# Patient Record
Sex: Female | Born: 1935 | Race: Black or African American | Hispanic: No | Smoking: Never smoker
Health system: Southern US, Community
[De-identification: ages and names within clinical notes are randomized; demographics above are authoritative.]

## PROBLEM LIST (undated history)

## (undated) DIAGNOSIS — D649 Anemia, unspecified: Secondary | ICD-10-CM

## (undated) DIAGNOSIS — J449 Chronic obstructive pulmonary disease, unspecified: Secondary | ICD-10-CM

## (undated) DIAGNOSIS — N200 Calculus of kidney: Secondary | ICD-10-CM

## (undated) DIAGNOSIS — I10 Essential (primary) hypertension: Secondary | ICD-10-CM

## (undated) DIAGNOSIS — R7303 Prediabetes: Secondary | ICD-10-CM

## (undated) DIAGNOSIS — E785 Hyperlipidemia, unspecified: Secondary | ICD-10-CM

## (undated) DIAGNOSIS — R011 Cardiac murmur, unspecified: Secondary | ICD-10-CM

## (undated) DIAGNOSIS — I509 Heart failure, unspecified: Secondary | ICD-10-CM

## (undated) DIAGNOSIS — K219 Gastro-esophageal reflux disease without esophagitis: Secondary | ICD-10-CM

## (undated) DIAGNOSIS — G4730 Sleep apnea, unspecified: Secondary | ICD-10-CM

## (undated) DIAGNOSIS — I499 Cardiac arrhythmia, unspecified: Secondary | ICD-10-CM

## (undated) DIAGNOSIS — M545 Low back pain, unspecified: Secondary | ICD-10-CM

## (undated) DIAGNOSIS — M1990 Unspecified osteoarthritis, unspecified site: Secondary | ICD-10-CM

## (undated) DIAGNOSIS — G459 Transient cerebral ischemic attack, unspecified: Secondary | ICD-10-CM

## (undated) DIAGNOSIS — G5600 Carpal tunnel syndrome, unspecified upper limb: Secondary | ICD-10-CM

## (undated) HISTORY — DX: Hyperlipidemia, unspecified: E78.5

## (undated) HISTORY — DX: Anemia, unspecified: D64.9

## (undated) HISTORY — DX: Unspecified osteoarthritis, unspecified site: M19.90

## (undated) HISTORY — DX: Cardiac murmur, unspecified: R01.1

## (undated) HISTORY — DX: Low back pain, unspecified: M54.50

## (undated) HISTORY — DX: Heart failure, unspecified: I50.9

## (undated) HISTORY — PX: RENAL ARTERY STENT: SHX2321

## (undated) HISTORY — PX: CARDIAC CATHETERIZATION: SHX172

## (undated) HISTORY — DX: Calculus of kidney: N20.0

## (undated) HISTORY — DX: Sleep apnea, unspecified: G47.30

## (undated) HISTORY — PX: REPLACEMENT TOTAL KNEE BILATERAL: SUR1225

## (undated) HISTORY — DX: Gastro-esophageal reflux disease without esophagitis: K21.9

## (undated) HISTORY — DX: Transient cerebral ischemic attack, unspecified: G45.9

## (undated) HISTORY — PX: TONSILLECTOMY: SUR1361

## (undated) HISTORY — PX: BACK SURGERY: SHX140

## (undated) HISTORY — DX: Essential (primary) hypertension: I10

## (undated) HISTORY — DX: Cardiac arrhythmia, unspecified: I49.9

## (undated) HISTORY — DX: Low back pain: M54.5

## (undated) HISTORY — DX: Carpal tunnel syndrome, unspecified upper limb: G56.00

## (undated) HISTORY — PX: ABDOMINAL HYSTERECTOMY: SHX81

---

## 2004-04-08 ENCOUNTER — Other Ambulatory Visit: Payer: Self-pay

## 2005-01-21 ENCOUNTER — Ambulatory Visit: Payer: Self-pay

## 2005-03-23 ENCOUNTER — Other Ambulatory Visit: Payer: Self-pay

## 2005-03-23 ENCOUNTER — Emergency Department: Payer: Self-pay | Admitting: Emergency Medicine

## 2006-01-17 ENCOUNTER — Other Ambulatory Visit: Payer: Self-pay

## 2006-01-17 ENCOUNTER — Emergency Department: Payer: Self-pay | Admitting: Unknown Physician Specialty

## 2006-05-18 ENCOUNTER — Ambulatory Visit: Payer: Self-pay | Admitting: Otolaryngology

## 2006-06-02 ENCOUNTER — Emergency Department: Payer: Self-pay | Admitting: Emergency Medicine

## 2006-06-16 ENCOUNTER — Ambulatory Visit: Payer: Self-pay | Admitting: Internal Medicine

## 2006-06-19 ENCOUNTER — Ambulatory Visit: Payer: Self-pay | Admitting: Internal Medicine

## 2006-07-10 ENCOUNTER — Ambulatory Visit: Payer: Self-pay | Admitting: Vascular Surgery

## 2006-08-07 ENCOUNTER — Ambulatory Visit: Payer: Self-pay | Admitting: Gastroenterology

## 2006-09-25 ENCOUNTER — Ambulatory Visit: Payer: Self-pay | Admitting: Gastroenterology

## 2007-02-05 ENCOUNTER — Ambulatory Visit: Payer: Self-pay | Admitting: Vascular Surgery

## 2007-03-17 ENCOUNTER — Emergency Department: Payer: Self-pay | Admitting: General Practice

## 2007-08-21 ENCOUNTER — Ambulatory Visit: Payer: Self-pay | Admitting: General Practice

## 2007-10-15 ENCOUNTER — Ambulatory Visit: Payer: Self-pay | Admitting: Pain Medicine

## 2007-10-30 ENCOUNTER — Ambulatory Visit: Payer: Self-pay | Admitting: Pain Medicine

## 2007-11-13 ENCOUNTER — Ambulatory Visit: Payer: Self-pay | Admitting: Physician Assistant

## 2007-12-03 ENCOUNTER — Ambulatory Visit: Payer: Self-pay | Admitting: Pain Medicine

## 2007-12-11 ENCOUNTER — Ambulatory Visit: Payer: Self-pay | Admitting: Pain Medicine

## 2008-01-10 ENCOUNTER — Ambulatory Visit: Payer: Self-pay | Admitting: Pain Medicine

## 2008-01-29 ENCOUNTER — Ambulatory Visit: Payer: Self-pay | Admitting: Physician Assistant

## 2008-02-05 ENCOUNTER — Ambulatory Visit: Payer: Self-pay | Admitting: Family Medicine

## 2008-05-05 ENCOUNTER — Other Ambulatory Visit: Payer: Self-pay

## 2008-05-05 ENCOUNTER — Emergency Department: Payer: Self-pay | Admitting: Emergency Medicine

## 2009-04-15 ENCOUNTER — Inpatient Hospital Stay: Payer: Self-pay | Admitting: *Deleted

## 2009-08-27 ENCOUNTER — Inpatient Hospital Stay: Payer: Self-pay | Admitting: Specialist

## 2009-09-23 ENCOUNTER — Inpatient Hospital Stay: Payer: Self-pay | Admitting: Internal Medicine

## 2009-09-27 ENCOUNTER — Inpatient Hospital Stay: Payer: Self-pay | Admitting: Internal Medicine

## 2009-09-27 ENCOUNTER — Ambulatory Visit: Payer: Self-pay | Admitting: Internal Medicine

## 2009-10-30 ENCOUNTER — Other Ambulatory Visit: Payer: Self-pay | Admitting: Internal Medicine

## 2009-11-14 HISTORY — PX: OTHER SURGICAL HISTORY: SHX169

## 2009-12-15 ENCOUNTER — Ambulatory Visit: Payer: Self-pay | Admitting: Family Medicine

## 2010-01-05 ENCOUNTER — Ambulatory Visit (HOSPITAL_COMMUNITY): Admission: RE | Admit: 2010-01-05 | Discharge: 2010-01-05 | Payer: Self-pay | Admitting: Neurosurgery

## 2010-01-05 ENCOUNTER — Encounter (INDEPENDENT_AMBULATORY_CARE_PROVIDER_SITE_OTHER): Payer: Self-pay | Admitting: Neurosurgery

## 2010-01-08 ENCOUNTER — Encounter: Admission: RE | Admit: 2010-01-08 | Discharge: 2010-01-08 | Payer: Self-pay | Admitting: Neurology

## 2010-06-26 ENCOUNTER — Emergency Department: Payer: Self-pay | Admitting: Emergency Medicine

## 2010-06-29 ENCOUNTER — Emergency Department: Payer: Self-pay | Admitting: Emergency Medicine

## 2010-09-29 ENCOUNTER — Ambulatory Visit: Payer: Self-pay | Admitting: Family Medicine

## 2010-10-06 ENCOUNTER — Ambulatory Visit: Payer: Self-pay | Admitting: Family Medicine

## 2010-11-09 ENCOUNTER — Ambulatory Visit: Payer: Self-pay | Admitting: Surgery

## 2010-11-11 LAB — PATHOLOGY REPORT

## 2010-12-05 ENCOUNTER — Encounter: Payer: Self-pay | Admitting: Neurology

## 2011-02-03 LAB — BASIC METABOLIC PANEL
BUN: 17 mg/dL (ref 6–23)
CO2: 27 mEq/L (ref 19–32)
Calcium: 5.9 mg/dL — CL (ref 8.4–10.5)
Chloride: 106 mEq/L (ref 96–112)
GFR calc Af Amer: 50 mL/min — ABNORMAL LOW (ref >60)
GFR calc non Af Amer: 41 mL/min — ABNORMAL LOW (ref >60)
Glucose, Bld: 96 mg/dL (ref 70–99)
Potassium: 3.7 mEq/L (ref 3.5–5.1)
Potassium: 4.4 mEq/L (ref 3.5–5.1)
Sodium: 156 mEq/L — ABNORMAL HIGH (ref 135–145)

## 2011-02-03 LAB — CBC
MCHC: 33.4 g/dL (ref 30.0–36.0)
RBC: 3.67 MIL/uL — ABNORMAL LOW (ref 3.87–5.11)
WBC: 6.5 10*3/uL (ref 4.0–10.5)

## 2011-02-03 LAB — SURGICAL PCR SCREEN: Staphylococcus aureus: NEGATIVE

## 2011-07-01 ENCOUNTER — Other Ambulatory Visit: Payer: Self-pay | Admitting: Neurology

## 2011-07-01 DIAGNOSIS — R0989 Other specified symptoms and signs involving the circulatory and respiratory systems: Secondary | ICD-10-CM

## 2011-07-01 DIAGNOSIS — R51 Headache: Secondary | ICD-10-CM

## 2011-07-09 ENCOUNTER — Ambulatory Visit
Admission: RE | Admit: 2011-07-09 | Discharge: 2011-07-09 | Disposition: A | Discharge status: Home / Self Care | Payer: Medicare Other | Source: Ambulatory Visit | Attending: Neurology | Admitting: Neurology

## 2011-07-09 DIAGNOSIS — R51 Headache: Secondary | ICD-10-CM

## 2011-07-09 DIAGNOSIS — R0989 Other specified symptoms and signs involving the circulatory and respiratory systems: Secondary | ICD-10-CM

## 2011-08-05 ENCOUNTER — Encounter: Payer: Self-pay | Admitting: Cardiovascular Disease

## 2011-08-05 ENCOUNTER — Ambulatory Visit (INDEPENDENT_AMBULATORY_CARE_PROVIDER_SITE_OTHER): Payer: Medicare Other | Admitting: Cardiovascular Disease

## 2011-08-05 VITALS — BP 142/70 | HR 60 | Ht 64.0 in | Wt 213.0 lb

## 2011-08-05 DIAGNOSIS — E785 Hyperlipidemia, unspecified: Secondary | ICD-10-CM

## 2011-08-05 DIAGNOSIS — R0602 Shortness of breath: Secondary | ICD-10-CM

## 2011-08-05 DIAGNOSIS — I739 Peripheral vascular disease, unspecified: Secondary | ICD-10-CM

## 2011-08-05 DIAGNOSIS — I10 Essential (primary) hypertension: Secondary | ICD-10-CM

## 2011-08-05 DIAGNOSIS — I2510 Atherosclerotic heart disease of native coronary artery without angina pectoris: Secondary | ICD-10-CM

## 2011-08-05 MED ORDER — HYDRALAZINE HCL 50 MG PO TABS: 50.0000 mg | ORAL_TABLET | Freq: Three times a day (TID) | ORAL | Status: DC

## 2011-08-05 MED ORDER — LOSARTAN POTASSIUM 100 MG PO TABS: 100.0000 mg | ORAL_TABLET | Freq: Every day | ORAL | Status: DC

## 2011-08-05 MED ORDER — HYDROCHLOROTHIAZIDE 25 MG PO TABS: 25.0000 mg | ORAL_TABLET | Freq: Every day | ORAL | Status: DC

## 2011-08-05 NOTE — Assessment & Plan Note (Signed)
She does have some shortness of breath. This could be secondary to underlying deconditioning. Pulmonary pressures are not very elevated. Normal LV function. She likely has underlying diastolic dysfunction from her history of hypertension and underlying valve disease. No systolic dysfunction. She does report having CHF. Uncertain where this came from her she potentially does have mild diastolic CHF, improved with diuretic.

## 2011-08-05 NOTE — Patient Instructions (Signed)
You are doing well. Please take HCTZ every day Take losartan 1/2 every day. If blood pressure runs high, take a full losartan every day. For emergency, blood pressure greater than 160, take a 1/2 or full hydralazine pill Please call us if you have new issues that need to be addressed before your next appt.  We will call you for a follow up Appt. In 2 months

## 2011-08-05 NOTE — Assessment & Plan Note (Signed)
She tends to adjust her medications as she sees appropriate. She is not on amlodipine for 2 months. She takes losartan HCT sometimes every other day because her diastolic pressure is low. She feels she needs a diuretic daily but she does have polyuria. We have suggested we change the losartan HCT combination pill to a separate pill for each that she can adjust. We have suggested she stay on HCTZ daily. She will likely have mild fluid retention given her underlying mild valve heart disease. We have suggested she try losartan daily one half pill and adjust upwards to a full pill as needed for hypertension .  We have also given her an emergency pill for systolic pressure greater than 160. She can take hydralazine 50 mg either half pill or full pill depending on her pressure.

## 2011-08-05 NOTE — Assessment & Plan Note (Signed)
Notes indicate coronary artery disease. We'll try to obtain the records for our system.

## 2011-08-05 NOTE — Assessment & Plan Note (Signed)
We will try to obtain records of her renal artery stent. If in fact she does have peripheral vascular disease, we would encourage her to start a cholesterol medication.

## 2011-08-05 NOTE — Progress Notes (Signed)
Patient ID: Rose [name], female    DOB: [birth year], 77 y.o.   MRN: [id number]  HPI Comments: Rose [name] is a very pleasant 77 year old woman, patient of Dr. Burnett Sheng, with a history of hypertension, migraines, hyperlipidemia, sleep apnea, mild aortic valve stenosis with murmur, atrial fibrillation with ablation by Dr. Maisie Fus at [facility] in January 2011, renal artery stent who presents by referral for evaluation of her headaches and blood pressure.  Notes indicate she was evaluated by Dr. Lady Gary and had a stress test that showed no ischemia with normal perfusion, echocardiogram that showed normal LV systolic function with mild pulmonary hypertension, mild mitral valve stenosis, mild aortic valve insufficiency  ( gradient across the mitral valve was not recorded. There was minimal gradient across the aortic valve with mean pressure of 6 mmHg, aortic valve area estimated at 1.2 cm consistent with at least mild, possibly moderate aortic valve stenosis)  She reports that her blood pressure has been labile. She takes her losartan HCT sometimes every other day as she is worried about her low diastolic pressure. She likes to take the HCTZ every day as she feels she has congestive heart failure. Over the past week, her blood pressure has been well controlled with systolic pressures in the 130 range. She stopped her Norvasc 2 months ago and is uncertain if she told Dr. Burnett Sheng but this was discontinued. She has not been taking Topamax for migraines and this did not make her feel well. She is concerned when her blood pressure climbs. Sometimes blood pressure was elevated when she has a headache. She is not very active at baseline.  EKG shows normal sinus rhythm with rate 60 beats per minute with no significant ST or T wave changes   Outpatient Encounter Prescriptions as of 08/05/2011  Medication Sig Dispense Refill  . atenolol (TENORMIN) 50 MG tablet Take 50 mg by mouth 2 (two) times daily.        Marland Kitchen omeprazole  (PRILOSEC) 40 MG capsule Take 40 mg by mouth 2 (two) times daily.        Marland Kitchen  losartan-hydrochlorothiazide (HYZAAR) 100-25 MG per tablet Take 1 tablet by mouth daily.        . hydrALAZINE (APRESOLINE) 50 MG tablet Take 1 tablet (50 mg total) by mouth 3 (three) times daily.  30 tablet  6  . DISCONTD: amLODipine (NORVASC) 5 MG tablet Take 5 mg by mouth daily.           Review of Systems  Constitutional: Positive for fatigue.  Eyes: Negative.   Respiratory: Negative.   Cardiovascular: Negative.   Gastrointestinal: Negative.   Musculoskeletal: Negative.   Skin: Negative.   Neurological: Positive for dizziness and headaches.  Hematological: Negative.   Psychiatric/Behavioral: Negative.   All other systems reviewed and are negative.    BP 142/70  Pulse 60  Ht 5\' 4"  (1.626 m)  Wt 213 lb (96.616 kg)  BMI 36.56 kg/m2  Physical Exam  Nursing note and vitals reviewed. Constitutional: She is oriented to person, place, and time. She appears well-developed and well-nourished.  HENT:  Head: Normocephalic.  Nose: Nose normal.  Mouth/Throat: Oropharynx is clear and moist.  Eyes: Conjunctivae are normal. Pupils are equal, round, and reactive to light.  Neck: Normal range of motion. Neck supple. No JVD present.  Cardiovascular: Normal rate, regular rhythm, S1 normal, S2 normal and intact distal pulses.  Exam reveals no gallop and no friction rub.   Murmur heard.  Crescendo systolic murmur is present  with a grade of 2/6  Pulmonary/Chest: Effort normal and breath sounds normal. No respiratory distress. She has no wheezes. She has no rales. She exhibits no tenderness.  Abdominal: Soft. Bowel sounds are normal. She exhibits no distension. There is no tenderness.  Musculoskeletal: Normal range of motion. She exhibits no edema and no tenderness.  Lymphadenopathy:    She has no cervical adenopathy.  Neurological: She is alert and oriented to person, place, and time. Coordination normal.  Skin: Skin  is warm and dry. No rash noted. No erythema.  Psychiatric: She has a normal mood and affect. Her behavior is normal. Judgment and thought content normal.         Assessment and Plan

## 2011-08-05 NOTE — Assessment & Plan Note (Signed)
Per the notes, cholesterol is elevated. We'll discuss this with her next time whether she will benefit from a low-dose cholesterol medication.

## 2011-08-18 ENCOUNTER — Encounter: Payer: Self-pay | Admitting: Cardiovascular Disease

## 2011-10-10 ENCOUNTER — Ambulatory Visit: Payer: Medicare Other | Admitting: Cardiovascular Disease

## 2011-10-24 ENCOUNTER — Ambulatory Visit: Payer: Medicare Other | Admitting: Cardiovascular Disease

## 2011-12-12 ENCOUNTER — Ambulatory Visit: Payer: Self-pay | Admitting: Internal Medicine

## 2013-05-18 ENCOUNTER — Observation Stay: Payer: Self-pay | Admitting: Family Medicine

## 2013-05-18 LAB — URINALYSIS, COMPLETE
Blood: NEGATIVE
Glucose,UR: NEGATIVE mg/dL (ref 0–75)
Ketone: NEGATIVE
Nitrite: NEGATIVE
Ph: 5 (ref 4.5–8.0)
Protein: NEGATIVE
RBC,UR: <1 /HPF (ref 0–5)

## 2013-05-18 LAB — COMPREHENSIVE METABOLIC PANEL
Albumin: 3.4 g/dL (ref 3.4–5.0)
BUN: 15 mg/dL (ref 7–18)
Bilirubin,Total: 0.4 mg/dL (ref 0.2–1.0)
Calcium, Total: 9.1 mg/dL (ref 8.5–10.1)
Creatinine: 0.91 mg/dL (ref 0.60–1.30)
EGFR (Non-African Amer.): 59 — ABNORMAL LOW
Glucose: 110 mg/dL — ABNORMAL HIGH (ref 65–99)
Osmolality: 272 (ref 275–301)
SGOT(AST): 57 U/L — ABNORMAL HIGH (ref 15–37)
SGPT (ALT): 31 U/L (ref 12–78)

## 2013-05-18 LAB — CBC
MCH: 28.1 pg (ref 26.0–34.0)
MCHC: 33.6 g/dL (ref 32.0–36.0)
MCV: 84 fL (ref 80–100)
RBC: 4.23 10*6/uL (ref 3.80–5.20)
RDW: 15.1 % — ABNORMAL HIGH (ref 11.5–14.5)

## 2013-05-18 LAB — TROPONIN I: Troponin-I: <0.02 ng/mL

## 2013-05-18 LAB — PRO B NATRIURETIC PEPTIDE: B-Type Natriuretic Peptide: 2014 pg/mL — ABNORMAL HIGH (ref 0–450)

## 2013-05-19 LAB — CK TOTAL AND CKMB (NOT AT ARMC): CK, Total: 115 U/L (ref 21–215)

## 2013-05-19 LAB — BASIC METABOLIC PANEL
Calcium, Total: 8.6 mg/dL (ref 8.5–10.1)
EGFR (African American): 49 — ABNORMAL LOW
Potassium: 3.8 mmol/L (ref 3.5–5.1)
Sodium: 140 mmol/L (ref 136–145)

## 2013-05-19 LAB — TROPONIN I: Troponin-I: <0.02 ng/mL

## 2013-10-07 ENCOUNTER — Emergency Department: Payer: Self-pay | Admitting: Emergency Medicine

## 2013-10-07 LAB — URINALYSIS, COMPLETE
Bilirubin,UR: NEGATIVE
Blood: NEGATIVE
Glucose,UR: NEGATIVE mg/dL (ref 0–75)
Leukocyte Esterase: NEGATIVE
Ph: 5 (ref 4.5–8.0)
RBC,UR: 1 /HPF (ref 0–5)
Squamous Epithelial: 4

## 2013-10-07 LAB — BASIC METABOLIC PANEL
Creatinine: 0.91 mg/dL (ref 0.60–1.30)
EGFR (African American): >60
EGFR (Non-African Amer.): 59 — ABNORMAL LOW
Osmolality: 279 (ref 275–301)
Potassium: 3.6 mmol/L (ref 3.5–5.1)
Sodium: 138 mmol/L (ref 136–145)

## 2013-10-07 LAB — CBC
HCT: 35.5 % (ref 35.0–47.0)
HGB: 11.8 g/dL — ABNORMAL LOW (ref 12.0–16.0)
MCH: 27.4 pg (ref 26.0–34.0)
MCHC: 33.1 g/dL (ref 32.0–36.0)
Platelet: 198 10*3/uL (ref 150–440)
RBC: 4.28 10*6/uL (ref 3.80–5.20)
RDW: 15.3 % — ABNORMAL HIGH (ref 11.5–14.5)

## 2013-10-07 LAB — PRO B NATRIURETIC PEPTIDE: B-Type Natriuretic Peptide: 1798 pg/mL — ABNORMAL HIGH (ref 0–450)

## 2013-10-09 LAB — URINE CULTURE

## 2014-04-16 ENCOUNTER — Ambulatory Visit: Payer: Self-pay | Admitting: Family Medicine

## 2014-04-18 ENCOUNTER — Ambulatory Visit: Payer: Self-pay | Admitting: Family Medicine

## 2014-06-12 ENCOUNTER — Emergency Department: Payer: Self-pay | Admitting: Emergency Medicine

## 2014-06-12 LAB — CBC
HCT: 36.4 % (ref 35.0–47.0)
HGB: 11.6 g/dL — ABNORMAL LOW (ref 12.0–16.0)
MCH: 27.3 pg (ref 26.0–34.0)
MCHC: 31.8 g/dL — ABNORMAL LOW (ref 32.0–36.0)
MCV: 86 fL (ref 80–100)
Platelet: 212 10*3/uL (ref 150–440)
RBC: 4.24 10*6/uL (ref 3.80–5.20)
RDW: 14.7 % — ABNORMAL HIGH (ref 11.5–14.5)
WBC: 8.4 10*3/uL (ref 3.6–11.0)

## 2014-06-12 LAB — COMPREHENSIVE METABOLIC PANEL
ALK PHOS: 63 U/L
ALT: 21 U/L
Albumin: 3.3 g/dL — ABNORMAL LOW (ref 3.4–5.0)
Anion Gap: 7 (ref 7–16)
BUN: 20 mg/dL — AB (ref 7–18)
Bilirubin,Total: 0.2 mg/dL (ref 0.2–1.0)
CHLORIDE: 105 mmol/L (ref 98–107)
CO2: 26 mmol/L (ref 21–32)
CREATININE: 1.21 mg/dL (ref 0.60–1.30)
Calcium, Total: 8.4 mg/dL — ABNORMAL LOW (ref 8.5–10.1)
EGFR (African American): 48 — ABNORMAL LOW
EGFR (Non-African Amer.): 42 — ABNORMAL LOW
Glucose: 139 mg/dL — ABNORMAL HIGH (ref 65–99)
Osmolality: 281 (ref 275–301)
POTASSIUM: 3.7 mmol/L (ref 3.5–5.1)
SGOT(AST): 25 U/L (ref 15–37)
Sodium: 138 mmol/L (ref 136–145)
Total Protein: 7.5 g/dL (ref 6.4–8.2)

## 2014-06-12 LAB — PROTIME-INR
INR: 1
Prothrombin Time: 12.8 secs (ref 11.5–14.7)

## 2014-06-12 LAB — LIPASE, BLOOD: Lipase: 118 U/L (ref 73–393)

## 2014-06-12 LAB — TROPONIN I

## 2014-06-24 ENCOUNTER — Ambulatory Visit: Payer: Self-pay | Admitting: Gastroenterology

## 2014-12-13 ENCOUNTER — Emergency Department: Payer: Self-pay | Admitting: Internal Medicine

## 2014-12-13 LAB — CBC WITH DIFFERENTIAL/PLATELET
Basophil #: 0 10*3/uL (ref 0.0–0.1)
Basophil %: 0.3 %
EOS PCT: 1.1 %
Eosinophil #: 0.1 10*3/uL (ref 0.0–0.7)
HCT: 33.8 % — ABNORMAL LOW (ref 35.0–47.0)
HGB: 10.8 g/dL — ABNORMAL LOW (ref 12.0–16.0)
Lymphocyte #: 1.2 10*3/uL (ref 1.0–3.6)
Lymphocyte %: 17.7 %
MCH: 27 pg (ref 26.0–34.0)
MCHC: 32 g/dL (ref 32.0–36.0)
MCV: 85 fL (ref 80–100)
MONO ABS: 0.4 x10 3/mm (ref 0.2–0.9)
Monocyte %: 5.7 %
Neutrophil #: 5.3 10*3/uL (ref 1.4–6.5)
Neutrophil %: 75.2 %
PLATELETS: 199 10*3/uL (ref 150–440)
RBC: 4 10*6/uL (ref 3.80–5.20)
RDW: 15.4 % — AB (ref 11.5–14.5)
WBC: 7 10*3/uL (ref 3.6–11.0)

## 2014-12-13 LAB — COMPREHENSIVE METABOLIC PANEL
ANION GAP: 4 — AB (ref 7–16)
AST: 22 U/L (ref 15–37)
Albumin: 3.4 g/dL (ref 3.4–5.0)
Alkaline Phosphatase: 54 U/L (ref 46–116)
BUN: 19 mg/dL — ABNORMAL HIGH (ref 7–18)
Bilirubin,Total: 0.3 mg/dL (ref 0.2–1.0)
CALCIUM: 9 mg/dL (ref 8.5–10.1)
CHLORIDE: 103 mmol/L (ref 98–107)
Co2: 31 mmol/L (ref 21–32)
Creatinine: 1.21 mg/dL (ref 0.60–1.30)
EGFR (Non-African Amer.): 45 — ABNORMAL LOW
GFR CALC AF AMER: 55 — AB
GLUCOSE: 135 mg/dL — AB (ref 65–99)
Osmolality: 280 (ref 275–301)
Potassium: 4.3 mmol/L (ref 3.5–5.1)
SGPT (ALT): 21 U/L (ref 14–63)
Sodium: 138 mmol/L (ref 136–145)
Total Protein: 7.1 g/dL (ref 6.4–8.2)

## 2014-12-13 LAB — URINALYSIS, COMPLETE
BLOOD: NEGATIVE
Bilirubin,UR: NEGATIVE
Glucose,UR: NEGATIVE mg/dL (ref 0–75)
KETONE: NEGATIVE
Leukocyte Esterase: NEGATIVE
Nitrite: NEGATIVE
PH: 6 (ref 4.5–8.0)
Protein: NEGATIVE
RBC,UR: 1 /HPF (ref 0–5)
Specific Gravity: 1.012 (ref 1.003–1.030)
Squamous Epithelial: 2
WBC UR: 1 /HPF (ref 0–5)

## 2014-12-13 LAB — TROPONIN I: Troponin-I: <0.02 ng/mL

## 2014-12-13 LAB — LIPASE, BLOOD: Lipase: 78 U/L (ref 73–393)

## 2015-02-04 ENCOUNTER — Observation Stay: Payer: Self-pay | Admitting: Internal Medicine

## 2015-02-04 LAB — TROPONIN I
Troponin-I: <0.03 ng/mL
Troponin-I: <0.03 ng/mL

## 2015-02-05 LAB — CBC WITH DIFFERENTIAL/PLATELET
BASOS PCT: 0.3 %
Basophil #: 0 10*3/uL (ref 0.0–0.1)
EOS ABS: 0.1 10*3/uL (ref 0.0–0.7)
EOS PCT: 1.8 %
HCT: 34 % — AB (ref 35.0–47.0)
HGB: 10.9 g/dL — AB (ref 12.0–16.0)
Lymphocyte #: 1.6 10*3/uL (ref 1.0–3.6)
Lymphocyte %: 22.4 %
MCH: 27.1 pg (ref 26.0–34.0)
MCHC: 32.1 g/dL (ref 32.0–36.0)
MCV: 84 fL (ref 80–100)
MONO ABS: 0.5 x10 3/mm (ref 0.2–0.9)
Monocyte %: 7.2 %
NEUTROS ABS: 4.9 10*3/uL (ref 1.4–6.5)
Neutrophil %: 68.3 %
Platelet: 189 10*3/uL (ref 150–440)
RBC: 4.03 10*6/uL (ref 3.80–5.20)
RDW: 15.3 % — ABNORMAL HIGH (ref 11.5–14.5)
WBC: 7.2 10*3/uL (ref 3.6–11.0)

## 2015-02-05 LAB — BASIC METABOLIC PANEL
ANION GAP: 7 (ref 7–16)
BUN: 23 mg/dL — AB
CALCIUM: 9.1 mg/dL
Chloride: 101 mmol/L
Co2: 31 mmol/L
Creatinine: 1.05 mg/dL — ABNORMAL HIGH
EGFR (Non-African Amer.): 49 — ABNORMAL LOW
GFR CALC AF AMER: 57 — AB
GLUCOSE: 92 mg/dL
Potassium: 4.2 mmol/L
SODIUM: 139 mmol/L

## 2015-02-05 LAB — PROTIME-INR
INR: 1
PROTHROMBIN TIME: 13.8 s

## 2015-02-11 ENCOUNTER — Ambulatory Visit
Admit: 2015-02-11 | Disposition: A | Discharge status: Home / Self Care | Payer: Self-pay | Attending: Nurse Practitioner | Admitting: Nurse Practitioner

## 2015-03-06 NOTE — H&P (Signed)
PATIENT NAME:  Rose [name], Rose [name] MR#:  [id number] DATE OF BIRTH:  16-Jan-1930  DATE OF ADMISSION:  05/18/2013  PRIMARY CARE PHYSICIAN: Rhona Leavens. Burnett Sheng, MD  CHIEF COMPLAINT: Shortness of breath and chest pain.   HISTORY OF PRESENT ILLNESS: This is an 81 year old female who presents to the hospital with some progressive shortness of breath, getting worse over the past 3 days, along with some intermittent chest pain. The patient says that she has been short of breath where she has not been able to lie down flat to sleep over the past couple days. She does not complain of any lower extremity swelling or edema and cannot tell me if she truly has had any weight gain. She also complains of some intermittent chest pain with her shortness of breath. The chest pain is substernal in nature, nonradiating. Presently, she is chest pain-free. She denies any nausea or vomiting, any dizziness, any syncope, any palpitations or any other associated symptoms presently. The patient, in the ER, had a chest x-ray, and the findings are consistent with some pulmonary edema and some mild CHF. Hospitalist services were contacted for further treatment and evaluation.   REVIEW OF SYSTEMS:  CONSTITUTIONAL: No documented fever. No weight gain, no weight loss.  EYES: No blurry or double vision.  ENT: No tinnitus. No postnasal drip. No redness of the oropharynx.  RESPIRATORY: No cough, no wheeze. No hemoptysis. Positive dyspnea.  CARDIOVASCULAR: Positive chest pain. No orthopnea, no palpitations, no syncope.  GASTROINTESTINAL: No nausea, no vomiting, no diarrhea. No abdominal pain. No melena or hematochezia.  GENITOURINARY: No dysuria, no hematuria.  ENDOCRINE: No polyuria or nocturia. No heat or cold intolerance.  HEMATOLOGIC: No anemia, no bruising, no bleeding.  INTEGUMENTARY: No rashes, no lesions.  MUSCULOSKELETAL: No arthritis, no swelling, no gout.  NEUROLOGIC: No numbness or tingling. No ataxia. No seizure-type activity.   PSYCHIATRIC: No anxiety, no insomnia, no ADD.   PAST MEDICAL HISTORY: Consistent with history of diastolic congestive heart failure, hypertension, GERD.   ALLERGIES: LISINOPRIL, CRESTOR, AVAPRO, PRAVACHOL AND OTHER STATINS AND ULTRAM.   SOCIAL HISTORY: No smoking. No alcohol abuse. No illicit drug abuse. Lives at home with her son.   FAMILY HISTORY: Mother died from natural causes. Father had diabetes.   CURRENT MEDICATIONS:  1. Atenolol 50 mg b.i.d. 2. Aspirin 81 mg daily.  3. Garlic 1000 mg daily.  4. Hydrochlorothiazide 25 mg daily.  5. Omeprazole 40 mg daily.  6. Oxygen at night 7. Vitamin B12 one tab daily.  8. Vitamin C 500 mg daily.  9. Vitamin D2 at 50,000 international units weekly. 10. Zinc 50 mg daily.   PHYSICAL EXAMINATION: Presently, is as follows: VITAL SIGNS: Temperature is 98.4, pulse 64, respirations 18, blood pressure 177/71, saturation is 98% on room air.  GENERAL: The patient is a pleasant-appearing female in no apparent distress.  HEAD, EYES, EARS, NOSE AND THROAT: She is atraumatic, normocephalic. Extraocular muscles are intact. Pupils equal and reactive to light. Sclerae anicteric. No conjunctival injection. No pharyngeal erythema.  NECK: Supple. No jugular venous distention, no bruits, no lymphadenopathy, no thyromegaly.  HEART: Regular rate and rhythm. She does have a 2/6 systolic ejection murmur heard at the right sternal border. No rubs. No clicks.  LUNGS: She has some coarse crackles at the bases bilaterally; otherwise, negative use of accessory muscles. No dullness to percussion. No rhonchi. No wheezing.  ABDOMEN: Soft, flat, nontender, nondistended. Has good bowel sounds. No hepatosplenomegaly appreciated.  EXTREMITIES: No evidence of any cyanosis or  clubbing. Does have trace peripheral edema bilaterally. Has +2 pedal and radial pulses bilaterally.  NEUROLOGICAL: The patient is alert, awake and oriented x3 with no focal motor or sensory deficits  appreciated bilaterally.  SKIN: Moist and warm, with no rash appreciated.  LYMPHATIC: There is no cervical or axillary lymphadenopathy.   LABORATORY DATA: Showed a serum glucose of 110, BUN 15, creatinine 0.9, sodium 135, potassium 5.4, chloride 104, bicarbonate 25. LFTs are within normal limits. Troponin less than 0.02. White cell count 8.3, hemoglobin 11.9, hematocrit 35.4, platelet count 208. Urinalysis within normal limits. EKG shows normal sinus rhythm with normal axis and no evidence of any acute ST or T wave changes. Chest x-ray showed interstitial opacities consistent with interstitial edema.   ASSESSMENT AND PLAN: This is an 81 year old female with history of congestive heart failure, hypertension and gastroesophageal reflux disease, who presents to the hospital with progressive shortness of breath, some orthopnea and chest pain.   1. Congestive heart failure. This is likely the cause of the patient's orthopnea and shortness of breath. Will diurese the patient with IV Lasix. Follow I's and O's and daily weights. Continue her beta blockers. THE PATIENT IS ALLERGIC TO ACE INHIBITORS AS SHE GETS ANAPHYLAXIS. She follows up with Dr. Juliann Paresallwood from cardiology. If she clinically does not feel better, will consider consulting cardiology.  2. Chest pain. She does have significant risk factors for angina, although her chest pain is quite atypical. I will observe her overnight on telemetry. Follow serial cardiac markers. Continue aspirin, continue beta blocker. THE PATIENT IS CURRENTLY NOT ON STATINS AS SHE IS ALLERGIC TO THEM.  3. Hypertension. Continue atenolol. 4. Gastroesophageal reflux disease. Continue omeprazole.   CODE STATUS: The patient is a full code.   TIME SPENT ON ADMISSION: 45 minutes.   ____________________________ Rolly PancakeVivek J. Cherlynn KaiserSainani, MD vjs:OSi D: 05/18/2013 13:39:11 ET T: 05/18/2013 13:55:43 ET JOB#: [id number]  cc: Rolly PancakeVivek J. Cherlynn KaiserSainani, MD, <Dictator> Houston SirenVIVEK J Ashan Cueva  MD ELECTRONICALLY SIGNED 05/19/2013 16:45

## 2015-03-06 NOTE — Discharge Summary (Signed)
PATIENT NAME:  Rose [name], Rose L MR#:  [id number] DATE OF BIRTH:  19-Sep-1930  DATE OF ADMISSION:  05/18/2013 DATE OF DISCHARGE:  05/19/2013  REASON FOR ADMISSION: Shortness of breath and chest pain.   DISCHARGE DIAGNOSES: 1.  Congestive heart failure, acute exacerbation of diastolic dysfunction. Ejection fraction of  58% on previous stress test on 11/2011.  2.  Chest pain, secondary to congestive heart failure.  3.  Hypertension.  4.  Gastroesophageal reflux.  5.  Chronic kidney disease with glomerular filtration rate (GFR) around 40 to 50, stage III.  6.  Diastolic congestive heart failure exacerbation.   Important Results: BNP of 2000, glucose 110, sodium 135, potassium 5.4. At discharge potassium was 3.8.   AST is slightly elevated at 57. Troponins were negative x 3. CK 237 on admission, 150 on  discharge.  Hemoglobin 11.9 at admission.   White count 8.3, platelets 208.   UA negative for signs of urinary tract infection.   EKG: Normal sinus rhythm.   DISPOSITION: Home, with home health.   MEDICATIONS AT HOME: Home oxygen at night, 2 liters nasal cannula, vitamin B12 once a day, omeprazole 40 mg once a day, atenolol 50 mg twice daily, vitamin D 50,000 units once a week, garlic capsule once daily, vitamin C once daily, zinc once daily, aspirin 81 mg once daily, furosemide 10 mg p.o. once a day; take an extra one-half of the pill, another 10 mg if weight gain of more than 2 pounds in 1 day or more than 5 pounds in 1 week.   FOLLOWUP: With primary care physician, Dr. Tera MaterJim Hedrick in 1 to 2 weeks. Follow up with cardiologist, Dr. Juliann Paresallwood, also in 1 to 2 weeks.   Please Dr. Burnett ShengHedrick, check a kidney function on the patient on the followup appointment. The patient discharged with instructions of daily weights. Home health to help her understanding the CHF, preventing readmissions, modification of medications and also to increase ability to ambulate, and strength.   HOSPITAL COURSE: Rose [name]  is a very nice 81 year old female who has a history of congestive heart failure in the past. She was taking Lasix. The patient was taken off of it for awhile. She states that she had some progressive shortness of breath for the past 3 days prior to admission, with intermittent chest pains.   In the past she has had several stress tests done, the last one being on January 2013, which was negative for any reversible or irreversible defects.   The patient has been seen by Dr. Juliann Paresallwood in the past and these chest pains are apparently not necessarily cardiac or related to CHF in that sense.   The patient states her shortness of breath was worse whenever she was laying flat, for which  she had to sleep with several pillows. She noticed that she has increased swelling or edema of the lower extremities which she can tell, and she does not have a working scale and does not know if she has gained any weight. She is having intermittent chest pain with shortness of breath, mostly tightness-related, and they have been described as substernal, nonradiating, and they just come and go occasionally.   The patient was evaluated and admitted on her physical exam. She had a 2/6 systolic ejection murmur that has been there before. She had bilateral normal pedal pulses, with no significant cyanosis or clubbing. The patient had trace peripheral edema.   LABORATORY DATA: Her EKG was overall normal.   Chest x-ray showed interstitial  opacities consistent with interstitial edema. There was no ST elevation or depression on the EKG, and the cardiac enzymes were negative.   The patient was admitted with a plan to treat congestive heart failure exacerbation. The patient was given Lasix IV, and within the first couple of doses she noticed a significant difference.   She is taking a beta blocker that we continued. SHE HAS A HISTORY OF ANAPHYLAXIS WITH ACE INHIBITORS for which we did not prescribing any.   Her chest pain was mostly  related to the CHF and it did not occur again. Her cardiac enzymes were negative.   SHE IS ALLERGIC TO STATINS, which she cannot take.   For her hypertension she takes atenolol as a beta blocker, also congestive heart failure. She has a history of GERD that is well-controlled with omeprazole.   The patient was given medication as far as congestive heart failure. I spoke to the family, and she is being discharged in good condition on a small dose of Lasix.   I spent about 45 minutes with this patient.    ____________________________ Felipa Furnace, MD rsg:dm D: 05/20/2013 07:00:46 ET T: 05/20/2013 07:33:31 ET JOB#: [id number]  cc: Felipa Furnace, MD, <Dictator> Rhona Leavens. Burnett Sheng, MD Dwayne D. Juliann Pares, MD Regan Rakers Juanda Chance MD ELECTRONICALLY SIGNED 05/25/2013 16:38

## 2015-03-15 NOTE — Discharge Summary (Signed)
PATIENT NAME:  Rose [name], Rose [name] MR#:  [id number] DATE OF BIRTH:  09/15/1930  DATE OF ADMISSION:  02/04/2015 DATE OF DISCHARGE:  02/05/2015  REASON FOR ADMISSION:    1. Chest pain with no active cardiac disease on cardiac catheterization.  2. History of hypertension.   PROCEDURES: Cardiac catheterization showed coronary arteries essentially normal with minor irregularities.  No significant aortic or mitral valve stenosis. Catheter was done by Dr. Lady GaryFath. Ejection fraction was 60-65%.   FOLLOWUP:   With Dr. Juliann Paresallwood as outpatient.   MEDICATIONS AT DISCHARGE: 1. Atenolol 50 mg b.i.d.  2. Omeprazole 20 mg daily.  3. Prazosin 1 mg p.o. b.i.d.  4. Hydrochlorothiazide 25 mg p.o. daily.  5. Vitamin D3 at 1000 international units p.o. daily.  6. Cyclobenzaprine 5 mg, 1 tablet once a day at bedtime as needed for muscle spasms.  7. ProAir HFA 90 mcg per inhalation, 2 puffs inhaled every 4 hours as needed.  8. Aspirin 81 mg p.o. 3 times a week.  9. Garlic oral capsule once a day.  10. Multivitamin p.o. daily.   DIET: Low sodium, regular consistency.   PRIMARY CARE PHYSICIAN:  At [facility].   LABORATORY DATA AND IMAGING:   Cardiac enzymes x 3 were negative. Echo Doppler showed EF of 60-65% and mild mitral valve stenosis, mild  mitral valve regurgitation, thickening of anterior and posterior mitral valve leaflets, mild aortic regurgitation. Chest x-ray, stable exam. No evidence of acute cardiopulmonary disease.   ASSESSMENT:   Rose [name] is an 81 year old African American female with past medical history of hypertension, chronic kidney disease stage III, gastroesophageal reflux disease, came in with chest pain. She was admitted with:  1. Chest pain, which appears atypical but the patient does have risk factors of coronary artery disease and stress test had been negative in the past. She is scheduled for cardiac catheter, results as above were noted. The patient will continue her cardiac  medications as before.  Followup with Dr. Juliann Paresallwood as before.  2. Hypertension. Continue atenolol, hydrochlorothiazide.  3. History of congestive heart failure, clinically well compensated.  4. Gastroesophageal reflux disease. Continue Protonix.   Hospital stay otherwise remained stable.   CODE STATUS: The patient remained a full code.   TIME SPENT: Forty minutes.     ____________________________ Wylie HailSona A. Allena KatzPatel, MD sap:tr D: 02/06/2015 07:41:20 ET T: 02/06/2015 13:09:52 ET JOB#: [id number]  cc: Chloris Marcoux A. Allena KatzPatel, MD, <Dictator> Dwayne D. Juliann Paresallwood, MD [facility]  Willow OraSONA A Jesly Hartmann MD ELECTRONICALLY SIGNED 02/16/2015 12:24

## 2015-03-15 NOTE — H&P (Signed)
PATIENT NAME:  Rose [name], Rose [name] MR#:  [id number] DATE OF BIRTH:  1930-09-20  DATE OF ADMISSION:  02/04/2015  PRIMARY CARE PHYSICIAN: At the [facility].   CARDIOLOGIST: Dwayne D. Callwood, MD   CHIEF COMPLAINT: Chest pain.   HISTORY OF PRESENT ILLNESS: This is an 81 year old female who presents to the Emergency Room complaining of chest pain that began this morning. The patient says that she has not been feeling well now for the past few days. She describes just feeling more weak and malaise over the past few days. She went to see her primary care physician and was complaining to them about some atypical chest pain that she has also been having. They sent her to the ER for further evaluation. The patient describes the chest pain as being a dull ache in the center of her chest, nonradiating. Associated with some shortness of breath, but no nausea, no vomiting, no palpitations, no diaphoresis, no syncope. Hospitalist services were contacted for further treatment and evaluation.   REVIEW OF SYSTEMS: CONSTITUTIONAL: No documented fever. Positive fatigue and weakness. No weight gain. No weight loss.  EYES: No blurry or double vision.  ENT: No tinnitus. No postnasal drip. No redness of the oropharynx.  RESPIRATORY: No cough, no wheeze, no hemoptysis, no dyspnea.  CARDIOVASCULAR: Positive chest pain. No orthopnea. No palpitations. No syncope.  GASTROINTESTINAL: No nausea, no vomiting, diarrhea. No abdominal pain. No melena. No hematochezia.  GENITOURINARY: No dysuria or hematuria.  ENDOCRINE: No polyuria, nocturia, heat or cold intolerance.  HEMATOLOGIC: No anemia, no bruising, no bleeding.  INTEGUMENTARY: No rashes. No lesions.  MUSCULOSKELETAL: No arthritis. No swelling. No gout.  NEUROLOGIC: No numbness, tingling. No ataxia. No seizure-type activity.  PSYCHIATRIC: No anxiety. No insomnia. No ADD.   PAST MEDICAL HISTORY: Consistent with hypertension, chronic kidney disease stage III, GERD,  osteoarthritis, chronic back pain.   ALLERGIES: AVAPRO, CRESTOR, LISINOPRIL, PLAVIX, PRAVACHOL, PRILOSEC, ULTRAM, AND STATINS.   SOCIAL HISTORY: No smoking. No alcohol abuse. No illicit drug abuse. Lives at home with her son.   FAMILY HISTORY: Mother and father are both deceased. Mother died from natural causes. Father died from complications of diabetes.   CURRENT MEDICATIONS: As follows: Aspirin 81 mg 3 times a week, atenolol 50 mg b.i.d., Flexeril 5 mg at bedtime as needed, garlic supplement daily, hydrochlorothiazide 25 mg daily, multivitamin daily, omeprazole 20 mg a day, prazosin 1 mg b.i.d., albuterol inhaler 2 puffs every 4 hours as needed, vitamin D3 at 1000 international units daily.   PHYSICAL EXAMINATION: Presently is as follows:  VITAL SIGNS: Temperature is 98.2, pulse 63, respirations 12, blood pressure 136/61. Saturating 95% on room air.  GENERAL: The patient is a pleasant-appearing female but in no apparent distress.  HEAD, EYES, EARS, NOSE, THROAT: Atraumatic, normocephalic. Her extraocular muscles are intact. Her pupils are equal and reactive to light. Sclerae anicteric. No conjunctival injection. No pharyngeal erythema.  NECK: Supple. There is no jugular venous distention. No bruits, no lymphadenopathy, no thyromegaly.  HEART: Regular rate and rhythm. She does have a 2/6 systolic ejection murmur heard at the right sternal border. No rubs, no clicks.  LUNGS: Clear to auscultation bilaterally. No rales or rhonchi. No wheezes.  ABDOMEN: Soft, flat, nontender, nondistended. Has good bowel sounds. No hepatosplenomegaly appreciated.  EXTREMITIES: No evidence of any cyanosis, clubbing, or peripheral edema. Has +2 pedal and radial pulses bilaterally.  NEUROLOGIC: She is alert, awake, oriented x 3 with no focal motor or sensory deficits appreciated bilaterally.  SKIN: Moist  and warm with no rashes appreciated.  LYMPHATIC: There is no cervical or axillary lymphadenopathy.   LABORATORY  DATA: Serum glucose of 104, BUN is 25, creatinine 1.04, sodium 136, potassium 4.1, chloride 99, bicarbonate 31. The patient's LFTs are within normal limits. Troponin 0.01. The patient's white cell count is 8.4, hemoglobin 11.1, hematocrit 33.9, platelet count of 187,000.   ASSESSMENT AND PLAN: This is an 81 year old female with a history of hypertension, chronic kidney disease stage III, gastroesophageal reflux disease, osteoarthritis, chronic back pain presented to the hospital with chest pain.  1.  Chest pain. The patient's chest pain is quite atypical, but she does have some risk factors for coronary artery disease. Her EKG shows no acute ST or T wave changes. Her first set of cardiac markers are negative. For now observe her on telemetry, follow serial enzymes, continue aspirin and beta blockers. She is actually allergic to all statins. We will get a Myoview in the morning. Her last Myoview was done in the office last year and also she had a negative Myoview in 2013. We will also get a cardiology consult. Discuss the case with Dr. Lady Gary, if he believes that the patient would benefit from a cardiac catheterization instead of a Myoview. He will discuss those options with the patient.  2.  Hypertension, presently hemodynamically stable. Continue atenolol and HCTZ.  3.  History of congestive heart failure. Clinically, the patient does not appear in congestive heart failure. Has no shortness of breath. Continue with the beta blocker and HCTZ for now.   4.  Gastroesophageal reflux disease. Continue Protonix.   CODE STATUS: The patient is a full code.   TIME SPENT WITH THE ADMISSION: 50 minutes.    ____________________________ Rolly Pancake. Cherlynn Kaiser, MD vjs:at D: 02/04/2015 16:09:32 ET T: 02/04/2015 17:50:26 ET JOB#: [id number]  cc: Rolly Pancake. Cherlynn Kaiser, MD, <Dictator> Houston Siren MD ELECTRONICALLY SIGNED 02/19/2015 16:16

## 2015-03-15 NOTE — Consult Note (Signed)
   Present Illness Pt is an [age] African American female with history of episodes of chest pain over that past few years . She  has had negative funcitonal studies approximately every year. She had a functional study in 2014 which was negative. She states she currently has midsternal chest pain wich radiation to her arms. This occurs at rest and with exertion. She also has a systollic murmur and a history of aortic valve disease. She has ruled out for an mi thus far and ekg did not reveal significant ischemia. She has history of hypertension and ckd iii.   Physical Exam:  GEN no acute distress, obese   HEENT PERRL   NECK supple   RESP normal resp effort   CARD Regular rate and rhythm  Murmur   Murmur Systolic   Systolic Murmur Out flow   ABD denies tenderness   LYMPH negative neck, negative axillae   EXTR negative cyanosis/clubbing, negative edema   SKIN normal to palpation   NEURO cranial nerves intact, motor/sensory function intact   PSYCH A+O to time, place, person   Review of Systems:  Subjective/Chief Complaint chest pain and sob   General: Fatigue   Skin: No Complaints   ENT: No Complaints   Eyes: No Complaints   Neck: No Complaints   Respiratory: Short of breath   Cardiovascular: Chest pain or discomfort  Tightness   Gastrointestinal: No Complaints   Genitourinary: No Complaints   Vascular: No Complaints   Musculoskeletal: No Complaints   Neurologic: No Complaints   Hematologic: No Complaints   Endocrine: No Complaints   Psychiatric: No Complaints   Review of Systems: All other systems were reviewed and found to be negative   Medications/Allergies Reviewed Medications/Allergies reviewed   EKG:  EKG NSR   Abnormal NSSTTW changes    Lisinopril: Swelling  Statins: Pain  Ultram: GI Distress  Prilosec: Unknown  Crestor: Unknown  Avapro: Unknown  Plavix: Unknown  Pravachol: Unknown   Impression Pt with history of hypertension who was  admitted with midsternal chest pain with radiation to her arm. She had ruled out for an mi thus far. Pain has concerning features for angina. She also has a systollic murmur consistant with aortic valve disease. Had several negative funcitonal studies over the past few years. Pain has worsened and ocurs at rest. Will proceed with an echo to determine if there is any signifcant valvular dissease and then proceed iwht left cardiac cath to evaluate anatomy to guide further rherapy   Plan 1. Continue to rule out for mi on current meds 2. Echo to evaluate lv function and valves 3. Consider left cardiac cath to evaliuate anatomy to guide further therapy in a patient with rest chest pain 4. Futher recs after cath 5. Continue with asa, and nitreats   Electronic Signatures: Dalia HeadingFath, Devaughn Savant A (MD)  (Signed 23-Mar-16 19:28)  Authored: General Aspect/Present Illness, History and Physical Exam, Review of System, EKG , Allergies, Impression/Plan   Last Updated: 23-Mar-16 19:28 by Dalia HeadingFath, Chavie Kolinski A (MD)

## 2015-04-16 DIAGNOSIS — K5732 Diverticulitis of large intestine without perforation or abscess without bleeding: Secondary | ICD-10-CM | POA: Insufficient documentation

## 2015-05-21 ENCOUNTER — Encounter: Payer: Self-pay | Admitting: *Deleted

## 2015-05-22 ENCOUNTER — Encounter
Admission: RE | Disposition: A | Discharge status: Home / Self Care | Payer: Self-pay | Source: Ambulatory Visit | Attending: Unknown Physician Specialty

## 2015-05-22 ENCOUNTER — Ambulatory Visit: Payer: Medicare HMO | Admitting: Anesthesiology

## 2015-05-22 ENCOUNTER — Ambulatory Visit
Admission: RE | Admit: 2015-05-22 | Discharge: 2015-05-22 | Disposition: A | Discharge status: Home / Self Care | Payer: Medicare HMO | Source: Ambulatory Visit | Attending: Unknown Physician Specialty | Admitting: Unknown Physician Specialty

## 2015-05-22 DIAGNOSIS — Z96653 Presence of artificial knee joint, bilateral: Secondary | ICD-10-CM | POA: Insufficient documentation

## 2015-05-22 DIAGNOSIS — K449 Diaphragmatic hernia without obstruction or gangrene: Secondary | ICD-10-CM | POA: Insufficient documentation

## 2015-05-22 DIAGNOSIS — K222 Esophageal obstruction: Secondary | ICD-10-CM | POA: Diagnosis not present

## 2015-05-22 DIAGNOSIS — M1990 Unspecified osteoarthritis, unspecified site: Secondary | ICD-10-CM | POA: Insufficient documentation

## 2015-05-22 DIAGNOSIS — K219 Gastro-esophageal reflux disease without esophagitis: Secondary | ICD-10-CM | POA: Insufficient documentation

## 2015-05-22 DIAGNOSIS — I10 Essential (primary) hypertension: Secondary | ICD-10-CM | POA: Diagnosis not present

## 2015-05-22 DIAGNOSIS — Z7982 Long term (current) use of aspirin: Secondary | ICD-10-CM | POA: Diagnosis not present

## 2015-05-22 DIAGNOSIS — M545 Low back pain: Secondary | ICD-10-CM | POA: Insufficient documentation

## 2015-05-22 DIAGNOSIS — D649 Anemia, unspecified: Secondary | ICD-10-CM | POA: Diagnosis not present

## 2015-05-22 DIAGNOSIS — R1310 Dysphagia, unspecified: Secondary | ICD-10-CM | POA: Diagnosis present

## 2015-05-22 DIAGNOSIS — I4891 Unspecified atrial fibrillation: Secondary | ICD-10-CM | POA: Insufficient documentation

## 2015-05-22 DIAGNOSIS — Z8673 Personal history of transient ischemic attack (TIA), and cerebral infarction without residual deficits: Secondary | ICD-10-CM | POA: Insufficient documentation

## 2015-05-22 DIAGNOSIS — J309 Allergic rhinitis, unspecified: Secondary | ICD-10-CM | POA: Diagnosis not present

## 2015-05-22 DIAGNOSIS — E785 Hyperlipidemia, unspecified: Secondary | ICD-10-CM | POA: Diagnosis not present

## 2015-05-22 DIAGNOSIS — Z888 Allergy status to other drugs, medicaments and biological substances status: Secondary | ICD-10-CM | POA: Insufficient documentation

## 2015-05-22 DIAGNOSIS — Z87442 Personal history of urinary calculi: Secondary | ICD-10-CM | POA: Insufficient documentation

## 2015-05-22 DIAGNOSIS — R011 Cardiac murmur, unspecified: Secondary | ICD-10-CM | POA: Insufficient documentation

## 2015-05-22 DIAGNOSIS — Z90710 Acquired absence of both cervix and uterus: Secondary | ICD-10-CM | POA: Diagnosis not present

## 2015-05-22 DIAGNOSIS — G4730 Sleep apnea, unspecified: Secondary | ICD-10-CM | POA: Insufficient documentation

## 2015-05-22 DIAGNOSIS — Z79899 Other long term (current) drug therapy: Secondary | ICD-10-CM | POA: Diagnosis not present

## 2015-05-22 HISTORY — PX: SAVORY DILATION: SHX5439

## 2015-05-22 HISTORY — PX: ESOPHAGOGASTRODUODENOSCOPY: SHX5428

## 2015-05-22 SURGERY — EGD (ESOPHAGOGASTRODUODENOSCOPY)
Anesthesia: General

## 2015-05-22 MED ORDER — PROPOFOL INFUSION 10 MG/ML OPTIME
INTRAVENOUS | Status: DC | PRN
  Administered 2015-05-22: 140 ug/kg/min via INTRAVENOUS

## 2015-05-22 MED ORDER — SODIUM CHLORIDE 0.9 % IV SOLN
INTRAVENOUS | Status: DC
  Administered 2015-05-22: 1000 mL via INTRAVENOUS

## 2015-05-22 MED ORDER — SODIUM CHLORIDE 0.9 % IV SOLN
INTRAVENOUS | Status: DC
  Administered 2015-05-22: 14:00:00 via INTRAVENOUS

## 2015-05-22 MED ORDER — PROPOFOL 10 MG/ML IV BOLUS
INTRAVENOUS | Status: DC | PRN
  Administered 2015-05-22: 50 mg via INTRAVENOUS

## 2015-05-22 MED ORDER — LIDOCAINE HCL (PF) 2 % IJ SOLN
INTRAMUSCULAR | Status: DC | PRN
  Administered 2015-05-22: 50 mg

## 2015-05-22 MED ORDER — PIPERACILLIN-TAZOBACTAM 3.375 G IVPB 30 MIN
3.3750 g | Freq: Once | INTRAVENOUS | Status: AC
  Administered 2015-05-22: 3.375 g via INTRAVENOUS
  Filled 2015-05-22: qty 50

## 2015-05-22 NOTE — Anesthesia Postprocedure Evaluation (Signed)
  Anesthesia Post-op Note  Patient: Rose [name]  Procedure(s) Performed: Procedure(s): ESOPHAGOGASTRODUODENOSCOPY (EGD) (N/A) SAVORY DILATION (N/A)  Anesthesia type:General  Patient location: PACU  Post pain: Pain level controlled  Post assessment: Post-op Vital signs reviewed, Patient's Cardiovascular Status Stable, Respiratory Function Stable, Patent Airway and No signs of Nausea or vomiting  Post vital signs: Reviewed and stable  Last Vitals:  Filed Vitals:   05/22/15 1429  BP: 149/73  Pulse: 62  Temp:   Resp: 15    Level of consciousness: awake, alert  and patient cooperative  Complications: No apparent anesthesia complications

## 2015-05-22 NOTE — H&P (Signed)
Primary Care Physician:  Rose Sato, MD Primary Gastroenterologist:  Dr. Mechele Collin  Pre-Procedure History & Physical: HPI:  Rose [name] is a 81 y.o. female is here for an endoscopy.   Past Medical History  Diagnosis Date  . Nephrolithiasis   . Hypertension   . GERD (gastroesophageal reflux disease)   . Allergic rhinitis   . Osteoarthritis   . Lumbar spine pain   . Carpal tunnel syndrome   . Sleep apnea   . TIA (transient ischemic attack)   . Heart murmur   . Arrhythmia     s/p ablation for A-fib  . Hyperlipidemia   . Anemia     Past Surgical History  Procedure Laterality Date  . Replacement total knee bilateral    . Abdominal hysterectomy    . Tonsillectomy    . Cardiac catheterization    . Renal artery stent    . Ablation  January 2011    A-fib    Prior to Admission medications   Medication Sig Start Date End Date Taking? Authorizing Provider  aspirin EC 81 MG tablet Take 81 mg by mouth daily.   Yes Historical Provider, MD  atenolol (TENORMIN) 50 MG tablet Take 50 mg by mouth 2 (two) times daily.     Yes Historical Provider, MD  omeprazole (PRILOSEC) 40 MG capsule Take 40 mg by mouth 2 (two) times daily.     Yes Historical Provider, MD  prazosin (MINIPRESS) 1 MG capsule Take 1 mg by mouth at bedtime.   Yes Historical Provider, MD  hydrALAZINE (APRESOLINE) 50 MG tablet Take 1 tablet (50 mg total) by mouth 3 (three) times daily. 08/05/11 08/04/12  Antonieta Iba, MD  hydrochlorothiazide (HYDRODIURIL) 25 MG tablet Take 1 tablet (25 mg total) by mouth daily. 08/05/11 08/04/12  Antonieta Iba, MD  losartan (COZAAR) 100 MG tablet Take 1 tablet (100 mg total) by mouth daily. Patient not taking: Reported on 05/22/2015 08/05/11 08/04/12  Antonieta Iba, MD    Allergies as of 04/29/2015 - Review Complete 08/05/2011  Allergen Reaction Noted  . Avapro [irbesartan]  08/05/2011  . Lisinopril  08/05/2011  . Plavix [clopidogrel bisulfate]  08/05/2011  . Pravachol   08/05/2011  . Ultram [tramadol hcl]  08/05/2011    History reviewed. No pertinent family history.  History   Social History  . Marital Status: Widowed    Spouse Name: N/A  . Number of Children: N/A  . Years of Education: N/A   Occupational History  . Not on file.   Social History Main Topics  . Smoking status: Never Smoker   . Smokeless tobacco: Not on file  . Alcohol Use: Not on file  . Drug Use: Not on file  . Sexual Activity: Not on file   Other Topics Concern  . Not on file   Social History Narrative    Review of Systems: See HPI, otherwise negative ROS  Physical Exam: BP 163/72 mmHg  Pulse 58  Temp(Src) 96.5 F (35.8 C) (Tympanic)  Resp 16  Ht  (1.626 m)  Wt 93.895 kg (207 lb)  BMI 35.51 kg/m2  SpO2 100% General:   Alert,  pleasant and cooperative in NAD Head:  Normocephalic and atraumatic. Neck:  Supple; no masses or thyromegaly. Lungs:  Clear throughout to auscultation.    Heart:  Regular rate and rhythm. Abdomen:  Soft, nontender and nondistended. Normal bowel sounds, without guarding, and without rebound.   Neurologic:  Alert and  oriented x4;  grossly normal neurologically.  Impression/Plan: Rose [name] is here for an endoscopy to be performed for dysphagia  Risks, benefits, limitations, and alternatives regarding  endoscopy have been reviewed with the patient.  Questions have been answered.  All parties agreeable.   Lynnae Prude, MD  05/22/2015, 2:00 PM   Primary Care Physician:  Rose Sato, MD Primary Gastroenterologist:  Dr. Mechele Collin  Pre-Procedure History & Physical: HPI:  Rose [name] is a 81 y.o. female is here for an endoscopy.   Past Medical History  Diagnosis Date  . Nephrolithiasis   . Hypertension   . GERD (gastroesophageal reflux disease)   . Allergic rhinitis   . Osteoarthritis   . Lumbar spine pain   . Carpal tunnel syndrome   . Sleep apnea   . TIA (transient ischemic attack)   . Heart murmur   .  Arrhythmia     s/p ablation for A-fib  . Hyperlipidemia   . Anemia     Past Surgical History  Procedure Laterality Date  . Replacement total knee bilateral    . Abdominal hysterectomy    . Tonsillectomy    . Cardiac catheterization    . Renal artery stent    . Ablation  January 2011    A-fib    Prior to Admission medications   Medication Sig Start Date End Date Taking? Authorizing Provider  aspirin EC 81 MG tablet Take 81 mg by mouth daily.   Yes Historical Provider, MD  atenolol (TENORMIN) 50 MG tablet Take 50 mg by mouth 2 (two) times daily.     Yes Historical Provider, MD  omeprazole (PRILOSEC) 40 MG capsule Take 40 mg by mouth 2 (two) times daily.     Yes Historical Provider, MD  prazosin (MINIPRESS) 1 MG capsule Take 1 mg by mouth at bedtime.   Yes Historical Provider, MD  hydrALAZINE (APRESOLINE) 50 MG tablet Take 1 tablet (50 mg total) by mouth 3 (three) times daily. 08/05/11 08/04/12  Antonieta Iba, MD  hydrochlorothiazide (HYDRODIURIL) 25 MG tablet Take 1 tablet (25 mg total) by mouth daily. 08/05/11 08/04/12  Antonieta Iba, MD  losartan (COZAAR) 100 MG tablet Take 1 tablet (100 mg total) by mouth daily. Patient not taking: Reported on 05/22/2015 08/05/11 08/04/12  Antonieta Iba, MD    Allergies as of 04/29/2015 - Review Complete 08/05/2011  Allergen Reaction Noted  . Avapro [irbesartan]  08/05/2011  . Lisinopril  08/05/2011  . Plavix [clopidogrel bisulfate]  08/05/2011  . Pravachol  08/05/2011  . Ultram [tramadol hcl]  08/05/2011    History reviewed. No pertinent family history.  History   Social History  . Marital Status: Widowed    Spouse Name: N/A  . Number of Children: N/A  . Years of Education: N/A   Occupational History  . Not on file.   Social History Main Topics  . Smoking status: Never Smoker   . Smokeless tobacco: Not on file  . Alcohol Use: Not on file  . Drug Use: Not on file  . Sexual Activity: Not on file   Other Topics Concern  .  Not on file   Social History Narrative    Review of Systems: See HPI, otherwise negative ROS  Physical Exam: BP 163/72 mmHg  Pulse 58  Temp(Src) 96.5 F (35.8 C) (Tympanic)  Resp 16  Ht 5\' 4"  (1.626 m)  Wt 93.895 kg (207 lb)  BMI 35.51 kg/m2  SpO2 100% General:   Alert,  pleasant and cooperative  in NAD Head:  Normocephalic and atraumatic. Neck:  Supple; no masses or thyromegaly. Lungs:  Clear throughout to auscultation.    Heart:  Regular rate and rhythm. Abdomen:  Soft, nontender and nondistended. Normal bowel sounds, without guarding, and without rebound.   Neurologic:  Alert and  oriented x4;  grossly normal neurologically.  Impression/Plan: Rose [name] is here for an endoscopy to be performed for dysphagia.  Risks, benefits, limitations, and alternatives regarding  endoscopy have been reviewed with the patient.  Questions have been answered.  All parties agreeable.   Lynnae PrudeELLIOTT, ROBERT, MD  05/22/2015, 2:00 PM

## 2015-05-22 NOTE — Op Note (Signed)
[facility] Patient Name: Rose [name] Procedure Date: 05/22/2015 12:55 PM MRN: [id number] Account #: [id number] Date of Birth: July 11, 1930 Admit Type: Outpatient Age: 80 Room: [facility] ENDO ROOM 1 Gender: Female Note Status: Finalized Procedure:         Upper GI endoscopy Indications:       Dysphagia Providers:         Scot Jun, MD Referring MD:      Dustin Folks, MD (Referring MD) Medicines:         Propofol per Anesthesia Complications:     No immediate complications. Procedure:         Pre-Anesthesia Assessment:                    - After reviewing the risks and benefits, the patient was                     deemed in satisfactory condition to undergo the procedure.                    After obtaining informed consent, the endoscope was passed                     under direct vision. Throughout the procedure, the                     patient's blood pressure, pulse, and oxygen saturations                     were monitored continuously. The Olympus GIF-160 endoscope                     (S#. [id number]) was introduced through the mouth, and                     advanced to the second part of duodenum. The upper GI                     endoscopy was accomplished without difficulty. The patient                     tolerated the procedure well. Findings:      A moderate Schatzki ring (acquired) was found at the gastroesophageal       junction. A guidewire was placed and the scope was withdrawn. Dilation       was performed with a Savary dilator with mild resistance at 16 mm and 17       mm.      A medium-sized hiatus hernia was present.      The examined duodenum was normal. Impression:        - Moderate Schatzki ring. Dilated.                    - Medium-sized hiatus hernia.                    - Normal examined duodenum.                    - No specimens collected. Recommendation:    - soft food for 3 days, eat slowly, chew well, take small                 bites Scot Jun, MD 05/22/2015 2:17:37 PM This report has been signed electronically. Number of Addenda:  0 Note Initiated On: 05/22/2015 12:55 PM      [facility]

## 2015-05-22 NOTE — Anesthesia Preprocedure Evaluation (Addendum)
Anesthesia Evaluation  Patient identified by MRN, date of birth, ID band Patient awake    Reviewed: Allergy & Precautions, NPO status , Patient's Chart, lab work & pertinent test results  Airway Mallampati: II  TM Distance: >3 FB Neck ROM: Limited    Dental  (+) Upper Dentures, Lower Dentures   Pulmonary shortness of breath and with exertion, sleep apnea ,  breath sounds clear to auscultation        Cardiovascular Exercise Tolerance: Poor hypertension, Pt. on medications and Pt. on home beta blockers + CAD, + Peripheral Vascular Disease and + DOE Rhythm:Regular  Had an ablation earlier.    Neuro/Psych    GI/Hepatic GERD-  Medicated and Controlled,  Endo/Other    Renal/GU Renal diseaseRenal artery stent.     Musculoskeletal   Abdominal (+) + obese,   Peds  Hematology  (+) anemia ,   Anesthesia Other Findings   Reproductive/Obstetrics                            Anesthesia Physical Anesthesia Plan  ASA: III  Anesthesia Plan: General   Post-op Pain Management:    Induction: Intravenous  Airway Management Planned: Nasal Cannula  Additional Equipment:   Intra-op Plan:   Post-operative Plan:   Informed Consent: I have reviewed the patients History and Physical, chart, labs and discussed the procedure including the risks, benefits and alternatives for the proposed anesthesia with the patient or authorized representative who has indicated his/her understanding and acceptance.     Plan Discussed with: CRNA  Anesthesia Plan Comments:         Anesthesia Quick Evaluation

## 2015-05-22 NOTE — Transfer of Care (Signed)
Immediate Anesthesia Transfer of Care Note  Patient: Rose ResMaggie G [name]  Procedure(s) Performed: Procedure(s): ESOPHAGOGASTRODUODENOSCOPY (EGD) (N/A) SAVORY DILATION (N/A)  Patient Location: PACU  Anesthesia Type:General  Level of Consciousness: sedated  Airway & Oxygen Therapy: Patient Spontanous Breathing and Patient connected to nasal cannula oxygen  Post-op Assessment: Report given to RN and Post -op Vital signs reviewed and stable  Post vital signs: Reviewed and stable  Last Vitals:  Filed Vitals:   05/22/15 1257  BP: 163/72  Pulse: 58  Temp: 35.8 C  Resp: 16    Complications: No apparent anesthesia complications

## 2015-05-25 ENCOUNTER — Encounter: Payer: Self-pay | Admitting: Unknown Physician Specialty

## 2015-06-17 LAB — COMPREHENSIVE METABOLIC PANEL
ALK PHOS: 50 U/L
Albumin: 3.7 g/dL
Anion Gap: 6 — ABNORMAL LOW (ref 7–16)
BUN: 25 mg/dL — AB
Bilirubin,Total: 0.3 mg/dL
Calcium, Total: 9.2 mg/dL
Chloride: 99 mmol/L — ABNORMAL LOW
Co2: 31 mmol/L
Creatinine: 1.04 mg/dL — ABNORMAL HIGH
EGFR (Non-African Amer.): 50 — ABNORMAL LOW
GFR CALC AF AMER: 58 — AB
Glucose: 104 mg/dL — ABNORMAL HIGH
POTASSIUM: 4.1 mmol/L
SGOT(AST): 19 U/L
SGPT (ALT): 15 U/L
SODIUM: 136 mmol/L
Total Protein: 6.8 g/dL

## 2015-06-17 LAB — CBC
HCT: 33.9 % — AB (ref 35.0–47.0)
HGB: 11.1 g/dL — AB (ref 12.0–16.0)
MCH: 27.5 pg (ref 26.0–34.0)
MCHC: 32.8 g/dL (ref 32.0–36.0)
MCV: 84 fL (ref 80–100)
Platelet: 187 10*3/uL (ref 150–440)
RBC: 4.04 10*6/uL (ref 3.80–5.20)
RDW: 15.3 % — ABNORMAL HIGH (ref 11.5–14.5)
WBC: 8.4 10*3/uL (ref 3.6–11.0)

## 2015-06-17 LAB — TROPONIN I: Troponin-I: <0.03 ng/mL

## 2015-10-13 ENCOUNTER — Encounter: Payer: Self-pay | Admitting: *Deleted

## 2015-10-13 ENCOUNTER — Emergency Department: Payer: Medicare HMO

## 2015-10-13 ENCOUNTER — Inpatient Hospital Stay
Admission: EM | Admit: 2015-10-13 | Discharge: 2015-10-15 | DRG: 291 | Disposition: A | Discharge status: Home / Self Care | Payer: Medicare HMO | Attending: Internal Medicine | Admitting: Internal Medicine

## 2015-10-13 DIAGNOSIS — I080 Rheumatic disorders of both mitral and aortic valves: Secondary | ICD-10-CM | POA: Diagnosis present

## 2015-10-13 DIAGNOSIS — Z87442 Personal history of urinary calculi: Secondary | ICD-10-CM

## 2015-10-13 DIAGNOSIS — G4730 Sleep apnea, unspecified: Secondary | ICD-10-CM | POA: Diagnosis present

## 2015-10-13 DIAGNOSIS — Z7982 Long term (current) use of aspirin: Secondary | ICD-10-CM

## 2015-10-13 DIAGNOSIS — Z90710 Acquired absence of both cervix and uterus: Secondary | ICD-10-CM

## 2015-10-13 DIAGNOSIS — Z9889 Other specified postprocedural states: Secondary | ICD-10-CM

## 2015-10-13 DIAGNOSIS — I130 Hypertensive heart and chronic kidney disease with heart failure and stage 1 through stage 4 chronic kidney disease, or unspecified chronic kidney disease: Secondary | ICD-10-CM | POA: Diagnosis not present

## 2015-10-13 DIAGNOSIS — Z96653 Presence of artificial knee joint, bilateral: Secondary | ICD-10-CM | POA: Diagnosis present

## 2015-10-13 DIAGNOSIS — I5033 Acute on chronic diastolic (congestive) heart failure: Secondary | ICD-10-CM | POA: Diagnosis present

## 2015-10-13 DIAGNOSIS — I509 Heart failure, unspecified: Secondary | ICD-10-CM

## 2015-10-13 DIAGNOSIS — R0609 Other forms of dyspnea: Secondary | ICD-10-CM

## 2015-10-13 DIAGNOSIS — Z8673 Personal history of transient ischemic attack (TIA), and cerebral infarction without residual deficits: Secondary | ICD-10-CM

## 2015-10-13 DIAGNOSIS — E1122 Type 2 diabetes mellitus with diabetic chronic kidney disease: Secondary | ICD-10-CM | POA: Diagnosis present

## 2015-10-13 DIAGNOSIS — N183 Chronic kidney disease, stage 3 (moderate): Secondary | ICD-10-CM | POA: Diagnosis present

## 2015-10-13 DIAGNOSIS — M1990 Unspecified osteoarthritis, unspecified site: Secondary | ICD-10-CM | POA: Diagnosis present

## 2015-10-13 DIAGNOSIS — K219 Gastro-esophageal reflux disease without esophagitis: Secondary | ICD-10-CM | POA: Diagnosis present

## 2015-10-13 DIAGNOSIS — R079 Chest pain, unspecified: Secondary | ICD-10-CM

## 2015-10-13 DIAGNOSIS — Z79899 Other long term (current) drug therapy: Secondary | ICD-10-CM

## 2015-10-13 DIAGNOSIS — D649 Anemia, unspecified: Secondary | ICD-10-CM | POA: Diagnosis present

## 2015-10-13 DIAGNOSIS — R0601 Orthopnea: Secondary | ICD-10-CM

## 2015-10-13 LAB — BRAIN NATRIURETIC PEPTIDE: B NATRIURETIC PEPTIDE 5: 509 pg/mL — AB (ref 0.0–100.0)

## 2015-10-13 LAB — CBC
HEMATOCRIT: 30.5 % — AB (ref 35.0–47.0)
Hemoglobin: 9.9 g/dL — ABNORMAL LOW (ref 12.0–16.0)
MCH: 27 pg (ref 26.0–34.0)
MCHC: 32.3 g/dL (ref 32.0–36.0)
MCV: 83.7 fL (ref 80.0–100.0)
Platelets: 222 10*3/uL (ref 150–440)
RBC: 3.65 MIL/uL — ABNORMAL LOW (ref 3.80–5.20)
RDW: 15.5 % — ABNORMAL HIGH (ref 11.5–14.5)
WBC: 8.7 10*3/uL (ref 3.6–11.0)

## 2015-10-13 LAB — BASIC METABOLIC PANEL
ANION GAP: 5 (ref 5–15)
BUN: 21 mg/dL — ABNORMAL HIGH (ref 6–20)
CO2: 29 mmol/L (ref 22–32)
Calcium: 8.9 mg/dL (ref 8.9–10.3)
Chloride: 103 mmol/L (ref 101–111)
Creatinine, Ser: 1.12 mg/dL — ABNORMAL HIGH (ref 0.44–1.00)
GFR calc Af Amer: 51 mL/min — ABNORMAL LOW (ref >60)
GFR, EST NON AFRICAN AMERICAN: 44 mL/min — AB (ref >60)
GLUCOSE: 115 mg/dL — AB (ref 65–99)
POTASSIUM: 4.5 mmol/L (ref 3.5–5.1)
Sodium: 137 mmol/L (ref 135–145)

## 2015-10-13 LAB — TROPONIN I: Troponin I: <0.03 ng/mL (ref <0.031)

## 2015-10-13 LAB — GLUCOSE, CAPILLARY: Glucose-Capillary: 111 mg/dL — ABNORMAL HIGH (ref 65–99)

## 2015-10-13 MED ORDER — SODIUM CHLORIDE 0.9 % IJ SOLN
3.0000 mL | INTRAMUSCULAR | Status: DC | PRN
  Administered 2015-10-14: 3 mL via INTRAVENOUS
  Filled 2015-10-13: qty 10

## 2015-10-13 MED ORDER — ACETAMINOPHEN 325 MG PO TABS
650.0000 mg | ORAL_TABLET | Freq: Four times a day (QID) | ORAL | Status: DC | PRN
  Administered 2015-10-14: 650 mg via ORAL
  Filled 2015-10-13: qty 2

## 2015-10-13 MED ORDER — ENOXAPARIN SODIUM 40 MG/0.4ML ~~LOC~~ SOLN
40.0000 mg | SUBCUTANEOUS | Status: DC
  Administered 2015-10-13 – 2015-10-14 (×2): 40 mg via SUBCUTANEOUS
  Filled 2015-10-13 (×2): qty 0.4

## 2015-10-13 MED ORDER — PRAZOSIN HCL 1 MG PO CAPS
1.0000 mg | ORAL_CAPSULE | Freq: Every day | ORAL | Status: DC
  Administered 2015-10-13 – 2015-10-14 (×2): 1 mg via ORAL
  Filled 2015-10-13 (×3): qty 1

## 2015-10-13 MED ORDER — INSULIN ASPART 100 UNIT/ML ~~LOC~~ SOLN: 0.0000 [IU] | Freq: Every day | SUBCUTANEOUS | Status: DC

## 2015-10-13 MED ORDER — ATENOLOL 25 MG PO TABS
50.0000 mg | ORAL_TABLET | Freq: Two times a day (BID) | ORAL | Status: DC
  Administered 2015-10-13 – 2015-10-14 (×2): 50 mg via ORAL
  Filled 2015-10-13 (×2): qty 2

## 2015-10-13 MED ORDER — DIFLUPREDNATE 0.05 % OP EMUL
1.0000 [drp] | Freq: Three times a day (TID) | OPHTHALMIC | Status: DC
  Administered 2015-10-13 – 2015-10-15 (×5): 1 [drp] via OPHTHALMIC

## 2015-10-13 MED ORDER — SODIUM CHLORIDE 0.9 % IJ SOLN
3.0000 mL | Freq: Two times a day (BID) | INTRAMUSCULAR | Status: DC
  Administered 2015-10-14 – 2015-10-15 (×2): 3 mL via INTRAVENOUS

## 2015-10-13 MED ORDER — DOCUSATE SODIUM 100 MG PO CAPS
100.0000 mg | ORAL_CAPSULE | Freq: Two times a day (BID) | ORAL | Status: DC
  Administered 2015-10-13 – 2015-10-15 (×3): 100 mg via ORAL
  Filled 2015-10-13 (×4): qty 1

## 2015-10-13 MED ORDER — ADULT MULTIVITAMIN W/MINERALS CH
1.0000 | ORAL_TABLET | Freq: Every day | ORAL | Status: DC
  Administered 2015-10-14 – 2015-10-15 (×2): 1 via ORAL
  Filled 2015-10-13 (×4): qty 1

## 2015-10-13 MED ORDER — NEPAFENAC 0.3 % OP SUSP: 1.0000 [drp] | Freq: Every day | OPHTHALMIC | Status: DC

## 2015-10-13 MED ORDER — SODIUM CHLORIDE 0.9 % IJ SOLN
3.0000 mL | Freq: Two times a day (BID) | INTRAMUSCULAR | Status: DC
  Administered 2015-10-13 – 2015-10-14 (×3): 3 mL via INTRAVENOUS

## 2015-10-13 MED ORDER — GATIFLOXACIN 0.5 % OP SOLN
1.0000 [drp] | Freq: Four times a day (QID) | OPHTHALMIC | Status: DC
  Filled 2015-10-13: qty 2.5

## 2015-10-13 MED ORDER — BESIFLOXACIN HCL 0.6 % OP SUSP
1.0000 [drp] | Freq: Three times a day (TID) | OPHTHALMIC | Status: DC
  Administered 2015-10-13 – 2015-10-15 (×5): 1 [drp] via OPHTHALMIC

## 2015-10-13 MED ORDER — FUROSEMIDE 10 MG/ML IJ SOLN
20.0000 mg | Freq: Two times a day (BID) | INTRAMUSCULAR | Status: DC
  Administered 2015-10-14: 20 mg via INTRAVENOUS
  Filled 2015-10-13: qty 2

## 2015-10-13 MED ORDER — FUROSEMIDE 10 MG/ML IJ SOLN
40.0000 mg | Freq: Once | INTRAMUSCULAR | Status: AC
  Administered 2015-10-13: 40 mg via INTRAVENOUS
  Filled 2015-10-13: qty 4

## 2015-10-13 MED ORDER — RANOLAZINE ER 500 MG PO TB12
500.0000 mg | ORAL_TABLET | Freq: Two times a day (BID) | ORAL | Status: DC
  Administered 2015-10-13 – 2015-10-15 (×4): 500 mg via ORAL
  Filled 2015-10-13 (×4): qty 1

## 2015-10-13 MED ORDER — NEPAFENAC 0.1 % OP SUSP
1.0000 [drp] | Freq: Three times a day (TID) | OPHTHALMIC | Status: DC
  Filled 2015-10-13: qty 3

## 2015-10-13 MED ORDER — ALBUTEROL SULFATE (2.5 MG/3ML) 0.083% IN NEBU: 2.5000 mg | INHALATION_SOLUTION | Freq: Four times a day (QID) | RESPIRATORY_TRACT | Status: DC | PRN

## 2015-10-13 MED ORDER — SODIUM CHLORIDE 0.9 % IV SOLN: 250.0000 mL | INTRAVENOUS | Status: DC | PRN

## 2015-10-13 MED ORDER — ONDANSETRON HCL 4 MG/2ML IJ SOLN: 4.0000 mg | Freq: Four times a day (QID) | INTRAMUSCULAR | Status: DC | PRN

## 2015-10-13 MED ORDER — ONDANSETRON HCL 4 MG PO TABS: 4.0000 mg | ORAL_TABLET | Freq: Four times a day (QID) | ORAL | Status: DC | PRN

## 2015-10-13 MED ORDER — NEPAFENAC 0.3 % OP SUSP
1.0000 [drp] | Freq: Every day | OPHTHALMIC | Status: DC
  Administered 2015-10-13 – 2015-10-14 (×2): 1 [drp] via OPHTHALMIC

## 2015-10-13 MED ORDER — PREDNISOLONE ACETATE 1 % OP SUSP
1.0000 [drp] | Freq: Three times a day (TID) | OPHTHALMIC | Status: DC
  Filled 2015-10-13: qty 1

## 2015-10-13 MED ORDER — DIFLUPREDNATE 0.05 % OP EMUL: 1.0000 [drp] | Freq: Three times a day (TID) | OPHTHALMIC | Status: DC

## 2015-10-13 MED ORDER — ALBUTEROL SULFATE HFA 108 (90 BASE) MCG/ACT IN AERS: 2.0000 | INHALATION_SPRAY | Freq: Four times a day (QID) | RESPIRATORY_TRACT | Status: DC | PRN

## 2015-10-13 MED ORDER — GARLIC 1000 MG PO CAPS: 1000.0000 mg | ORAL_CAPSULE | Freq: Every day | ORAL | Status: DC

## 2015-10-13 MED ORDER — POTASSIUM 99 MG PO TABS: 99.0000 mg | ORAL_TABLET | Freq: Every day | ORAL | Status: DC

## 2015-10-13 MED ORDER — ASPIRIN EC 81 MG PO TBEC
81.0000 mg | DELAYED_RELEASE_TABLET | Freq: Every day | ORAL | Status: DC
  Administered 2015-10-14 – 2015-10-15 (×2): 81 mg via ORAL
  Filled 2015-10-13 (×2): qty 1

## 2015-10-13 MED ORDER — INSULIN ASPART 100 UNIT/ML ~~LOC~~ SOLN: 0.0000 [IU] | Freq: Three times a day (TID) | SUBCUTANEOUS | Status: DC

## 2015-10-13 MED ORDER — HYDROCHLOROTHIAZIDE 25 MG PO TABS
25.0000 mg | ORAL_TABLET | Freq: Every day | ORAL | Status: DC
  Administered 2015-10-14: 25 mg via ORAL
  Filled 2015-10-13: qty 1

## 2015-10-13 MED ORDER — PANTOPRAZOLE SODIUM 40 MG PO TBEC
40.0000 mg | DELAYED_RELEASE_TABLET | Freq: Every day | ORAL | Status: DC
  Administered 2015-10-14 – 2015-10-15 (×2): 40 mg via ORAL
  Filled 2015-10-13 (×2): qty 1

## 2015-10-13 MED ORDER — METOPROLOL TARTRATE 1 MG/ML IV SOLN
5.0000 mg | INTRAVENOUS | Status: DC | PRN
Start: 2015-10-13 — End: 2015-10-15

## 2015-10-13 MED ORDER — ACETAMINOPHEN 650 MG RE SUPP: 650.0000 mg | Freq: Four times a day (QID) | RECTAL | Status: DC | PRN

## 2015-10-13 NOTE — ED Notes (Signed)
Patient transported to X-ray 

## 2015-10-13 NOTE — Progress Notes (Addendum)
Patient alert and oriented x4. Oriented to room, unit, and call bell. No complaints at this time. Telemetry box verified. BP elevated, Dr. Amado CoeGouru notified, waiting for orders. [facility] patient status. Trudee KusterBrandi R Mansfield

## 2015-10-13 NOTE — H&P (Signed)
[facility] - [place] at [facility]   PATIENT NAME: Rose [name]    MR#:  [id number]  DATE OF BIRTH:  05-15-1930  DATE OF ADMISSION:  10/13/2015  PRIMARY CARE PHYSICIAN: Leanna Sato, MD   REQUESTING/REFERRING PHYSICIAN: Dr. Sharma Covert  CHIEF COMPLAINT:  Shortness of breath  HISTORY OF PRESENT ILLNESS:  Rose [name]  is a 81 y.o. female with a known history of essential hypertension, TIA, congestive heart failure on hydrochlorothiazide but not on Lasix is presenting to the ED with a chief complaint of 4 pounds weight gain in the past 3-4 days and exertional shortness of breath. Patient was unable to lay flat for the past few days. Reporting intermittent episodes of central chest pressure. Initial troponin is negative and 80. Patient was given Lasix 40 mg IV by the ED physician  PAST MEDICAL HISTORY:   Past Medical History  Diagnosis Date  . Nephrolithiasis   . Hypertension   . GERD (gastroesophageal reflux disease)   . Allergic rhinitis   . Osteoarthritis   . Lumbar spine pain   . Carpal tunnel syndrome   . Sleep apnea   . TIA (transient ischemic attack)   . Heart murmur   . Arrhythmia     s/p ablation for A-fib  . Hyperlipidemia   . Anemia     PAST SURGICAL HISTOIRY:   Past Surgical History  Procedure Laterality Date  . Replacement total knee bilateral    . Abdominal hysterectomy    . Tonsillectomy    . Cardiac catheterization    . Renal artery stent    . Ablation  January 2011    A-fib  . Esophagogastroduodenoscopy N/A 05/22/2015    Procedure: ESOPHAGOGASTRODUODENOSCOPY (EGD);  Surgeon: Scot Jun, MD;  Location: [facility] ENDOSCOPY;  Service: Endoscopy;  Laterality: N/A;  . Savory dilation N/A 05/22/2015    Procedure: SAVORY DILATION;  Surgeon: Scot Jun, MD;  Location: [facility] ENDOSCOPY;  Service: Endoscopy;  Laterality: N/A;    SOCIAL HISTORY:   Social History  Substance Use Topics  . Smoking status: Never Smoker   . Smokeless  tobacco: Not on file  . Alcohol Use: Not on file    FAMILY HISTORY:  No family history on file.  DRUG ALLERGIES:   Allergies  Allergen Reactions  . Avapro [Irbesartan]   . Lisinopril   . Plavix [Clopidogrel Bisulfate]   . Pravachol   . Ultram [Tramadol Hcl]     REVIEW OF SYSTEMS:  CONSTITUTIONAL: No fever, fatigue or weakness. Reporting 4 pounds Weight gain in 3 days EYES: No blurred or double vision.  EARS, NOSE, AND THROAT: No tinnitus or ear pain.  RESPIRATORY: Reporting shortness of breath, denies wheezing or hemoptysis.  CARDIOVASCULAR: No chest pain, orthopnea, edema.  GASTROINTESTINAL: No nausea, vomiting, diarrhea or abdominal pain.  GENITOURINARY: No dysuria, hematuria.  ENDOCRINE: No polyuria, nocturia,  HEMATOLOGY: No anemia, easy bruising or bleeding SKIN: No rash or lesion. MUSCULOSKELETAL: No joint pain or arthritis.   NEUROLOGIC: No tingling, numbness, weakness.  PSYCHIATRY: No anxiety or depression.   MEDICATIONS AT HOME:   Prior to Admission medications   Medication Sig Start Date End Date Taking? Authorizing Provider  albuterol (PROVENTIL HFA;VENTOLIN HFA) 108 (90 BASE) MCG/ACT inhaler Inhale 2 puffs into the lungs every 6 (six) hours as needed for wheezing or shortness of breath.   Yes Historical Provider, MD  amoxicillin-clavulanate (AUGMENTIN) 875-125 MG tablet Take 1 tablet by mouth 2 (two) times daily. For 7 days 10/07/15  Yes Historical Provider, MD  aspirin EC 81 MG tablet Take 81 mg by mouth daily.   Yes Historical Provider, MD  atenolol (TENORMIN) 50 MG tablet Take 50 mg by mouth 2 (two) times daily.     Yes Historical Provider, MD  Besifloxacin HCl 0.6 % SUSP Place 1 drop into the left eye 3 (three) times daily. 09/22/15  Yes Historical Provider, MD  Difluprednate 0.05 % EMUL Place 1 drop into the left eye 3 (three) times daily. 09/22/15  Yes Historical Provider, MD  Garlic 1000 MG CAPS Take 1,000 mg by mouth daily.   Yes Historical Provider, MD   hydrochlorothiazide (HYDRODIURIL) 25 MG tablet Take 25 mg by mouth daily.   Yes Historical Provider, MD  Multiple Vitamin (MULTIVITAMIN) tablet Take 1 tablet by mouth daily.   Yes Historical Provider, MD  nepafenac (ILEVRO) 0.3 % ophthalmic suspension Place 1 drop into the left eye at bedtime.   Yes Historical Provider, MD  omeprazole (PRILOSEC) 20 MG capsule Take 20 mg by mouth daily.   Yes Historical Provider, MD  Potassium 99 MG TABS Take 99 mg by mouth daily.   Yes Historical Provider, MD  prazosin (MINIPRESS) 1 MG capsule Take 1 mg by mouth at bedtime.   Yes Historical Provider, MD  ranolazine (RANEXA) 500 MG 12 hr tablet Take 500 mg by mouth 2 (two) times daily.   Yes Historical Provider, MD      VITAL SIGNS:  Blood pressure 185/84, pulse 71, temperature 98.1 F (36.7 C), temperature source Oral, resp. rate 22, height  (1.626 m), weight 96.163 kg (212 lb), SpO2 94 %.  PHYSICAL EXAMINATION:  GENERAL:  81 y.o.-year-old patient lying in the bed with no acute distress.  EYES: Pupils equal, round, reactive to light and accommodation. No scleral icterus. Extraocular muscles intact.  HEENT: Head atraumatic, normocephalic. Oropharynx and nasopharynx clear.  NECK:  Supple, no jugular venous distention. No thyroid enlargement, no tenderness.  LUNGS: Normal breath sounds bilaterally, no wheezing. Positive minimum rales ,rhonchi at lower lung fields  No use of accessory muscles of respiration.  CARDIOVASCULAR: S1, S2 normal. No murmurs, rubs, or gallops.  ABDOMEN: Soft, nontender, nondistended. Bowel sounds present. No organomegaly or mass.  EXTREMITIES: 1+ pitting pedal edema, cyanosis, or clubbing.  NEUROLOGIC: Cranial nerves II through XII are intact. Muscle strength 5/5 in all extremities. Sensation intact. Gait not checked.  PSYCHIATRIC: The patient is alert and oriented x 3.  SKIN: No obvious rash, lesion, or ulcer.   LABORATORY PANEL:   CBC  Recent Labs Lab 10/13/15 1552   WBC 8.7  HGB 9.9*  HCT 30.5*  PLT 222   ------------------------------------------------------------------------------------------------------------------  Chemistries   Recent Labs Lab 10/13/15 1552  NA 137  K 4.5  CL 103  CO2 29  GLUCOSE 115*  BUN 21*  CREATININE 1.12*  CALCIUM 8.9   ------------------------------------------------------------------------------------------------------------------  Cardiac Enzymes  Recent Labs Lab 10/13/15 1552  TROPONINI <0.03   ------------------------------------------------------------------------------------------------------------------  RADIOLOGY:  Dg Chest 2 View  10/13/2015  CLINICAL DATA:  Short of breath EXAM: CHEST  2 VIEW COMPARISON:  02/04/2015 FINDINGS: Cardiac enlargement. Pulmonary vascular congestion with mild interstitial edema and small floor pleural effusions. Mild bibasilar atelectasis. IMPRESSION: Congestive heart failure with interstitial edema and small pleural effusions. Electronically Signed   By: Marlan Palau M.D.   On: 10/13/2015 16:30    EKG:   Orders placed or performed during the hospital encounter of 10/13/15  . ED EKG within 10 minutes  . EKG  12-Lead  . EKG 12-Lead  . ED EKG within 10 minutes    IMPRESSION AND PLAN:   Patient is presenting with a chief complaint of 4 pounds weight gain in the past 3-4 days and exertional shortness of breath. Patient was unable to lay flat for the past few days. Reporting intermittent episodes of central chest pressure. Initial troponin is negative and 80. Patient was given Lasix 40 mg IV by the ED physician   Assessment and plan  1 acute respiratory distress secondary to CHF exacerbation Admit to telemetry We will start her on Lasix IV Monitor intake and output and daily weights Continue her home medication aspirin, beta blocker Check  fasting lipid panel and statin is added to the regimen.  Consult is placed to cardiology, Dr. Juliann Paresallwood We will get an  echocardiogram  2. Elevated blood pressure with a history of essential hypertension On Lasix 20 mg IV every 12 hours Continue her home medication hydrochlorothiazide and atenolol.  3. Diet-controlled diabetes mellitus Will continue diabetic diet and check hemoglobin A1c. Patient will be on sliding scale insulin  4. History of GERD Continue PPI      All the records are reviewed and case discussed with ED provider. Management plans discussed with the patient, family and they are in agreement.  CODE STATUS: FC , daughters  TOTAL TIME TAKING CARE OF THIS PATIENT: 45 minutes.    Ramonita LabGouru, Darya Bigler M.D on 10/13/2015 at 5:51 PM  Between 7am to 6pm - Pager - [phone]  After 6pm go to [email or web address] - password [email or web address] [facility]  [facility]  Office  [phone]  CC: Primary care physician; Leanna SatoMILES,LINDA M, MD

## 2015-10-13 NOTE — Progress Notes (Signed)
PHARMACIST - PHYSICIAN ORDER COMMUNICATION  CONCERNING: P&T Medication Policy on Herbal Medications  DESCRIPTION:  This patient's order for:  garlic  has been noted.  This product(s) is classified as an "herbal" or natural product. Due to a lack of definitive safety studies or FDA approval, nonstandard manufacturing practices, plus the potential risk of unknown drug-drug interactions while on inpatient medications, the Pharmacy and Therapeutics Committee does not permit the use of "herbal" or natural products of this type within [place].   ACTION TAKEN: The pharmacy department is unable to verify this order at this time and your patient has been informed of this safety policy. Please reevaluate patient's clinical condition at discharge and address if the herbal or natural product(s) should be resumed at that time.   

## 2015-10-13 NOTE — ED Notes (Signed)
Few days ago developed sob, has some chest pain, sweats, increased pedal edema

## 2015-10-13 NOTE — ED Provider Notes (Signed)
[facility] Emergency Department Provider Note  ____________________________________________  Time seen: Approximately 5:08 PM  I have reviewed the triage vital signs and the nursing notes.   HISTORY  Chief Complaint Shortness of Breath    HPI Tresa ResMaggie G Lenker is a 81 y.o. female with a history of CHF presenting with lower extremity swelling, weight gain, exertional shortness of breath, dyspnea on exertion, and chest pain. Patient states that over the past week or so she has noticed progressively worsening DOE which is occasionally accompanied by a central pressure in her chest. The chest pain is nonradiating, is not associated with nausea or vomiting, diaphoresis. It resolves with rest. She has noticed a 4 pound weight gain.   Past Medical History  Diagnosis Date  . Nephrolithiasis   . Hypertension   . GERD (gastroesophageal reflux disease)   . Allergic rhinitis   . Osteoarthritis   . Lumbar spine pain   . Carpal tunnel syndrome   . Sleep apnea   . TIA (transient ischemic attack)   . Heart murmur   . Arrhythmia     s/p ablation for A-fib  . Hyperlipidemia   . Anemia     Patient Active Problem List   Diagnosis Date Noted  . SOB (shortness of breath) 08/05/2011  . HTN (hypertension) 08/05/2011  . Hyperlipidemia 08/05/2011  . PVD (peripheral vascular disease) (HCC) 08/05/2011  . CAD (coronary artery disease) 08/05/2011    Past Surgical History  Procedure Laterality Date  . Replacement total knee bilateral    . Abdominal hysterectomy    . Tonsillectomy    . Cardiac catheterization    . Renal artery stent    . Ablation  January 2011    A-fib  . Esophagogastroduodenoscopy N/A 05/22/2015    Procedure: ESOPHAGOGASTRODUODENOSCOPY (EGD);  Surgeon: Scot Junobert T Elliott, MD;  [facility];  Service: Endoscopy;  Laterality: N/A;  . Savory dilation N/A 05/22/2015    Procedure: SAVORY DILATION;  Surgeon: Scot Junobert T Elliott, MD;  [facility]  ENDOS[facility];  Service: Endoscopy;  Laterality: N/A;    Current Outpatient Rx  Name  Route  Sig  Dispense  Refill  . albuterol (PROVENTIL HFA;VENTOLIN HFA) 108 (90 BASE) MCG/ACT inhaler   Inhalation   Inhale 2 puffs into the lungs every 6 (six) hours as needed for wheezing or shortness of breath.         Marland Kitchen. amoxicillin-clavulanate (AUGMENTIN) 875-125 MG tablet   Oral   Take 1 tablet by mouth 2 (two) times daily. For 7 days         . aspirin EC 81 MG tablet   Oral   Take 81 mg by mouth daily.         Marland Kitchen. atenolol (TENORMIN) 50 MG tablet   Oral   Take 50 mg by mouth 2 (two) times daily.           Marland Kitchen. Besifloxacin HCl 0.6 % SUSP   Left Eye   Place 1 drop into the left eye 3 (three) times daily.         . Difluprednate 0.05 % EMUL   Left Eye   Place 1 drop into the left eye 3 (three) times daily.         . Garlic 1000 MG CAPS   Oral   Take 1,000 mg by mouth daily.         . hydrochlorothiazide (HYDRODIURIL) 25 MG tablet   Oral   Take 25 mg by mouth daily.         .Marland Kitchen  Multiple Vitamin (MULTIVITAMIN) tablet   Oral   Take 1 tablet by mouth daily.         . nepafenac (ILEVRO) 0.3 % ophthalmic suspension   Left Eye   Place 1 drop into the left eye at bedtime.         Marland Kitchen omeprazole (PRILOSEC) 20 MG capsule   Oral   Take 20 mg by mouth daily.         . Potassium 99 MG TABS   Oral   Take 99 mg by mouth daily.         . prazosin (MINIPRESS) 1 MG capsule   Oral   Take 1 mg by mouth at bedtime.         . ranolazine (RANEXA) 500 MG 12 hr tablet   Oral   Take 500 mg by mouth 2 (two) times daily.           Allergies Avapro; Lisinopril; Plavix; Pravachol; and Ultram  No family history on file.  Social History Social History  Substance Use Topics  . Smoking status: Never Smoker   . Smokeless tobacco: None  . Alcohol Use: None    Review of Systems Constitutional: No fever/chills. Positive lightheadedness with exertion. No syncope. Eyes: No  visual changes. ENT: No sore throat. Cardiovascular: Positive chest pain, negative palpitations. Respiratory: Positive shortness of breath.  Positive dyspnea on exertion and orthopnea. No cough. Gastrointestinal: No abdominal pain.  No nausea, no vomiting.  No diarrhea.  No constipation. Genitourinary: Negative for dysuria. Musculoskeletal: Negative for back pain. Positive lower extremity swelling. Skin: Negative for rash. Neurological: Negative for headaches, focal weakness or numbness.  10-point ROS otherwise negative.  ____________________________________________   PHYSICAL EXAM:  VITAL SIGNS: ED Triage Vitals  Enc Vitals Group     BP 10/13/15 1549 175/65 mmHg     Pulse Rate 10/13/15 1549 67     Resp 10/13/15 1549 24     Temp 10/13/15 1549 98.1 F (36.7 C)     Temp Source 10/13/15 1549 Oral     SpO2 10/13/15 1549 94 %     Weight 10/13/15 1549 212 lb (96.163 kg)     Height 10/13/15 1549  (1.626 m)     Head Cir --      Peak Flow --      Pain Score 10/13/15 1551 5     Pain Loc --      Pain Edu? --      Excl. in GC? --     Constitutional: Alert and oriented. Well appearing and in no acute distress. Answer question appropriately. Eyes: Conjunctivae are normal.  EOMI. Head: Atraumatic. Nose: No congestion/rhinnorhea. Mouth/Throat: Mucous membranes are moist.  Neck: No stridor.  Supple.  JVD. Cardiovascular: Normal rate, regular rhythm. No murmurs, rubs or gallops.  Respiratory: Patient is mildly tachypnea without accessory use or retractions. She is able to speak in full sentences. She has minimal Rales at the bases bilaterally and some end expiratory wheezing. She has good air exchange and normal oxygen saturation on room air. Gastrointestinal: Soft and nontender. No distention. No peritoneal signs. Musculoskeletal: Bilateral symmetric lower extremity edema without palpable cords, calf tenderness, or Homans sign.  Neurologic:  Normal speech and language. No gross  focal neurologic deficits are appreciated.  Skin:  Skin is warm, dry and intact. No rash noted. Psychiatric: Mood and affect are normal. Speech and behavior are normal.  Normal judgement.  ____________________________________________   LABS (all labs ordered are listed,  but only abnormal results are displayed)  Labs Reviewed  BASIC METABOLIC PANEL - Abnormal; Notable for the following:    Glucose, Bld 115 (*)    BUN 21 (*)    Creatinine, Ser 1.12 (*)    GFR calc non Af Amer 44 (*)    GFR calc Af Amer 51 (*)    All other components within normal limits  CBC - Abnormal; Notable for the following:    RBC 3.65 (*)    Hemoglobin 9.9 (*)    HCT 30.5 (*)    RDW 15.5 (*)    All other components within normal limits  BRAIN NATRIURETIC PEPTIDE - Abnormal; Notable for the following:    B Natriuretic Peptide 509.0 (*)    All other components within normal limits  TROPONIN I   ____________________________________________  EKG  ED ECG REPORT I, Rockne Menghini, the attending physician, personally viewed and interpreted this ECG.   Date: 10/13/2015  EKG Time: 1554  Rate: 66  Rhythm: normal sinus rhythm  Axis: Normal  Intervals:first-degree A-V block   ST&T Change: Nonspecific T-wave inversions in V1 and V2 no ST elevation.  ____________________________________________  RADIOLOGY  Dg Chest 2 View  10/13/2015  CLINICAL DATA:  Short of breath EXAM: CHEST  2 VIEW COMPARISON:  02/04/2015 FINDINGS: Cardiac enlargement. Pulmonary vascular congestion with mild interstitial edema and small floor pleural effusions. Mild bibasilar atelectasis. IMPRESSION: Congestive heart failure with interstitial edema and small pleural effusions. Electronically Signed   By: Marlan Palau M.D.   On: 10/13/2015 16:30    ____________________________________________   PROCEDURES  Procedure(s) performed: None  Critical Care performed: No ____________________________________________   INITIAL  IMPRESSION / ASSESSMENT AND PLAN / ED COURSE  Pertinent labs & imaging results that were available during my care of the patient were reviewed by me and considered in my medical decision making (see chart for details).  81 y.o. female with a history of CHF presenting with signs and symptoms of an acute CHF exacerbation. She is intermittently having chest pain which is likely due to strain, but she will need to be evaluated for ACS or MI. Her troponin here is negative and her EKG does not show ischemic changes which is reassuring. She does have some mild renal insufficiency and may have some intravascular hypovolemia, but I will give her Lasix to treat her fluid overload and attempt to decrease her symptoms and decreased the strain on her heart. She is admitted to the hospitalist in stable condition.  ____________________________________________  FINAL CLINICAL IMPRESSION(S) / ED DIAGNOSES  Final diagnoses:  Acute on chronic congestive heart failure, unspecified congestive heart failure type (HCC)  DOE (dyspnea on exertion)  Orthopnea  Chest pain, unspecified chest pain type      NEW MEDICATIONS STARTED DURING THIS VISIT:  New Prescriptions   No medications on file     Rockne Menghini, MD 10/13/15 1719

## 2015-10-14 ENCOUNTER — Inpatient Hospital Stay
Admit: 2015-10-14 | Discharge: 2015-10-14 | Disposition: A | Discharge status: Home / Self Care | Payer: Medicare HMO | Attending: Internal Medicine | Admitting: Internal Medicine

## 2015-10-14 LAB — CBC
HCT: 28.9 % — ABNORMAL LOW (ref 35.0–47.0)
HEMOGLOBIN: 9.4 g/dL — AB (ref 12.0–16.0)
MCH: 27.1 pg (ref 26.0–34.0)
MCHC: 32.6 g/dL (ref 32.0–36.0)
MCV: 83.2 fL (ref 80.0–100.0)
Platelets: 204 10*3/uL (ref 150–440)
RBC: 3.47 MIL/uL — AB (ref 3.80–5.20)
RDW: 15 % — ABNORMAL HIGH (ref 11.5–14.5)
WBC: 7.7 10*3/uL (ref 3.6–11.0)

## 2015-10-14 LAB — GLUCOSE, CAPILLARY
GLUCOSE-CAPILLARY: 100 mg/dL — AB (ref 65–99)
GLUCOSE-CAPILLARY: 111 mg/dL — AB (ref 65–99)
Glucose-Capillary: 102 mg/dL — ABNORMAL HIGH (ref 65–99)
Glucose-Capillary: 99 mg/dL (ref 65–99)

## 2015-10-14 LAB — BASIC METABOLIC PANEL
ANION GAP: 4 — AB (ref 5–15)
BUN: 22 mg/dL — AB (ref 6–20)
CHLORIDE: 102 mmol/L (ref 101–111)
CO2: 31 mmol/L (ref 22–32)
Calcium: 8.6 mg/dL — ABNORMAL LOW (ref 8.9–10.3)
Creatinine, Ser: 1.18 mg/dL — ABNORMAL HIGH (ref 0.44–1.00)
GFR calc Af Amer: 48 mL/min — ABNORMAL LOW (ref >60)
GFR calc non Af Amer: 41 mL/min — ABNORMAL LOW (ref >60)
GLUCOSE: 103 mg/dL — AB (ref 65–99)
POTASSIUM: 4.1 mmol/L (ref 3.5–5.1)
SODIUM: 137 mmol/L (ref 135–145)

## 2015-10-14 LAB — TSH: TSH: 1.688 u[IU]/mL (ref 0.350–4.500)

## 2015-10-14 LAB — HEMOGLOBIN A1C: Hgb A1c MFr Bld: 6.1 % — ABNORMAL HIGH (ref 4.0–6.0)

## 2015-10-14 LAB — TROPONIN I
TROPONIN I: 0.03 ng/mL (ref <0.031)
Troponin I: <0.03 ng/mL (ref <0.031)

## 2015-10-14 MED ORDER — FUROSEMIDE 40 MG PO TABS
40.0000 mg | ORAL_TABLET | Freq: Every day | ORAL | Status: DC
  Administered 2015-10-15: 40 mg via ORAL
  Filled 2015-10-14: qty 1

## 2015-10-14 MED ORDER — HYDRALAZINE HCL 10 MG PO TABS
10.0000 mg | ORAL_TABLET | Freq: Three times a day (TID) | ORAL | Status: DC
  Administered 2015-10-14 – 2015-10-15 (×3): 10 mg via ORAL
  Filled 2015-10-14 (×3): qty 1

## 2015-10-14 MED ORDER — METOPROLOL TARTRATE 50 MG PO TABS
50.0000 mg | ORAL_TABLET | Freq: Two times a day (BID) | ORAL | Status: DC
  Administered 2015-10-14 – 2015-10-15 (×2): 50 mg via ORAL
  Filled 2015-10-14 (×2): qty 1

## 2015-10-14 NOTE — Care Management (Signed)
Home health agency preference is [facility]. Referral called for SN

## 2015-10-14 NOTE — Consult Note (Signed)
[facility] Card[facility] Consultation [facility]  Patient ID: Rose ResMaggie G Weissmann, MRN: [id number], DOB/AGE: 79/04/1930 79 y.o. Admit date: 10/13/2015   Date of Consult: 10/14/2015 Primary Physician: Leanna SatoMILES,LINDA M, MD Primary Cardiologist: Vennie Homansalderwood  Chief Complaint:  Chief Complaint  Patient presents with  . Shortness of Breath   Reason for Consult: chest pain shortness of breath  HPI: 81 y.o. female with known valvular heart disease with previous diastolic dysfunction congestive heart failure who has had an acute onset of chest pressure shortness of breath and recurrent diastolic dysfunction congestive heart failure with some pulmonary edema. The patient has not been taking Lasix at this time and has had some slight weight gain as well as fluid retention in addition to possibly change in diet. With this the patient had an exacerbation with shortness of breath and chest pressure. EKG has shown normal sinus rhythm and there is been no evidence of elevated troponin or myocardial infarction. 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 The patient has had some intravenous Lasix and feels much better at this time and is having no further significant symptoms  Past Medical History  Diagnosis Date  . Nephrolithiasis   . Hypertension   . GERD (gastroesophageal reflux disease)   . Allergic rhinitis   . Osteoarthritis   . Lumbar spine pain   . Carpal tunnel syndrome   . Sleep apnea   . TIA (transient ischemic attack)   . Heart murmur   . Arrhythmia     s/p ablation for A-fib  . Hyperlipidemia   . Anemia       Surgical History:  Past Surgical History  Procedure Laterality Date  . Replacement total knee bilateral    . Abdominal hysterectomy    . Tonsillectomy    . Cardiac catheterization    . Renal artery stent    . Ablation  January 2011    A-fib  . Esophagogastroduodenoscopy N/A 05/22/2015   Procedure: ESOPHAGOGASTRODUODENOSCOPY (EGD);  Surgeon: Scot Junobert T Elliott, MD;  [facility];  Service: Endoscopy;  Laterality: N/A;  . Savory dilation N/A 05/22/2015    Procedure: SAVORY DILATION;  Surgeon: Scot Junobert T Elliott, MD;  [facility];  Service: Endoscopy;  Laterality: N/A;     Home Meds: Prior to Admission medications   Medication Sig Start Date End Date Taking? Authorizing Provider  albuterol (PROVENTIL HFA;VENTOLIN HFA) 108 (90 BASE) MCG/ACT inhaler Inhale 2 puffs into the lungs every 6 (six) hours as needed for wheezing or shortness of breath.   Yes Historical Provider, MD  amoxicillin-clavulanate (AUGMENTIN) 875-125 MG tablet Take 1 tablet by mouth 2 (two) times daily. For 7 days 10/07/15  Yes Historical Provider, MD  aspirin EC 81 MG tablet Take 81 mg by mouth daily.   Yes Historical Provider, MD  atenolol (TENORMIN) 50 MG tablet Take 50 mg by mouth 2 (two) times daily.     Yes Historical Provider, MD  Besifloxacin HCl 0.6 % SUSP Place 1 drop into the left eye 3 (three) times daily. 09/22/15  Yes Historical Provider, MD  Difluprednate 0.05 % EMUL Place 1 drop into the left eye 3 (three) times daily. 09/22/15  Yes Historical Provider, MD  Garlic 1000 MG CAPS Take 1,000 mg by mouth daily.   Yes Historical Provider, MD  hydrochlorothiazide (HYDRODIURIL) 25 MG tablet Take 25 mg by mouth daily.   Yes Historical Provider, MD  Multiple Vitamin (MULTIVITAMIN) tablet Take 1  tablet by mouth daily.   Yes Historical Provider, MD  nepafenac (ILEVRO) 0.3 % ophthalmic suspension Place 1 drop into the left eye at bedtime.   Yes Historical Provider, MD  omeprazole (PRILOSEC) 20 MG capsule Take 20 mg by mouth daily.   Yes Historical Provider, MD  Potassium 99 MG TABS Take 99 mg by mouth daily.   Yes Historical Provider, MD  prazosin (MINIPRESS) 1 MG capsule Take 1 mg by mouth at bedtime.   Yes Historical Provider, MD  ranolazine (RANEXA) 500 MG 12 hr tablet Take 500 mg by mouth 2  (two) times daily.   Yes Historical Provider, MD    Inpatient Medications:  . aspirin EC  81 mg Oral Daily  . Besifloxacin HCl  1 drop Left Eye TID  . Difluprednate  1 drop Left Eye TID  . docusate sodium  100 mg Oral BID  . enoxaparin (LOVENOX) injection  40 mg Subcutaneous Q24H  . [START ON 10/15/2015] furosemide  40 mg Oral Daily  . hydrALAZINE  10 mg Oral 3 times per day  . insulin aspart  0-5 Units Subcutaneous QHS  . insulin aspart  0-9 Units Subcutaneous TID WC  . metoprolol tartrate  50 mg Oral BID  . multivitamin with minerals  1 tablet Oral Daily  . nepafenac  1 drop Left Eye QHS  . pantoprazole  40 mg Oral Daily  . prazosin  1 mg Oral QHS  . ranolazine  500 mg Oral BID  . sodium chloride  3 mL Intravenous Q12H  . sodium chloride  3 mL Intravenous Q12H      Allergies:  Allergies  Allergen Reactions  . Avapro [Irbesartan]   . Lisinopril   . Plavix [Clopidogrel Bisulfate]   . Pravachol   . Ultram [Tramadol Hcl]     Social History   Social History  . Marital Status: Widowed    Spouse Name: N/A  . Number of Children: N/A  . Years of Education: N/A   Occupational History  . Not on file.   Social History Main Topics  . Smoking status: Never Smoker   . Smokeless tobacco: Not on file  . Alcohol Use: Not on file  . Drug Use: Not on file  . Sexual Activity: Not on file   Other Topics Concern  . Not on file   Social History Narrative     No family history on file.   Review of Systems Positive for shortness of breath chest pain Negative for: General:  chills, fever, night sweats   Cardiovascular: PND orthopnea syncope dizziness  Dermatological skin lesions rashes Respiratory: Cough congestion Urologic: Frequent urination urination at night and hematuria Abdominal: negative for nausea, vomiting, diarrhea, bright red blood per rectum, melena, or hematemesis Neurologic: negative for visual changes, and/or hearing changes  All other systems reviewed and  are otherwise negative except as noted above.  Labs:  Recent Labs  10/13/15 1552 10/14/15 0412 10/14/15 1249  TROPONINI <0.03 0.03 <0.03   Lab Results  Component Value Date   WBC 7.7 10/14/2015   HGB 9.4* 10/14/2015   HCT 28.9* 10/14/2015   MCV 83.2 10/14/2015   PLT 204 10/14/2015    Recent Labs Lab 10/14/15 0412  NA 137  K 4.1  CL 102  CO2 31  BUN 22*  CREATININE 1.18*  CALCIUM 8.6*  GLUCOSE 103*   No results found for: CHOL, HDL, LDLCALC, TRIG No results found for: DDIMER  Radiology/Studies:  Dg Chest 2 View  10/13/2015  CLINICAL DATA:  Short of breath EXAM: CHEST  2 VIEW COMPARISON:  02/04/2015 FINDINGS: Cardiac enlargement. Pulmonary vascular congestion with mild interstitial edema and small floor pleural effusions. Mild bibasilar atelectasis. IMPRESSION: Congestive heart failure with interstitial edema and small pleural effusions. Electronically Signed   By: Marlan Palau M.D.   On: 10/13/2015 16:30    EKG: Normal sinus rhythm  Weights: Filed Weights   10/13/15 1549 10/13/15 1933 10/14/15 0431  Weight: 212 lb (96.163 kg) 211 lb 6.4 oz (95.89 kg) 210 lb 8 oz (95.482 kg)     Physical Exam: Blood pressure 154/62, pulse 63, temperature 99.7 F (37.6 C), temperature source Oral, resp. rate 20, height  (1.626 m), weight 210 lb 8 oz (95.482 kg), SpO2 94 %. Body mass index is 36.11 kg/(m^2). General: Well developed, well nourished, in no acute distress. Head eyes ears nose throat: Normocephalic, atraumatic, sclera non-icteric, no xanthomas, nares are without discharge. No apparent thyromegaly and/or mass  Lungs: Normal respiratory effort.  no wheezes, few rales, no rhonchi.  Heart: RRR with normal soft S1 soft S2. 3+ right upper sternal border murmur gallop, no rub, PMI is normal size and placement, carotid upstroke normal with  bruit, jugular venous pressure is normal Abdomen: Soft, non-tender, non-distended with normoactive bowel sounds. No hepatomegaly.  No rebound/guarding. No obvious abdominal masses. Abdominal aorta is normal size without bruit Extremities: Trace to 1+ edema. no cyanosis, no clubbing, no ulcers  Peripheral : 2+ bilateral upper extremity pulses, 1 + bilateral femoral pulses, 2+ bilateral dorsal pedal pulse Neuro: Alert and oriented. No facial asymmetry. No focal deficit. Moves all extremities spontaneously. Musculoskeletal: Normal muscle tone without kyphosis Psych:  Responds to questions appropriately with a normal affect.    Assessment: 81 year old female with acute on chronic diastolic dysfunction congestive heart failure causing shortness of breath and chest pressure most consistent with causes of some possible chronic kidney disease fluid retention and valvular heart disease without evidence of myocardial infarction  Plan: 1. Intravenous Lasix and changed to oral Lasix for further risk reduction in recurrent diastolic dysfunction heart failure 2. Continue beta blocker for slower heart rate with mitral valve stenosis and diastolic dysfunction and hypertension control 3. No further cardiac diagnostics necessary at this time 4. Continue medication management and ambulate and follow for any further significant symptoms and possible discharge to home if doing well with follow-up next week  Signed, Lamar Blinks M.D. [facility] Cardiology 10/14/2015, 5:08 PM

## 2015-10-14 NOTE — Progress Notes (Signed)
Patient ID: Rose [name], female   DOB: January 05, 1930, 81 y.o.   MRN: [id number] [facility] PROGRESS NOTE  PCP: Leanna Sato, MD  HPI/Subjective: Patient breathing better than when she came in. She had some palpitations couple weeks ago and some chest pressure since then. Still having a little chest pressure now.  Objective: Filed Vitals:   10/14/15 0431 10/14/15 0935  BP: 161/77 148/74  Pulse: 72 60  Temp: 98.2 F (36.8 C) 98.2 F (36.8 C)  Resp: 18     Intake/Output Summary (Last 24 hours) at 10/14/15 1027 Last data filed at 10/14/15 0840  Gross per 24 hour  Intake    240 ml  Output   3770 ml  Net  -3530 ml   Filed Weights   10/13/15 1549 10/13/15 1933 10/14/15 0431  Weight: 96.163 kg (212 lb) 95.89 kg (211 lb 6.4 oz) 95.482 kg (210 lb 8 oz)    ROS: Review of Systems  Constitutional: Negative for fever and chills.  Eyes: Negative for blurred vision.  Respiratory: Positive for cough and shortness of breath.   Cardiovascular: Positive for chest pain and palpitations.  Gastrointestinal: Negative for nausea, vomiting, abdominal pain, diarrhea and constipation.  Genitourinary: Negative for dysuria.  Musculoskeletal: Negative for joint pain.  Neurological: Negative for dizziness and headaches.   Exam: Physical Exam  Constitutional: She is oriented to person, place, and time.  HENT:  Nose: No mucosal edema.  Mouth/Throat: No oropharyngeal exudate or posterior oropharyngeal edema.  Eyes: Conjunctivae, EOM and lids are normal. Pupils are equal, round, and reactive to light.  Neck: No JVD present. Carotid bruit is not present. No edema present. No thyroid mass and no thyromegaly present.  Cardiovascular: S1 normal and S2 normal.  Exam reveals no gallop.   Murmur heard.  Systolic murmur is present with a grade of 3/6  Pulses:      Dorsalis pedis pulses are 2+ on the right side, and 2+ on the left side.  Respiratory: No respiratory distress. She has no  wheezes. She has no rhonchi. She has no rales.  GI: Soft. Bowel sounds are normal. There is no tenderness.  Musculoskeletal:       Right ankle: She exhibits swelling.       Left ankle: She exhibits swelling.  Lymphadenopathy:    She has no cervical adenopathy.  Neurological: She is alert and oriented to person, place, and time. No cranial nerve deficit.  Skin: Skin is warm. No rash noted. Nails show no clubbing.  Psychiatric: She has a normal mood and affect.    Data Reviewed: Basic Metabolic Panel:  Recent Labs Lab 10/13/15 1552 10/14/15 0412  NA 137 137  K 4.5 4.1  CL 103 102  CO2 29 31  GLUCOSE 115* 103*  BUN 21* 22*  CREATININE 1.12* 1.18*  CALCIUM 8.9 8.6*   CBC:  Recent Labs Lab 10/13/15 1552 10/14/15 0412  WBC 8.7 7.7  HGB 9.9* 9.4*  HCT 30.5* 28.9*  MCV 83.7 83.2  PLT 222 204   Cardiac Enzymes:  Recent Labs Lab 10/13/15 1552  TROPONINI <0.03   BNP (last 3 results)  Recent Labs  10/13/15 1552  BNP 509.0*    Studies: Dg Chest 2 View  10/13/2015  CLINICAL DATA:  Short of breath EXAM: CHEST  2 VIEW COMPARISON:  02/04/2015 FINDINGS: Cardiac enlargement. Pulmonary vascular congestion with mild interstitial edema and small floor pleural effusions. Mild bibasilar atelectasis. IMPRESSION: Congestive heart failure with interstitial edema and small pleural  effusions. Electronically Signed   By: Marlan Palauharles  Clark M.D.   On: 10/13/2015 16:30    Scheduled Meds: . aspirin EC  81 mg Oral Daily  . Besifloxacin HCl  1 drop Left Eye TID  . Difluprednate  1 drop Left Eye TID  . docusate sodium  100 mg Oral BID  . enoxaparin (LOVENOX) injection  40 mg Subcutaneous Q24H  . [START ON 10/15/2015] furosemide  40 mg Oral Daily  . hydrALAZINE  10 mg Oral 3 times per day  . insulin aspart  0-5 Units Subcutaneous QHS  . insulin aspart  0-9 Units Subcutaneous TID WC  . metoprolol tartrate  50 mg Oral BID  . multivitamin with minerals  1 tablet Oral Daily  . nepafenac  1  drop Left Eye QHS  . pantoprazole  40 mg Oral Daily  . prazosin  1 mg Oral QHS  . ranolazine  500 mg Oral BID  . sodium chloride  3 mL Intravenous Q12H  . sodium chloride  3 mL Intravenous Q12H    Assessment/Plan:  1. Acute congestive heart failure- likely diastolic in nature with recent stress test with normal EF. Echocardiogram still pending. Switch IV Lasix over to oral for tomorrow. Switch atenolol over to metoprolol. 2. Chest pain- recent stress test with Dr. Juliann Paresallwood negative. Send off a few more cardiac enzymes and get cardiology evaluation 3. Essential hypertension- blood pressure elevated start hydralazine low-dose 4. Gastroesophageal reflux disease without esophagitis on Protonix 5. Sleep apnea listed as a problem but the patient states that they never got back to her with the test results 6. History of TIA in the past on aspirin 7. Ambulate today  Code Status:     Code Status Orders        Start     Ordered   10/13/15 1856  Full code   Continuous     10/13/15 1855     Family Communication: family at bedside  Disposition Plan: potentially home tomorrow  Consultants:  Cardiology  Time spent: 25 minutes  Alford HighlandWIETING, Sharmon Cheramie  [facility]

## 2015-10-14 NOTE — Progress Notes (Signed)
Intermittent leg cramps this shift.  Declines pain meds.  Mustard consumed and warm foot soaks done with moderate relief.

## 2015-10-14 NOTE — Progress Notes (Signed)
Pt walked x1 lap around nurses station.  Pt O2 saturation remained around 95-96% on RA.  Pt did have to stop x3 to rest (no respiratory distress noted) and stated she had some chest pain 7-8/10 during exertion.  MD Dr. Renae GlossWieting notified, stated pt will be observed for one more night and that cardiology is on board.  Will continue to monitor.

## 2015-10-14 NOTE — Progress Notes (Signed)
*  PRELIMINARY RESULTS* Echocardiogram 2D Echocardiogram has been performed.  Rose [name] 10/14/2015, 8:15 AM

## 2015-10-14 NOTE — Progress Notes (Signed)
PT Cancellation Note  Patient Details Name: Tresa ResMaggie G Maricle MRN: [id number] DOB: 07/15/1930   Cancelled Treatment:    Reason Eval/Treat Not Completed: Medical issues which prohibited therapy (Chart reviewed for attempted treatment session.  Noted reports of patient chest pain/pressure with pending troponin draws and cardiology evaluation.  Will hold mobility until completed and patient cleared for activity.)  Arwyn Besaw H. Manson PasseyBrown, PT, DPT, NCS 10/14/2015, 11:27 AM [phone]

## 2015-10-14 NOTE — Care Management (Signed)
Patient presents from home with progressive shortness of breath.  She has chronic home 02 from [employer] which she "uses when she needs it.  Daughter - Dois DavenportSandra says that she has found that "patient is using her oxygen more frequently."  Patient agrees.  Found that current home 02 order is for "10 hours a day."  Patient does not have 02 tanks for travel.  Discussed the possibility of home health nurse and patient/daughter in agreement.   May benefit from tele health nursing.  Sounds like she has had this in the past but then had to mail the scales back.  No agency preference.  Patient's current scales are manual and difficult to read.  Can not afford another set of scales that would be easier to read.  CM will obtain a set for patient.  Confirmed contact information and address.  Patient is current with Dr Darreld McleanLinda Miles and other care providers at [facility].  She uses [employer] for transportation

## 2015-10-14 NOTE — Progress Notes (Signed)
Admission skin assessment with Vilinda BlanksBrandi Mansfield, RN.

## 2015-10-15 LAB — BASIC METABOLIC PANEL
ANION GAP: 5 (ref 5–15)
BUN: 26 mg/dL — ABNORMAL HIGH (ref 6–20)
CALCIUM: 8.9 mg/dL (ref 8.9–10.3)
CO2: 31 mmol/L (ref 22–32)
Chloride: 101 mmol/L (ref 101–111)
Creatinine, Ser: 1.16 mg/dL — ABNORMAL HIGH (ref 0.44–1.00)
GFR, EST AFRICAN AMERICAN: 49 mL/min — AB (ref >60)
GFR, EST NON AFRICAN AMERICAN: 42 mL/min — AB (ref >60)
Glucose, Bld: 101 mg/dL — ABNORMAL HIGH (ref 65–99)
POTASSIUM: 4.2 mmol/L (ref 3.5–5.1)
SODIUM: 137 mmol/L (ref 135–145)

## 2015-10-15 LAB — CBC
HCT: 30 % — ABNORMAL LOW (ref 35.0–47.0)
Hemoglobin: 9.9 g/dL — ABNORMAL LOW (ref 12.0–16.0)
MCH: 27.5 pg (ref 26.0–34.0)
MCHC: 32.9 g/dL (ref 32.0–36.0)
MCV: 83.4 fL (ref 80.0–100.0)
PLATELETS: 202 10*3/uL (ref 150–440)
RBC: 3.59 MIL/uL — AB (ref 3.80–5.20)
RDW: 15 % — AB (ref 11.5–14.5)
WBC: 7.5 10*3/uL (ref 3.6–11.0)

## 2015-10-15 LAB — GLUCOSE, CAPILLARY: GLUCOSE-CAPILLARY: 105 mg/dL — AB (ref 65–99)

## 2015-10-15 MED ORDER — FUROSEMIDE 40 MG PO TABS: 40.0000 mg | ORAL_TABLET | Freq: Every day | ORAL | Status: DC

## 2015-10-15 MED ORDER — METOPROLOL TARTRATE 50 MG PO TABS: 50.0000 mg | ORAL_TABLET | Freq: Two times a day (BID) | ORAL | Status: DC

## 2015-10-15 MED ORDER — HYDRALAZINE HCL 10 MG PO TABS: 10.0000 mg | ORAL_TABLET | Freq: Two times a day (BID) | ORAL | Status: DC

## 2015-10-15 NOTE — Care Management Important Message (Signed)
Important Message  Patient Details  Name: Tresa ResMaggie G Burgoon MRN: [id number] Date of Birth: 06/20/1930   Medicare Important Message Given:  Yes    Olegario MessierKathy A Elan Brainerd 10/15/2015, 10:14 AM

## 2015-10-15 NOTE — Care Management (Signed)
Patient to be discharged to home today.  Home health order for SN and telehealth has been placed.  [name]  Patient will be provided with scales prior to discharge.  Rose BariMary Manley with [facility] notified of patient's pending discharge. Bedside RN to check exertional sats

## 2015-10-15 NOTE — Clinical Documentation Improvement (Signed)
Internal Medicine  Can the diagnosis of possible CKD/mild renal insufficiency be further specified in progress notes and discharge summary:   CKD Stage I - GFR greater than or equal to 90  CKD Stage II - GFR 60-89  CKD Stage III - GFR 30-59  CKD Stage IV - GFR 15-29  CKD Stage V - GFR < 15  ESRD (End Stage Renal Disease)  Other condition  Unable to clinically determine   Supporting Information: :  81 year old black female  History of Renal artery stent  Admitted with Acute on Chronic Diastolic CHF  ED Provider note: She does have some mild renal insufficiency and may have some intravascular hypovolemia, but I will give her Lasix to treat her fluid overload and attempt to decrease her symptoms and decreased the strain on her heart.  Consult note: Assessment: 81 year old female with acute on chronic diastolic dysfunction congestive heart failure causing shortness of breath and chest pressure most consistent with causes of some possible chronic kidney disease fluid retention and valvular heart disease without evidence of myocardial infarction  Component     Latest Ref Rng 10/13/2015 10/14/2015 10/15/2015  BUN     6 - 20 mg/dL 21 (H) 22 (H) 26 (H)  Creatinine     0.44 - 1.00 mg/dL 1.12 (H) 1.18 (H) 1.16 (H)                                EGFR (African American)     >60 mL/min  51 (L) 48 (L) 49 (L)   Monitoring BMPs Saline locked IV Daily weights Monitoring I&Os  Please exercise your independent, professional judgment when responding. A specific answer is not anticipated or expected.   Thank You, [place] [phone]

## 2015-10-15 NOTE — Discharge Summary (Signed)
[facility] [place] at [facility]   PATIENT NAME: Rose [name]    MR#:  [id number]  DATE OF BIRTH:  February 23, 1930  DATE OF ADMISSION:  10/13/2015 ADMITTING PHYSICIAN: Ramonita Lab, MD  DATE OF DISCHARGE: 10/15/2015 12:14 PM  PRIMARY CARE PHYSICIAN: Leanna Sato, MD    ADMISSION DIAGNOSIS:  Orthopnea [R06.01] DOE (dyspnea on exertion) [R06.09] Chest pain, unspecified chest pain type [R07.9] Acute on chronic congestive heart failure, unspecified congestive heart failure type (HCC) [I50.9]  DISCHARGE DIAGNOSIS:  Active Problems:   Acute exacerbation of CHF (congestive heart failure) (HCC)   SECONDARY DIAGNOSIS:   Past Medical History  Diagnosis Date  . Nephrolithiasis   . Hypertension   . GERD (gastroesophageal reflux disease)   . Allergic rhinitis   . Osteoarthritis   . Lumbar spine pain   . Carpal tunnel syndrome   . Sleep apnea   . TIA (transient ischemic attack)   . Heart murmur   . Arrhythmia     s/p ablation for A-fib  . Hyperlipidemia   . Anemia     HOSPITAL COURSE:   1. Acute diastolic congestive heart failure with mitral and aortic stenosis. Patient was diuresed with IV Lasix and switched over to oral Lasix upon discharge home. Patient's atenolol was switched over to metoprolol. The patient has drug allergies to ACE inhibitor as in [facility], so they cannot be used. Low-dose hydralazine added. Follow-up with CHF clinic and cardiology as outpatient. Lungs are clear upon discharge. 2. Essential hypertension- blood pressure was high upon admission and better upon discharge. 3. Gastroesophageal reflux disease without esophagitis- on omeprazole 4. Chronic chest pain- on Ranexa. Recent stress test negative 5. Chronic kidney disease stage III- monitor with diuresis 6. Chronic anemia hemoglobin 9.9 upon discharge   DISCHARGE CONDITIONS:   Satisfactory  CONSULTS OBTAINED:  Treatment Team:  Ramonita Lab, MD Lamar Blinks, MD  DRUG  ALLERGIES:   Allergies  Allergen Reactions  . Avapro [Irbesartan]   . Lisinopril   . Plavix [Clopidogrel Bisulfate]   . Pravachol   . Ultram [Tramadol Hcl]     DISCHARGE MEDICATIONS:   Discharge Medication List as of 10/15/2015  9:53 AM    START taking these medications   Details  furosemide (LASIX) 40 MG tablet Take 1 tablet (40 mg total) by mouth daily., Starting 10/15/2015, Until Discontinued, Print    hydrALAZINE (APRESOLINE) 10 MG tablet Take 1 tablet (10 mg total) by mouth 2 (two) times daily., Starting 10/15/2015, Until Discontinued, Print      CONTINUE these medications which have CHANGED   Details  metoprolol (LOPRESSOR) 50 MG tablet Take 1 tablet (50 mg total) by mouth 2 (two) times daily., Starting 10/15/2015, Until Discontinued, Print      CONTINUE these medications which have NOT CHANGED   Details  albuterol (PROVENTIL HFA;VENTOLIN HFA) 108 (90 BASE) MCG/ACT inhaler Inhale 2 puffs into the lungs every 6 (six) hours as needed for wheezing or shortness of breath., Until Discontinued, Historical Med    aspirin EC 81 MG tablet Take 81 mg by mouth daily., Until Discontinued, Historical Med    Besifloxacin HCl 0.6 % SUSP Place 1 drop into the left eye 3 (three) times daily., Starting 09/22/2015, Until Discontinued, Historical Med    Difluprednate 0.05 % EMUL Place 1 drop into the left eye 3 (three) times daily., Starting 09/22/2015, Until Discontinued, Historical Med    Garlic 1000 MG CAPS Take 1,000 mg by mouth daily., Until Discontinued, Historical Med  Multiple Vitamin (MULTIVITAMIN) tablet Take 1 tablet by mouth daily., Until Discontinued, Historical Med    nepafenac (ILEVRO) 0.3 % ophthalmic suspension Place 1 drop into the left eye at bedtime., Until Discontinued, Historical Med    omeprazole (PRILOSEC) 20 MG capsule Take 20 mg by mouth daily., Until Discontinued, Historical Med    Potassium 99 MG TABS Take 99 mg by mouth daily., Until Discontinued, Historical  Med    prazosin (MINIPRESS) 1 MG capsule Take 1 mg by mouth at bedtime., Until Discontinued, Historical Med    ranolazine (RANEXA) 500 MG 12 hr tablet Take 500 mg by mouth 2 (two) times daily., Until Discontinued, Historical Med      STOP taking these medications     amoxicillin-clavulanate (AUGMENTIN) 875-125 MG tablet      atenolol (TENORMIN) 50 MG tablet      hydrochlorothiazide (HYDRODIURIL) 25 MG tablet          DISCHARGE INSTRUCTIONS:   Follow-up Dr. Juliann Pares one week, CHF clinic 1 week, PMD 2 weeks  If you experience worsening of your admission symptoms, develop shortness of breath, life threatening emergency, suicidal or homicidal thoughts you must seek medical attention immediately by calling 911 or calling your MD immediately  if symptoms less severe.  You Must read complete instructions/literature along with all the possible adverse reactions/side effects for all the Medicines you take and that have been prescribed to you. Take any new Medicines after you have completely understood and accept all the possible adverse reactions/side effects.   Please note  You were cared for by a hospitalist during your hospital stay. If you have any questions about your discharge medications or the care you received while you were in the hospital after you are discharged, you can call the unit and asked to speak with the hospitalist on call if the hospitalist that took care of you is not available. Once you are discharged, your primary care physician will handle any further medical issues. Please note that NO REFILLS for any discharge medications will be authorized once you are discharged, as it is imperative that you return to your primary care physician (or establish a relationship with a primary care physician if you do not have one) for your aftercare needs so that they can reassess your need for medications and monitor your lab values.    Today   CHIEF COMPLAINT:   Chief Complaint   Patient presents with  . Shortness of Breath    HISTORY OF PRESENT ILLNESS:  Rose [name]  is a 81 y.o. female with a known history of hypertension presents with shortness of breath and found to be in heart failure.   VITAL SIGNS:  Blood pressure 160/76, pulse 56, temperature 98.4 F (36.9 C), temperature source Oral, resp. rate 17, height  (1.626 m), weight 95.346 kg (210 lb 3.2 oz), SpO2 97 %.    PHYSICAL EXAMINATION:  GENERAL:  81 y.o.-year-old patient lying in the bed with no acute distress.  EYES: Pupils equal, round, reactive to light and accommodation. No scleral icterus. Extraocular muscles intact.  HEENT: Head atraumatic, normocephalic. Oropharynx and nasopharynx clear.  NECK:  Supple, no jugular venous distention. No thyroid enlargement, no tenderness.  LUNGS: Normal breath sounds bilaterally, no wheezing, rales,rhonchi or crepitation. No use of accessory muscles of respiration.  CARDIOVASCULAR: S1, S2 normal. 3/6 systolic murmur, rubs, or gallops.  ABDOMEN: Soft, non-tender, non-distended. Bowel sounds present. No organomegaly or mass.  EXTREMITIES: Trace edema, no cyanosis, or clubbing.  NEUROLOGIC:  Cranial nerves II through XII are intact. Muscle strength 5/5 in all extremities. Sensation intact. Gait not checked.  PSYCHIATRIC: The patient is alert and oriented x 3.  SKIN: No obvious rash, lesion, or ulcer.   DATA REVIEW:   CBC  Recent Labs Lab 10/15/15 0442  WBC 7.5  HGB 9.9*  HCT 30.0*  PLT 202    Chemistries   Recent Labs Lab 10/15/15 0442  NA 137  K 4.2  CL 101  CO2 31  GLUCOSE 101*  BUN 26*  CREATININE 1.16*  CALCIUM 8.9    Cardiac Enzymes  Recent Labs Lab 10/14/15 1249  TROPONINI <0.03       RADIOLOGY:  Dg Chest 2 View  10/13/2015  CLINICAL DATA:  Short of breath EXAM: CHEST  2 VIEW COMPARISON:  02/04/2015 FINDINGS: Cardiac enlargement. Pulmonary vascular congestion with mild interstitial edema and small floor pleural  effusions. Mild bibasilar atelectasis. IMPRESSION: Congestive heart failure with interstitial edema and small pleural effusions. Electronically Signed   By: Marlan Palauharles  Clark M.D.   On: 10/13/2015 16:30    Management plans discussed with the patient, family and they are in agreement.  CODE STATUS: Full code  TOTAL TIME TAKING CARE OF THIS PATIENT: 35 minutes.    Alford HighlandWIETING, Danylle Ouk M.D on 10/15/2015 at 3:42 PM  Between 7am to 6pm - Pager - [phone]  After 6pm go to [email or web address] - password [email or web address] [facility]  Office  [phone]  CC: Primary care physician; Leanna SatoMILES,LINDA M, MD

## 2015-10-15 NOTE — Progress Notes (Signed)
Initial appointment made at the Heart Failure Clinic on October 28, 2015 at 9:00am. Thank you.

## 2015-10-15 NOTE — Progress Notes (Signed)
CHF vest reading is 37

## 2015-10-15 NOTE — Progress Notes (Signed)
[facility]  Patient: Rose ResMaggie G Capell / Admit Date: 10/13/2015 / Date of Encounter: 10/15/2015, 8:41 AM   Subjective: Still mildly short of breath but significantly improved. No evidence of rhythm disturbances. No evidence of chest discomfort  Review of Systems: Positive for: Shortness of breath Negative for: Vision change, hearing change, syncope, dizziness, nausea, vomiting,diarrhea, bloody stool, stomach pain, cough, congestion, diaphoresis, urinary frequency, urinary pain,skin lesions, skin rashes Others previously listed  Objective: Telemetry: Normal sinus rhythm Physical Exam: Blood pressure 141/57, pulse 61, temperature 98.3 F (36.8 C), temperature source Oral, resp. rate 22, height 5\' 4"  (1.626 m), weight 210 lb 3.2 oz (95.346 kg), SpO2 97 %. Body mass index is 36.06 kg/(m^2). General: Well developed, well nourished, in no acute distress. Head: Normocephalic, atraumatic, sclera non-icteric, no xanthomas, nares are without discharge. Neck: No apparent masses Lungs: Normal respirations with few wheezes, no rhonchi, no rales , no crackles   Heart: Regular rate and rhythm, normal S1 soft S2, no 3+ aortic and plus mitral murmur, no rub, no gallop, PMI is normal size and placement, carotid upstroke normal without bruit, jugular venous pressure normal Abdomen: Soft, non-tender, non-distended with normoactive bowel sounds. No hepatosplenomegaly. Abdominal aorta is normal size without bruit Extremities: Trace edema, no clubbing, no cyanosis, no ulcers,  Peripheral: 2+ radial, 2+ femoral, 2+ dorsal pedal pulses Neuro: Alert and oriented. Moves all extremities spontaneously. Psych:  Responds to questions appropriately with a normal affect.   Intake/Output Summary (Last 24 hours) at 10/15/15 0841 Last data filed at 10/15/15 0700  Gross per 24 hour  Intake    240 ml  Output   1400 ml  Net  -1160 ml    Inpatient Medications:  . aspirin EC  81 mg  Oral Daily  . Besifloxacin HCl  1 drop Left Eye TID  . Difluprednate  1 drop Left Eye TID  . docusate sodium  100 mg Oral BID  . enoxaparin (LOVENOX) injection  40 mg Subcutaneous Q24H  . furosemide  40 mg Oral Daily  . hydrALAZINE  10 mg Oral 3 times per day  . insulin aspart  0-5 Units Subcutaneous QHS  . insulin aspart  0-9 Units Subcutaneous TID WC  . metoprolol tartrate  50 mg Oral BID  . multivitamin with minerals  1 tablet Oral Daily  . nepafenac  1 drop Left Eye QHS  . pantoprazole  40 mg Oral Daily  . prazosin  1 mg Oral QHS  . ranolazine  500 mg Oral BID  . sodium chloride  3 mL Intravenous Q12H  . sodium chloride  3 mL Intravenous Q12H   Infusions:    Labs:  Recent Labs  10/14/15 0412 10/15/15 0442  NA 137 137  K 4.1 4.2  CL 102 101  CO2 31 31  GLUCOSE 103* 101*  BUN 22* 26*  CREATININE 1.18* 1.16*  CALCIUM 8.6* 8.9   No results for input(s): AST, ALT, ALKPHOS, BILITOT, PROT, ALBUMIN in the last 72 hours.  Recent Labs  10/14/15 0412 10/15/15 0442  WBC 7.7 7.5  HGB 9.4* 9.9*  HCT 28.9* 30.0*  MCV 83.2 83.4  PLT 204 202    Recent Labs  10/13/15 1552 10/14/15 0412 10/14/15 1249  TROPONINI <0.03 0.03 <0.03   Invalid input(s): POCBNP  Recent Labs  10/14/15 0412  HGBA1C 6.1*     Weights: Filed Weights   10/13/15 1933 10/14/15 0431 10/15/15 0603  Weight: 211 lb 6.4 oz (95.89 kg) 210 lb  8 oz (95.482 kg) 210 lb 3.2 oz (95.346 kg)     Radiology/Studies:  Dg Chest 2 View  10/13/2015  CLINICAL DATA:  Short of breath EXAM: CHEST  2 VIEW COMPARISON:  02/04/2015 FINDINGS: Cardiac enlargement. Pulmonary vascular congestion with mild interstitial edema and small floor pleural effusions. Mild bibasilar atelectasis. IMPRESSION: Congestive heart failure with interstitial edema and small pleural effusions. Electronically Signed   By: Marlan Palau M.D.   On: 10/13/2015 16:30     Assessment and Recommendation  81 y.o. female with acute on chronic  diastolic dysfunction congestive heart failure multifactorial in nature including valvular heart disease per tension and dietary indiscretion with improvements with intravenous Lasix. Echocardiogram suggest mild aortic stenosis and moderate mitral stenosis which would be best medically managed 1. No further intervention of from the cardiac standpoint due to no need for surgical treatment of valvular heart disease and heart failure 2. Lasix orally for further risk reduction and lower extremity edema and diastolic dysfunction heart failure 3. Continue beta blocker for heart rate control and improved efficiency from the cardiac standpoint and valvular stenosis 4. Dietary consultation for low sodium diet 5. Patient in follow for any further significant symptoms and possible discharge to home today with follow-up next week with adjustments of medications as needed  Signed, Arnoldo Hooker M.D. FACC

## 2015-10-15 NOTE — Discharge Instructions (Addendum)
Heart Failure Clinic appointment on October 28, 2015 at 9:00am with Rose Kindredina Chuckie Mccathern, FNP. Please call [phone] to reschedule.    Heart Failure Heart failure means your heart has trouble pumping blood. This makes it hard for your body to work well. Heart failure is usually a long-term (chronic) condition. You must take good care of yourself and follow your doctor's treatment plan. HOME CARE  Take your heart medicine as told by your doctor.  Do not stop taking medicine unless your doctor tells you to.  Do not skip any dose of medicine.  Refill your medicines before they run out.  Take other medicines only as told by your doctor or pharmacist.  Stay active if told by your doctor. The elderly and people with severe heart failure should talk with a doctor about physical activity.  Eat heart-healthy foods. Choose foods that are without trans fat and are low in saturated fat, cholesterol, and salt (sodium). This includes fresh or frozen fruits and vegetables, fish, lean meats, fat-free or low-fat dairy foods, whole grains, and high-fiber foods. Lentils and dried peas and beans (legumes) are also good choices.  Limit salt if told by your doctor.  Cook in a healthy way. Roast, grill, broil, bake, poach, steam, or stir-fry foods.  Limit fluids as told by your doctor.  Weigh yourself every morning. Do this after you pee (urinate) and before you eat breakfast. Write down your weight to give to your doctor.  Take your blood pressure and write it down if your doctor tells you to.  Ask your doctor how to check your pulse. Check your pulse as told.  Lose weight if told by your doctor.  Stop smoking or chewing tobacco. Do not use gum or patches that help you quit without your doctor's approval.  Schedule and go to doctor visits as told.  Nonpregnant women should have no more than 1 drink a day. Men should have no more than 2 drinks a day. Talk to your doctor about drinking alcohol.  Stop  illegal drug use.  Stay current with shots (immunizations).  Manage your health conditions as told by your doctor.  Learn to manage your stress.  Rest when you are tired.  If it is really hot outside:  Avoid intense activities.  Use air conditioning or fans, or get in a cooler place.  Avoid caffeine and alcohol.  Wear loose-fitting, lightweight, and light-colored clothing.  If it is really cold outside:  Avoid intense activities.  Layer your clothing.  Wear mittens or gloves, a hat, and a scarf when going outside.  Avoid alcohol.  Learn about heart failure and get support as needed.  Get help to maintain or improve your quality of life and your ability to care for yourself as needed. GET HELP IF:   You gain weight quickly.  You are more short of breath than usual.  You cannot do your normal activities.  You tire easily.  You cough more than normal, especially with activity.  You have any or more puffiness (swelling) in areas such as your hands, feet, ankles, or belly (abdomen).  You cannot sleep because it is hard to breathe.  You feel like your heart is beating fast (palpitations).  You get dizzy or light-headed when you stand up. GET HELP RIGHT AWAY IF:   You have trouble breathing.  There is a change in mental status, such as becoming less alert or not being able to focus.  You have chest pain or discomfort.  You  faint. MAKE SURE YOU:   Understand these instructions.  Will watch your condition.  Will get help right away if you are not doing well or get worse.   This information is not intended to replace advice given to you by your health care provider. Make sure you discuss any questions you have with your health care provider.   Document Released: 08/09/2008 Document Revised: 11/21/2014 Document Reviewed: 12/17/2012 Elsevier Interactive Patient Education [employer].

## 2015-10-15 NOTE — Progress Notes (Signed)
Room air. NSR. Pt reports no pain. FS are stable. IV and tele removed. Prescriptions given to pt. Discharge instructions given to pt. Pt has no further concerns at this time.

## 2015-10-28 ENCOUNTER — Ambulatory Visit: Payer: Medicare HMO | Admitting: Family

## 2016-06-20 ENCOUNTER — Observation Stay
Admit: 2016-06-20 | Discharge: 2016-06-20 | Disposition: A | Discharge status: Home / Self Care | Payer: Medicare HMO | Attending: Internal Medicine | Admitting: Internal Medicine

## 2016-06-20 ENCOUNTER — Encounter: Payer: Self-pay | Admitting: Emergency Medicine

## 2016-06-20 ENCOUNTER — Emergency Department: Payer: Medicare HMO

## 2016-06-20 ENCOUNTER — Observation Stay
Admission: EM | Admit: 2016-06-20 | Discharge: 2016-06-21 | Disposition: A | Discharge status: Home / Self Care | Payer: Medicare HMO | Attending: Internal Medicine | Admitting: Internal Medicine

## 2016-06-20 DIAGNOSIS — R011 Cardiac murmur, unspecified: Secondary | ICD-10-CM | POA: Insufficient documentation

## 2016-06-20 DIAGNOSIS — I700 Atherosclerosis of aorta: Secondary | ICD-10-CM | POA: Insufficient documentation

## 2016-06-20 DIAGNOSIS — J309 Allergic rhinitis, unspecified: Secondary | ICD-10-CM | POA: Insufficient documentation

## 2016-06-20 DIAGNOSIS — I739 Peripheral vascular disease, unspecified: Secondary | ICD-10-CM | POA: Diagnosis not present

## 2016-06-20 DIAGNOSIS — R079 Chest pain, unspecified: Secondary | ICD-10-CM | POA: Diagnosis present

## 2016-06-20 DIAGNOSIS — G8929 Other chronic pain: Secondary | ICD-10-CM | POA: Diagnosis not present

## 2016-06-20 DIAGNOSIS — I499 Cardiac arrhythmia, unspecified: Secondary | ICD-10-CM | POA: Insufficient documentation

## 2016-06-20 DIAGNOSIS — I5033 Acute on chronic diastolic (congestive) heart failure: Secondary | ICD-10-CM | POA: Insufficient documentation

## 2016-06-20 DIAGNOSIS — N189 Chronic kidney disease, unspecified: Secondary | ICD-10-CM | POA: Diagnosis not present

## 2016-06-20 DIAGNOSIS — I130 Hypertensive heart and chronic kidney disease with heart failure and stage 1 through stage 4 chronic kidney disease, or unspecified chronic kidney disease: Secondary | ICD-10-CM | POA: Insufficient documentation

## 2016-06-20 DIAGNOSIS — I4891 Unspecified atrial fibrillation: Secondary | ICD-10-CM | POA: Diagnosis not present

## 2016-06-20 DIAGNOSIS — Z79899 Other long term (current) drug therapy: Secondary | ICD-10-CM | POA: Insufficient documentation

## 2016-06-20 DIAGNOSIS — G4730 Sleep apnea, unspecified: Secondary | ICD-10-CM | POA: Insufficient documentation

## 2016-06-20 DIAGNOSIS — I509 Heart failure, unspecified: Secondary | ICD-10-CM

## 2016-06-20 DIAGNOSIS — I083 Combined rheumatic disorders of mitral, aortic and tricuspid valves: Secondary | ICD-10-CM | POA: Diagnosis not present

## 2016-06-20 DIAGNOSIS — I491 Atrial premature depolarization: Secondary | ICD-10-CM | POA: Diagnosis not present

## 2016-06-20 DIAGNOSIS — Z888 Allergy status to other drugs, medicaments and biological substances status: Secondary | ICD-10-CM | POA: Diagnosis not present

## 2016-06-20 DIAGNOSIS — E785 Hyperlipidemia, unspecified: Secondary | ICD-10-CM | POA: Diagnosis not present

## 2016-06-20 DIAGNOSIS — Z7982 Long term (current) use of aspirin: Secondary | ICD-10-CM | POA: Diagnosis not present

## 2016-06-20 DIAGNOSIS — I429 Cardiomyopathy, unspecified: Secondary | ICD-10-CM | POA: Insufficient documentation

## 2016-06-20 DIAGNOSIS — G5600 Carpal tunnel syndrome, unspecified upper limb: Secondary | ICD-10-CM | POA: Insufficient documentation

## 2016-06-20 DIAGNOSIS — K219 Gastro-esophageal reflux disease without esophagitis: Secondary | ICD-10-CM | POA: Insufficient documentation

## 2016-06-20 DIAGNOSIS — M1990 Unspecified osteoarthritis, unspecified site: Secondary | ICD-10-CM | POA: Insufficient documentation

## 2016-06-20 DIAGNOSIS — Z8673 Personal history of transient ischemic attack (TIA), and cerebral infarction without residual deficits: Secondary | ICD-10-CM | POA: Insufficient documentation

## 2016-06-20 DIAGNOSIS — I25119 Atherosclerotic heart disease of native coronary artery with unspecified angina pectoris: Secondary | ICD-10-CM | POA: Diagnosis not present

## 2016-06-20 LAB — CBC WITH DIFFERENTIAL/PLATELET
BASOS ABS: 0 10*3/uL (ref 0–0.1)
BASOS PCT: 0 %
EOS ABS: 0.1 10*3/uL (ref 0–0.7)
EOS PCT: 1 %
HCT: 33.3 % — ABNORMAL LOW (ref 35.0–47.0)
Hemoglobin: 11.1 g/dL — ABNORMAL LOW (ref 12.0–16.0)
LYMPHS PCT: 12 %
Lymphs Abs: 1.2 10*3/uL (ref 1.0–3.6)
MCH: 28.2 pg (ref 26.0–34.0)
MCHC: 33.3 g/dL (ref 32.0–36.0)
MCV: 84.6 fL (ref 80.0–100.0)
Monocytes Absolute: 0.6 10*3/uL (ref 0.2–0.9)
Monocytes Relative: 6 %
Neutro Abs: 7.7 10*3/uL — ABNORMAL HIGH (ref 1.4–6.5)
Neutrophils Relative %: 81 %
PLATELETS: 216 10*3/uL (ref 150–440)
RBC: 3.94 MIL/uL (ref 3.80–5.20)
RDW: 15 % — AB (ref 11.5–14.5)
WBC: 9.6 10*3/uL (ref 3.6–11.0)

## 2016-06-20 LAB — COMPREHENSIVE METABOLIC PANEL
ALBUMIN: 4 g/dL (ref 3.5–5.0)
ALT: 20 U/L (ref 14–54)
AST: 22 U/L (ref 15–41)
Alkaline Phosphatase: 51 U/L (ref 38–126)
Anion gap: 8 (ref 5–15)
BUN: 20 mg/dL (ref 6–20)
CHLORIDE: 98 mmol/L — AB (ref 101–111)
CO2: 30 mmol/L (ref 22–32)
CREATININE: 1.18 mg/dL — AB (ref 0.44–1.00)
Calcium: 9.2 mg/dL (ref 8.9–10.3)
GFR calc Af Amer: 48 mL/min — ABNORMAL LOW (ref >60)
GFR calc non Af Amer: 41 mL/min — ABNORMAL LOW (ref >60)
Glucose, Bld: 94 mg/dL (ref 65–99)
POTASSIUM: 3.9 mmol/L (ref 3.5–5.1)
SODIUM: 136 mmol/L (ref 135–145)
Total Bilirubin: 1 mg/dL (ref 0.3–1.2)
Total Protein: 7.2 g/dL (ref 6.5–8.1)

## 2016-06-20 LAB — PROTIME-INR
INR: 1
Prothrombin Time: 13.2 seconds (ref 11.4–15.2)

## 2016-06-20 LAB — APTT: APTT: 36 s (ref 24–36)

## 2016-06-20 LAB — TROPONIN I
Troponin I: <0.03 ng/mL (ref <0.03)
Troponin I: 0.04 ng/mL (ref <0.03)
Troponin I: <0.03 ng/mL (ref <0.03)

## 2016-06-20 LAB — BRAIN NATRIURETIC PEPTIDE: B NATRIURETIC PEPTIDE 5: 331 pg/mL — AB (ref 0.0–100.0)

## 2016-06-20 MED ORDER — FUROSEMIDE 10 MG/ML IJ SOLN
20.0000 mg | Freq: Two times a day (BID) | INTRAMUSCULAR | Status: DC
  Administered 2016-06-20 – 2016-06-21 (×2): 20 mg via INTRAVENOUS
  Filled 2016-06-20 (×2): qty 2

## 2016-06-20 MED ORDER — HYDRALAZINE HCL 10 MG PO TABS
10.0000 mg | ORAL_TABLET | Freq: Two times a day (BID) | ORAL | Status: DC
  Administered 2016-06-20 – 2016-06-21 (×2): 10 mg via ORAL
  Filled 2016-06-20 (×3): qty 1

## 2016-06-20 MED ORDER — ACETAMINOPHEN 325 MG PO TABS
650.0000 mg | ORAL_TABLET | Freq: Four times a day (QID) | ORAL | Status: DC | PRN
  Administered 2016-06-20 – 2016-06-21 (×2): 650 mg via ORAL
  Filled 2016-06-20: qty 2

## 2016-06-20 MED ORDER — DIFLUPREDNATE 0.05 % OP EMUL: 1.0000 [drp] | Freq: Three times a day (TID) | OPHTHALMIC | Status: DC

## 2016-06-20 MED ORDER — GATIFLOXACIN 0.5 % OP SOLN
1.0000 [drp] | Freq: Four times a day (QID) | OPHTHALMIC | Status: DC
  Filled 2016-06-20: qty 2.5

## 2016-06-20 MED ORDER — NEPAFENAC 0.3 % OP SUSP: 1.0000 [drp] | Freq: Every day | OPHTHALMIC | Status: DC

## 2016-06-20 MED ORDER — PANTOPRAZOLE SODIUM 40 MG PO TBEC
40.0000 mg | DELAYED_RELEASE_TABLET | Freq: Every day | ORAL | Status: DC
  Administered 2016-06-21: 40 mg via ORAL
  Filled 2016-06-20: qty 1

## 2016-06-20 MED ORDER — ADULT MULTIVITAMIN W/MINERALS CH
1.0000 | ORAL_TABLET | Freq: Every day | ORAL | Status: DC
  Administered 2016-06-21: 1 via ORAL
  Filled 2016-06-20: qty 1

## 2016-06-20 MED ORDER — FUROSEMIDE 10 MG/ML IJ SOLN
60.0000 mg | Freq: Once | INTRAMUSCULAR | Status: AC
  Administered 2016-06-20: 60 mg via INTRAVENOUS
  Filled 2016-06-20: qty 8

## 2016-06-20 MED ORDER — ENOXAPARIN SODIUM 40 MG/0.4ML ~~LOC~~ SOLN
40.0000 mg | SUBCUTANEOUS | Status: DC
  Administered 2016-06-20: 40 mg via SUBCUTANEOUS
  Filled 2016-06-20: qty 0.4

## 2016-06-20 MED ORDER — ALBUTEROL SULFATE (2.5 MG/3ML) 0.083% IN NEBU: 2.5000 mg | INHALATION_SOLUTION | Freq: Four times a day (QID) | RESPIRATORY_TRACT | Status: DC | PRN

## 2016-06-20 MED ORDER — SODIUM CHLORIDE 0.9% FLUSH: 3.0000 mL | INTRAVENOUS | Status: DC | PRN

## 2016-06-20 MED ORDER — ALBUTEROL SULFATE HFA 108 (90 BASE) MCG/ACT IN AERS: 2.0000 | INHALATION_SPRAY | Freq: Four times a day (QID) | RESPIRATORY_TRACT | Status: DC | PRN

## 2016-06-20 MED ORDER — RANOLAZINE ER 500 MG PO TB12
500.0000 mg | ORAL_TABLET | Freq: Two times a day (BID) | ORAL | Status: DC
  Administered 2016-06-20 – 2016-06-21 (×2): 500 mg via ORAL
  Filled 2016-06-20 (×2): qty 1

## 2016-06-20 MED ORDER — NEPAFENAC 0.1 % OP SUSP
1.0000 [drp] | Freq: Three times a day (TID) | OPHTHALMIC | Status: DC
  Filled 2016-06-20: qty 3

## 2016-06-20 MED ORDER — SODIUM CHLORIDE 0.9 % IV SOLN: 250.0000 mL | INTRAVENOUS | Status: DC | PRN

## 2016-06-20 MED ORDER — METOPROLOL TARTRATE 50 MG PO TABS
50.0000 mg | ORAL_TABLET | Freq: Two times a day (BID) | ORAL | Status: DC
  Administered 2016-06-20 – 2016-06-21 (×2): 50 mg via ORAL
  Filled 2016-06-20 (×2): qty 1

## 2016-06-20 MED ORDER — PRAZOSIN HCL 1 MG PO CAPS: 1.0000 mg | ORAL_CAPSULE | Freq: Every day | ORAL | Status: DC

## 2016-06-20 MED ORDER — ASPIRIN EC 81 MG PO TBEC
81.0000 mg | DELAYED_RELEASE_TABLET | Freq: Every day | ORAL | Status: DC
  Administered 2016-06-21: 81 mg via ORAL
  Filled 2016-06-20: qty 1

## 2016-06-20 MED ORDER — SODIUM CHLORIDE 0.9% FLUSH
3.0000 mL | Freq: Two times a day (BID) | INTRAVENOUS | Status: DC
  Administered 2016-06-20 – 2016-06-21 (×3): 3 mL via INTRAVENOUS

## 2016-06-20 MED ORDER — ASPIRIN 81 MG PO CHEW
324.0000 mg | CHEWABLE_TABLET | Freq: Once | ORAL | Status: AC
  Administered 2016-06-20: 324 mg via ORAL
  Filled 2016-06-20: qty 4

## 2016-06-20 MED ORDER — PRAZOSIN HCL 1 MG PO CAPS
1.0000 mg | ORAL_CAPSULE | Freq: Two times a day (BID) | ORAL | Status: DC
  Administered 2016-06-20 – 2016-06-21 (×2): 1 mg via ORAL
  Filled 2016-06-20 (×3): qty 1

## 2016-06-20 NOTE — ED Provider Notes (Signed)
[facility] Emergency Department Provider Note  ____________________________________________   I have reviewed the triage vital signs and the nursing notes.   HISTORY  Chief Complaint Chest Pain    HPI Rose [name] is a 82 y.o. female presents today complaining of shortness of breath. Patient has a history of a heart murmur, hypertension hyperlipidemia and heart failure for which she was admitted in summer of 2016. Marland Kitchen She states over the last couple days she's had increasing orthopnea and dyspnea on exertion and occasional brief episodes of left-sided chest pain which does not radiate. It is squeezing. It does seem to get better when she stops moving around. Patient did have a recent stress test that was negative, she does have chronic chest pain according to prior notes. Also history of chronic anemia. Patient denies any fever or chills. Denies any leg swelling although she has had in the past. Patient does have a history of remote ablation for atrial fibrillation. She does take Lasix.     Past Medical History:  Diagnosis Date  . Allergic rhinitis   . Anemia   . Arrhythmia    s/p ablation for A-fib  . Carpal tunnel syndrome   . GERD (gastroesophageal reflux disease)   . Heart murmur   . Hyperlipidemia   . Hypertension   . Lumbar spine pain   . Nephrolithiasis   . Osteoarthritis   . Sleep apnea   . TIA (transient ischemic attack)     Patient Active Problem List   Diagnosis Date Noted  . Acute exacerbation of CHF (congestive heart failure) (HCC) 10/13/2015  . SOB (shortness of breath) 08/05/2011  . HTN (hypertension) 08/05/2011  . Hyperlipidemia 08/05/2011  . PVD (peripheral vascular disease) (HCC) 08/05/2011  . CAD (coronary artery disease) 08/05/2011    Past Surgical History:  Procedure Laterality Date  . ABDOMINAL HYSTERECTOMY    . ablation  January 2011   A-fib  . CARDIAC CATHETERIZATION    . ESOPHAGOGASTRODUODENOSCOPY N/A 05/22/2015   Procedure: ESOPHAGOGASTRODUODENOSCOPY (EGD);  Surgeon: Scot Jun, MD;  Location: [facility] ENDOSCOPY;  Service: Endoscopy;  Laterality: N/A;  . RENAL ARTERY STENT    . REPLACEMENT TOTAL KNEE BILATERAL    . SAVORY DILATION N/A 05/22/2015   Procedure: SAVORY DILATION;  Surgeon: Scot Jun, MD;  Location: [facility] ENDOSCOPY;  Service: Endoscopy;  Laterality: N/A;  . TONSILLECTOMY      Current Outpatient Rx  . Order #: [id number] Class: Historical Med  . Order #: [id number] Class: Historical Med  . Order #: [id number] Class: Historical Med  . Order #: [id number] Class: Historical Med  . Order #: [id number] Class: Print  . Order #: [id number] Class: Historical Med  . Order #: [id number] Class: Print  . Order #: [id number] Class: Print  . Order #: [id number] Class: Historical Med  . Order #: [id number] Class: Historical Med  . Order #: [id number] Class: Historical Med  . Order #: [id number] Class: Historical Med  . Order #: [id number] Class: Historical Med  . Order #: [id number] Class: Historical Med    Allergies Avapro [irbesartan]; Lisinopril; Plavix [clopidogrel bisulfate]; Pravachol; and Ultram [tramadol hcl]  No family history on file.  Social History Social History  Substance Use Topics  . Smoking status: Never Smoker  . Smokeless tobacco: Never Used  . Alcohol use No    Review of Systems }Constitutional: No fever/chills Eyes: No visual changes. ENT: No sore throat. No stiff neck no neck pain Cardiovascular: Positive chest pain. Respiratory: Positive shortness of breath. Gastrointestinal:   no vomiting.  No diarrhea.  No  constipation. Genitourinary: Negative for dysuria. Musculoskeletal: Negative lower extremity swelling Skin: Negative for rash. Neurological: Negative for severe headaches, focal weakness or numbness. 10-point ROS otherwise negative.  ____________________________________________   PHYSICAL EXAM:  VITAL SIGNS: ED Triage Vitals  Enc Vitals Group     BP 06/20/16 1017 (!)  141/72     Pulse Rate 06/20/16 1017 83     Resp 06/20/16 1017 20     Temp 06/20/16 1017 98.1 F (36.7 C)     Temp Source 06/20/16 1017 Oral     SpO2 06/20/16 1017 96 %     Weight 06/20/16 1017 199 lb (90.3 kg)     Height 06/20/16 1017 5\' 5"  (1.651 m)     Head Circumference --      Peak Flow --      Pain Score 06/20/16 1018 0     Pain Loc --      Pain Edu? --      Excl. in GC? --     Constitutional: Alert and oriented. Well appearing and in no acute distress. Eyes: Conjunctivae are normal. PERRL. EOMI. Head: Atraumatic. Nose: No congestion/rhinnorhea. Mouth/Throat: Mucous membranes are moist.  Oropharynx non-erythematous. Neck: No stridor.   Nontender with no meningismus Cardiovascular: Normal rate, regular rhythm. Grossly normal heart sounds.  Good peripheral circulation. Respiratory: Normal respiratory effort.  No retractions. Lungs CTAB. Abdominal: Soft and nontender. No distention. No guarding no rebound Back:  There is no focal tenderness or step off.  there is no midline tenderness there are no lesions noted. there is no CVA tenderness Musculoskeletal: No lower extremity tenderness, no upper extremity tenderness. No joint effusions, no DVT signs strong distal pulses no edema Neurologic:  Normal speech and language. No gross focal neurologic deficits are appreciated.  Skin:  Skin is warm, dry and intact. No rash noted. Psychiatric: Mood and affect are normal. Speech and behavior are normal.  ____________________________________________   LABS (all labs ordered are listed, but only abnormal results are displayed)  Labs Reviewed  BRAIN NATRIURETIC PEPTIDE - Abnormal; Notable for the following:       Result Value   B Natriuretic Peptide 331.0 (*)    All other components within normal limits  CBC WITH DIFFERENTIAL/PLATELET - Abnormal; Notable for the following:    Hemoglobin 11.1 (*)    HCT 33.3 (*)    RDW 15.0 (*)    Neutro Abs 7.7 (*)    All other components within  normal limits  COMPREHENSIVE METABOLIC PANEL - Abnormal; Notable for the following:    Chloride 98 (*)    Creatinine, Ser 1.18 (*)    GFR calc non Af Amer 41 (*)    GFR calc Af Amer 48 (*)    All other components within normal limits  TROPONIN I  PROTIME-INR  APTT  CBC WITH DIFFERENTIAL/PLATELET   ____________________________________________  EKG  I personally interpreted any EKGs ordered by me or triage Normal sinus rhythm rate 85 bpm, no acute ST elevation or depression on standing ST changes LAD noted ____________________________________________  RADIOLOGY  I reviewed any imaging ordered by me or triage that were performed during my shift and, if possible, patient and/or family made aware of any abnormal findings. ____________________________________________   PROCEDURES  Procedure(s) performed: None  Procedures  Critical Care performed: None  ____________________________________________   INITIAL IMPRESSION / ASSESSMENT AND PLAN / ED COURSE  Pertinent labs & imaging results that were available during my care of the patient were reviewed by me and  considered in my medical decision making (see chart for details).  Patient with a history of CAD according to prior notes, as well as CHF presents today with chest pain and shortness of breath. She is not acting any chest pain at this time. She is however suffering from orthopnea and exertional dyspnea. We will give her IV Lasix here. We have given her aspirin. No evidence of acute myocardial infarction.   Clinical Course   ____________________________________________   FINAL CLINICAL IMPRESSION(S) / ED DIAGNOSES  Final diagnoses:  None      This chart was dictated using voice recognition software.  Despite best efforts to proofread,  errors can occur which can change meaning.      Jeanmarie Plant, MD 06/20/16 [phone]

## 2016-06-20 NOTE — Progress Notes (Signed)
Pt. admitted to unit, [id number] from [facility] Oriented to room, call bell, Ascom phones and staff. Bed in low position. Fall safety plan reviewed, brown non-skid socks in place. Full assessment to Epic; skin assessed with Raeanne GathersAshley W., RN. Telemetry box verified with CCMD and Sydney NT: [id number] . Will continue to monitor.

## 2016-06-20 NOTE — Consult Note (Signed)
[facility] Consultation [facility]  Patient ID: Rose ResMaggie G Sciascia, MRN: [id number], DOB/AGE: 80/04/1930 80 y.o. Admit date: 06/20/2016   Date of Consult: 06/20/2016 Primary Physician: Leanna SatoMILES,LINDA M, MD Primary Cardiologist: [facility]  Chief Complaint:  Chief Complaint  Patient presents with  . Chest Pain   Reason for Consult: acute on chronic diastolic dysfunction congestive heart failure  HPI: 82 y.o. female with the known previous minimal coronary artery disease by previous cardiac catheterization several years prior with known essential hypertension makes hyperlipidemia and history of atrial fibrillation status post previous ablation. Patient has had an ejection fraction of 60% in the past although recently has had new concerns of significant shortness of breath with and without physical activity Consistent with also episodes of PND and orthopnea. She does have the this seen in the emergency room despite appropriate treatment with furosemide. At that time she had an EKG showing normal sinus rhythm and a normal troponin without evidence of myocardial infarction. She has now felt much better at this time with appropriate medication management. Additionally she has had appropriate medication management for hypertension including hydralazine metoprolol. Additionally she has had sleep apnea and does claim that she is using CPAP machine. Currently she has some improvements with the current treatment  Past Medical History:  Diagnosis Date  . Allergic rhinitis   . Anemia   . Arrhythmia    s/p ablation for A-fib  . Carpal tunnel syndrome   . GERD (gastroesophageal reflux disease)   . Heart murmur   . Hyperlipidemia   . Hypertension   . Lumbar spine pain   . Nephrolithiasis   . Osteoarthritis   . Sleep apnea   . TIA (transient ischemic attack)       Surgical History:  Past Surgical History:  Procedure Laterality Date  . ABDOMINAL HYSTERECTOMY    . ablation  January 2011   A-fib  .  CARDIAC CATHETERIZATION    . ESOPHAGOGASTRODUODENOSCOPY N/A 05/22/2015   Procedure: ESOPHAGOGASTRODUODENOSCOPY (EGD);  Surgeon: Scot Junobert T Elliott, MD;  [facility];  Service: Endoscopy;  Laterality: N/A;  . RENAL ARTERY STENT    . REPLACEMENT TOTAL KNEE BILATERAL    . SAVORY DILATION N/A 05/22/2015   Procedure: SAVORY DILATION;  Surgeon: Scot Junobert T Elliott, MD;  [facility];  Service: Endoscopy;  Laterality: N/A;  . TONSILLECTOMY       Home Meds: Prior to Admission medications   Medication Sig Start Date End Date Taking? Authorizing Provider  albuterol (PROVENTIL HFA;VENTOLIN HFA) 108 (90 BASE) MCG/ACT inhaler Inhale 2 puffs into the lungs every 6 (six) hours as needed for wheezing or shortness of breath.   Yes Historical Provider, MD  aspirin EC 81 MG tablet Take 81 mg by mouth daily.   Yes Historical Provider, MD  docusate sodium (COLACE) 100 MG capsule Take 100 mg by mouth 2 (two) times daily.   Yes Historical Provider, MD  furosemide (LASIX) 40 MG tablet Take 1 tablet (40 mg total) by mouth daily. 10/15/15  Yes Alford Highlandichard Wieting, MD  Garlic 1000 MG CAPS Take 1,000 mg by mouth every evening.    Yes Historical Provider, MD  hydrALAZINE (APRESOLINE) 10 MG tablet Take 1 tablet (10 mg total) by mouth 2 (two) times daily. 10/15/15  Yes Alford Highlandichard Wieting, MD  metoprolol (LOPRESSOR) 50 MG tablet Take 1 tablet (50 mg total) by mouth 2 (two) times daily. 10/15/15  Yes Alford Highlandichard Wieting, MD  Multiple Vitamin (MULTIVITAMIN) tablet Take 1 tablet by mouth daily.  Yes Historical Provider, MD  omeprazole (PRILOSEC) 20 MG capsule Take 20 mg by mouth daily.   Yes Historical Provider, MD  Potassium 99 MG TABS Take 99 mg by mouth at bedtime.    Yes Historical Provider, MD  prazosin (MINIPRESS) 1 MG capsule Take 1 mg by mouth 2 (two) times daily.    Yes Historical Provider, MD  ranolazine (RANEXA) 500 MG 12 hr tablet Take 500 mg by mouth 2 (two) times daily.   Yes Historical Provider, MD     Inpatient Medications:  . aspirin EC  81 mg Oral Daily  . enoxaparin (LOVENOX) injection  40 mg Subcutaneous Q24H  . furosemide  20 mg Intravenous Q12H  . hydrALAZINE  10 mg Oral BID  . metoprolol  50 mg Oral BID  . [START ON 06/21/2016] multivitamin with minerals  1 tablet Oral Daily  . [START ON 06/21/2016] pantoprazole  40 mg Oral Daily  . prazosin  1 mg Oral BID  . ranolazine  500 mg Oral BID  . sodium chloride flush  3 mL Intravenous Q12H      Allergies:  Allergies  Allergen Reactions  . Avapro [Irbesartan]   . Lisinopril   . Plavix [Clopidogrel Bisulfate]   . Pravachol   . Ultram [Tramadol Hcl]     Social History   Social History  . Marital status: Widowed    Spouse name: N/A  . Number of children: N/A  . Years of education: N/A   Occupational History  . Not on file.   Social History Main Topics  . Smoking status: Never Smoker  . Smokeless tobacco: Never Used  . Alcohol use No  . Drug use: Unknown  . Sexual activity: Not on file   Other Topics Concern  . Not on file   Social History Narrative  . No narrative on file     No family history on file.   Review of Systems Positive for Shortness of breath chest pain irregular heartbeat Negative for: General:  chills, fever, night sweats or weight changes.  Cardiovascular: Positive for PND orthopnea negative for syncope dizziness  Dermatological skin lesions rashes Respiratory: Cough congestion Urologic: Frequent urination urination at night and hematuria Abdominal: negative for nausea, vomiting, diarrhea, bright red blood per rectum, melena, or hematemesis Neurologic: negative for visual changes, and/or hearing changes  All other systems reviewed and are otherwise negative except as noted above.  Labs:  Recent Labs  06/20/16 1207  TROPONINI <0.03   Lab Results  Component Value Date   WBC 9.6 06/20/2016   HGB 11.1 (L) 06/20/2016   HCT 33.3 (L) 06/20/2016   MCV 84.6 06/20/2016   PLT 216  06/20/2016    Recent Labs Lab 06/20/16 1207  NA 136  K 3.9  CL 98*  CO2 30  BUN 20  CREATININE 1.18*  CALCIUM 9.2  PROT 7.2  BILITOT 1.0  ALKPHOS 51  ALT 20  AST 22  GLUCOSE 94   No results found for: CHOL, HDL, LDLCALC, TRIG No results found for: DDIMER  Radiology/Studies:  Dg Chest 2 View  Result Date: 06/20/2016 CLINICAL DATA:  Intermittent mid sternal chest pain with shortness of breath for week especially when walking around the house, history CHF, hypertension, GERD EXAM: CHEST  2 VIEW COMPARISON:  10/13/2015 FINDINGS: Enlargement of cardiac silhouette. Atherosclerotic calcification aorta. Slight prominent central pulmonary arteries question pulmonary arterial hypertension. Bronchitic changes without infiltrate, pleural effusion or pneumothorax. Bones demineralized with evidence of prior lumbar spine surgery. IMPRESSION:  Enlargement of cardiac silhouette with question pulmonary arterial hypertension. Aortic atherosclerosis. Electronically Signed   By: Ulyses Southward M.D.   On: 06/20/2016 11:40    EKG: Normal sinus rhythm  Weights: Filed Weights   06/20/16 1017 06/20/16 1540  Weight: 199 lb (90.3 kg) 201 lb 9.6 oz (91.4 kg)     Physical Exam: Blood pressure (!) 146/70, pulse 79, temperature 97.9 F (36.6 C), temperature source Oral, resp. rate 20, height 5\' 5"  (1.651 m), weight 201 lb 9.6 oz (91.4 kg), SpO2 97 %. Body mass index is 33.55 kg/m. General: Well developed, well nourished, in no acute distress. Head eyes ears nose throat: Normocephalic, atraumatic, sclera non-icteric, no xanthomas, nares are without discharge. No apparent thyromegaly and/or mass  Lungs: Normal respiratory effort.  no wheezes basilar rales, no rhonchi.  Heart: RRR with normal S1 soft S2. 3+ right upper sternal border murmur gallop, no rub, PMI is normal size and placement, carotid upstroke normal with  bruit, jugular venous pressure is normal Abdomen: Soft, non-tender, non-distended with  normoactive bowel sounds. No hepatomegaly. No rebound/guarding. No obvious abdominal masses. Abdominal aorta is normal size without bruit Extremities: 1+ edema. no cyanosis, no clubbing, no ulcers  Peripheral : 2+ bilateral upper extremity pulses, 2+ bilateral femoral pulses, 2+ bilateral dorsal pedal pulse Neuro: Alert and oriented. No facial asymmetry. No focal deficit. Moves all extremities spontaneously. Musculoskeletal: Normal muscle tone without kyphosis Psych:  Responds to questions appropriately with a normal affect.    Assessment: 82 year old female with acute on chronic diastolic dysfunction congestive heart failure coronary artery disease without myocardial infarction hypertension and hyperlipidemia needing further treatment options  Plan: 1. Continue intravenous Lasix for diastolic dysfunction and lower extremity edema pulmonary edema 2. Prolonged hydralazine for hypertension control and adjustments as necessary for goal systolic blood pressure below 161 mm 3. Echocardiogram for LV systolic dysfunction and worsening valvular heart disease with an aortic valve stenosis murmur and further treatment options 4. Dietary instructions for further treatment of concerns of high salt diet 5. Further treatment options after above  Signed, Lamar Blinks M.D. [facility] Cardiology 06/20/2016, 5:07 PM

## 2016-06-20 NOTE — Care Management (Signed)
Presents from home with with chest pain and progressive shortness of breath.  Placed in observation.  Negative troponins, cardiology consult is pending.  Independent in her adls and denies issues accessing medical care, meds or with transportation

## 2016-06-20 NOTE — ED Notes (Signed)
Lab in to collect labwork. Pt states that they were successful.

## 2016-06-20 NOTE — ED Notes (Signed)
Admitting MD at bedside.

## 2016-06-20 NOTE — ED Notes (Signed)
Pt back from x-ray.

## 2016-06-20 NOTE — ED Triage Notes (Signed)
Pt states for a week now, she has been having intermittent midsternal cp with sob when she gets up and walks around the house. In NAD. States minimal cp at this time.

## 2016-06-20 NOTE — ED Notes (Addendum)
Pt nurse unable take report at this time, in another room. Asked if she could call back. Explained that patient bed has been ready and need to given report. States that she will call back.

## 2016-06-20 NOTE — ED Notes (Addendum)
Pt stuck X 4 total by 4 different staff members for bloodwork, only purple and green top obtained by Florentina AddisonKatie, RN via straight stick with butterfly. Lab informed.

## 2016-06-20 NOTE — ED Notes (Signed)
Family at bedside. 

## 2016-06-20 NOTE — ED Notes (Signed)
Lab at bedside

## 2016-06-20 NOTE — H&P (Signed)
[facility] [place] at [facility]   PATIENT NAME: Rose [name] Albano    MR#:  [id number]  DATE OF BIRTH:  07/05/1930  DATE OF ADMISSION:  06/20/2016  PRIMARY CARE PHYSICIAN: Leanna SatoMILES,LINDA M, MD   REQUESTING/REFERRING PHYSICIAN: Ileana RoupJames MCShane MD  CHIEF COMPLAINT:   Chief Complaint  Patient presents with  . Chest Pain    HISTORY OF PRESENT ILLNESS: Rose [name] Swire  is a 82 y.o. female with a known history of Atrial fibrillation, GERD, hyperlipidemia, hypertension, sleep apnea and a history of TIA who presents with worsening shortness of breath. Chest pain. Patient reports that she's been having shortness of breath for a long time. Has progressively gotten worse. She does use oxygen at nighttime. She is diagnosed with sleep apnea and does not wear her CPAP. Patient presents with complaint of having chest pain as well. Since last Thursday. She describes it as a pressure-like sensation. It occurs with rest and activity. She has limited mobility due to shortness of breath. She otherwise denies lower extremity swelling. She had a chest x-ray which showed cardiomegaly and findings suggestive of possible pulmonary hypertension. PAST MEDICAL HISTORY:   Past Medical History:  Diagnosis Date  . Allergic rhinitis   . Anemia   . Arrhythmia    s/p ablation for A-fib  . Carpal tunnel syndrome   . GERD (gastroesophageal reflux disease)   . Heart murmur   . Hyperlipidemia   . Hypertension   . Lumbar spine pain   . Nephrolithiasis   . Osteoarthritis   . Sleep apnea   . TIA (transient ischemic attack)     PAST SURGICAL HISTORY: Past Surgical History:  Procedure Laterality Date  . ABDOMINAL HYSTERECTOMY    . ablation  January 2011   A-fib  . CARDIAC CATHETERIZATION    . ESOPHAGOGASTRODUODENOSCOPY N/A 05/22/2015   Procedure: ESOPHAGOGASTRODUODENOSCOPY (EGD);  Surgeon: Scot Junobert T Elliott, MD;  [facility]COPY;  Service: Endoscopy;  Laterality: N/A;  . RENAL ARTERY STENT     . REPLACEMENT TOTAL KNEE BILATERAL    . SAVORY DILATION N/A 05/22/2015   Procedure: SAVORY DILATION;  Surgeon: Scot Junobert T Elliott, MD;  Location[facility] ENDOSCOPY;  Service: Endoscopy;  Laterality: N/A;  . TONSILLECTOMY      SOCIAL HISTORY:  Social History  Substance Use Topics  . Smoking status: Never Smoker  . Smokeless tobacco: Never Used  . Alcohol use No    FAMILY HISTORY: No family history on file.  DRUG ALLERGIES:  Allergies  Allergen Reactions  . Avapro [Irbesartan]   . Lisinopril   . Plavix [Clopidogrel Bisulfate]   . Pravachol   . Ultram [Tramadol Hcl]     REVIEW OF SYSTEMS:   CONSTITUTIONAL: No fever, Positive fatigue and weakness.  EYES: No blurred or double vision.  EARS, NOSE, AND THROAT: No tinnitus or ear pain.  RESPIRATORY: No cough, positive shortness of breath, wheezing or hemoptysis.  CARDIOVASCULAR: Positive chest pain, no orthopnea, edema.  GASTROINTESTINAL: No nausea, vomiting, diarrhea or abdominal pain.  GENITOURINARY: No dysuria, hematuria.  ENDOCRINE: No polyuria, nocturia,  HEMATOLOGY: No anemia, easy bruising or bleeding SKIN: No rash or lesion. MUSCULOSKELETAL: No joint pain or arthritis.   NEUROLOGIC: No tingling, numbness, weakness.  PSYCHIATRY: No anxiety or depression.   MEDICATIONS AT HOME:  Prior to Admission medications   Medication Sig Start Date End Date Taking? Authorizing Provider  albuterol (PROVENTIL HFA;VENTOLIN HFA) 108 (90 BASE) MCG/ACT inhaler Inhale 2 puffs into the lungs every 6 (six) hours as  needed for wheezing or shortness of breath.   Yes Historical Provider, MD  aspirin EC 81 MG tablet Take 81 mg by mouth daily.   Yes Historical Provider, MD  docusate sodium (COLACE) 100 MG capsule Take 100 mg by mouth 2 (two) times daily.   Yes Historical Provider, MD  furosemide (LASIX) 40 MG tablet Take 1 tablet (40 mg total) by mouth daily. 10/15/15  Yes Alford Highland, MD  Garlic 1000 MG CAPS Take 1,000 mg by mouth every evening.     Yes Historical Provider, MD  hydrALAZINE (APRESOLINE) 10 MG tablet Take 1 tablet (10 mg total) by mouth 2 (two) times daily. 10/15/15  Yes Alford Highland, MD  metoprolol (LOPRESSOR) 50 MG tablet Take 1 tablet (50 mg total) by mouth 2 (two) times daily. 10/15/15  Yes Alford Highland, MD  Multiple Vitamin (MULTIVITAMIN) tablet Take 1 tablet by mouth daily.   Yes Historical Provider, MD  omeprazole (PRILOSEC) 20 MG capsule Take 20 mg by mouth daily.   Yes Historical Provider, MD  Potassium 99 MG TABS Take 99 mg by mouth at bedtime.    Yes Historical Provider, MD  prazosin (MINIPRESS) 1 MG capsule Take 1 mg by mouth 2 (two) times daily.    Yes Historical Provider, MD  ranolazine (RANEXA) 500 MG 12 hr tablet Take 500 mg by mouth 2 (two) times daily.   Yes Historical Provider, MD      PHYSICAL EXAMINATION:   VITAL SIGNS: Blood pressure 129/65, pulse 81, temperature 98.1 F (36.7 C), temperature source Oral, resp. rate (!) 22, height 5\' 5"  (1.651 m), weight 90.3 kg (199 lb), SpO2 96 %.  GENERAL:  82 y.o.-year-old patient lying in the bed with no acute distress.  EYES: Pupils equal, round, reactive to light and accommodation. No scleral icterus. Extraocular muscles intact.  HEENT: Head atraumatic, normocephalic. Oropharynx and nasopharynx clear.  NECK:  Supple, no jugular venous distention. No thyroid enlargement, no tenderness.  LUNGS: Normal breath sounds bilaterally, no wheezing, rales,rhonchi or crepitation. No use of accessory muscles of respiration.  CARDIOVASCULAR: S1, S2 normal. Positive systolic murmur murmurs, rubs, or gallops.  ABDOMEN: Soft, nontender, nondistended. Bowel sounds present. No organomegaly or mass.  EXTREMITIES: Trace edema, no cyanosis, or clubbing.  NEUROLOGIC: Cranial nerves II through XII are intact. Muscle strength 5/5 in all extremities. Sensation intact. Gait not checked.  PSYCHIATRIC: The patient is alert and oriented x 3.  SKIN: No obvious rash, lesion, or ulcer.    LABORATORY PANEL:   CBC  Recent Labs Lab 06/20/16 1207  WBC 9.6  HGB 11.1*  HCT 33.3*  PLT 216  MCV 84.6  MCH 28.2  MCHC 33.3  RDW 15.0*  LYMPHSABS 1.2  MONOABS 0.6  EOSABS 0.1  BASOSABS 0.0   ------------------------------------------------------------------------------------------------------------------  Chemistries   Recent Labs Lab 06/20/16 1207  NA 136  K 3.9  CL 98*  CO2 30  GLUCOSE 94  BUN 20  CREATININE 1.18*  CALCIUM 9.2  AST 22  ALT 20  ALKPHOS 51  BILITOT 1.0   ------------------------------------------------------------------------------------------------------------------ estimated creatinine clearance is 39.4 mL/min (by C-G formula based on SCr of 1.18 mg/dL). ------------------------------------------------------------------------------------------------------------------ No results for input(s): TSH, T4TOTAL, T3FREE, THYROIDAB in the last 72 hours.  Invalid input(s): FREET3   Coagulation profile  Recent Labs Lab 06/20/16 1240  INR 1.00   ------------------------------------------------------------------------------------------------------------------- No results for input(s): DDIMER in the last 72 hours. -------------------------------------------------------------------------------------------------------------------  Cardiac Enzymes  Recent Labs Lab 06/20/16 1207  TROPONINI <0.03   ------------------------------------------------------------------------------------------------------------------ Invalid input(s):  POCBNP  ---------------------------------------------------------------------------------------------------------------  Urinalysis    Component Value Date/Time   COLORURINE Yellow 12/13/2014 0956   APPEARANCEUR Hazy 12/13/2014 0956   LABSPEC 1.012 12/13/2014 0956   PHURINE 6.0 12/13/2014 0956   GLUCOSEU Negative 12/13/2014 0956   HGBUR Negative 12/13/2014 0956   BILIRUBINUR Negative 12/13/2014 0956    KETONESUR Negative 12/13/2014 0956   PROTEINUR Negative 12/13/2014 0956   NITRITE Negative 12/13/2014 0956   LEUKOCYTESUR Negative 12/13/2014 0956     RADIOLOGY: Dg Chest 2 View  Result Date: 06/20/2016 CLINICAL DATA:  Intermittent mid sternal chest pain with shortness of breath for week especially when walking around the house, history CHF, hypertension, GERD EXAM: CHEST  2 VIEW COMPARISON:  10/13/2015 FINDINGS: Enlargement of cardiac silhouette. Atherosclerotic calcification aorta. Slight prominent central pulmonary arteries question pulmonary arterial hypertension. Bronchitic changes without infiltrate, pleural effusion or pneumothorax. Bones demineralized with evidence of prior lumbar spine surgery. IMPRESSION: Enlargement of cardiac silhouette with question pulmonary arterial hypertension. Aortic atherosclerosis. Electronically Signed   By: Ulyses Southward M.D.   On: 06/20/2016 11:40    EKG: Orders placed or performed during the hospital encounter of 06/20/16  . EKG 12-Lead  . EKG 12-Lead    IMPRESSION AND PLAN: Patient is a 82 year old African-American female presents with shortness of breath and chest pain  1. Chest pain Has a history of angina in the past. We'll place under observation. He'll cardiology evaluate for any further input Continue aspirin, metoprolol We'll do when necessary nitroglycerin She is already on Ranexa  2. Shortness of breath I suspect this to be more RELATED to possible pulmonary hypertension and congestive heart failure We'll try IV Lasix, low-dose Echocardiogram of the heart further evaluate for pulmonary hypertension and the valve regurgitation  3. GERD Continue omeprazole  4. Hypertension Continue therapy with hydralazine, metoprolol and prazosin  5. Misc: lovenox    All the records are reviewed and case discussed with ED provider. Management plans discussed with the patient, family and they are in agreement.  CODE STATUS:    Code Status  Orders        Start     Ordered   06/20/16 1421  Full code  Continuous     06/20/16 1421    Code Status History    Date Active Date Inactive Code Status Order ID Comments User Context   10/13/2015  6:55 PM 10/15/2015  3:14 PM Full Code [id number]  Ramonita Lab, MD Inpatient       TOTAL TIME TAKING CARE OF THIS PATIENT:66minutes.    Auburn Bilberry M.D on 06/20/2016 at 3:31 PM  Between 7am to 6pm - Pager - [phone]  After 6pm go to [email or web address] - password [email or web address] [facility]  [place] [facility]  Office  [phone]  CC: Primary care physician; Leanna Sato, MD

## 2016-06-20 NOTE — ED Notes (Signed)
Report to Mattie, RN

## 2016-06-20 NOTE — Care Management Obs Status (Signed)
MEDICARE OBSERVATION STATUS NOTIFICATION   Patient Details  Name: Rose [name] MRN: [id number] Date of Birth: 01/06/1930   Medicare Observation Status Notification Given:  Yes    Eber HongGreene, Aashish Hamm R, RN 06/20/2016, 3:48 PM

## 2016-06-20 NOTE — ED Notes (Signed)
Iv attempt by this RN unsuccessful X 1. Attempt by medic, Les X 1 unsuccessful. Pt alert and oriented X4, active, cooperative, pt in NAD. RR even and unlabored, color WNL.

## 2016-06-21 LAB — BASIC METABOLIC PANEL
ANION GAP: 8 (ref 5–15)
BUN: 32 mg/dL — ABNORMAL HIGH (ref 6–20)
CALCIUM: 8.6 mg/dL — AB (ref 8.9–10.3)
CHLORIDE: 97 mmol/L — AB (ref 101–111)
CO2: 30 mmol/L (ref 22–32)
Creatinine, Ser: 1.81 mg/dL — ABNORMAL HIGH (ref 0.44–1.00)
GFR calc non Af Amer: 25 mL/min — ABNORMAL LOW (ref >60)
GFR, EST AFRICAN AMERICAN: 28 mL/min — AB (ref >60)
Glucose, Bld: 119 mg/dL — ABNORMAL HIGH (ref 65–99)
Potassium: 3.9 mmol/L (ref 3.5–5.1)
SODIUM: 135 mmol/L (ref 135–145)

## 2016-06-21 LAB — CBC
HEMATOCRIT: 29.2 % — AB (ref 35.0–47.0)
HEMOGLOBIN: 9.7 g/dL — AB (ref 12.0–16.0)
MCH: 28.2 pg (ref 26.0–34.0)
MCHC: 33.3 g/dL (ref 32.0–36.0)
MCV: 84.6 fL (ref 80.0–100.0)
Platelets: 202 10*3/uL (ref 150–440)
RBC: 3.45 MIL/uL — ABNORMAL LOW (ref 3.80–5.20)
RDW: 15.1 % — AB (ref 11.5–14.5)
WBC: 9.7 10*3/uL (ref 3.6–11.0)

## 2016-06-21 LAB — ECHOCARDIOGRAM COMPLETE
Height: 65 in
Weight: 3225.6 oz

## 2016-06-21 LAB — TROPONIN I

## 2016-06-21 MED ORDER — ENOXAPARIN SODIUM 30 MG/0.3ML ~~LOC~~ SOLN: 30.0000 mg | SUBCUTANEOUS | Status: DC

## 2016-06-21 NOTE — Discharge Summary (Signed)
[facility] - [place] at [facility]   PATIENT NAME: Rose [name]    MR#:  [id number]  DATE OF BIRTH:  08/17/1930  DATE OF ADMISSION:  06/20/2016 ADMITTING PHYSICIAN: Auburn Bilberry, MD  DATE OF DISCHARGE: 06/21/16  PRIMARY CARE PHYSICIAN: Leanna Sato, MD    ADMISSION DIAGNOSIS:  chest pain  DISCHARGE DIAGNOSIS:  Active Problems:   Chest pain acute on chronic diastolic dysfunction congestive heart failure  SECONDARY DIAGNOSIS:   Past Medical History:  Diagnosis Date  . Allergic rhinitis   . Anemia   . Arrhythmia    s/p ablation for A-fib  . Carpal tunnel syndrome   . GERD (gastroesophageal reflux disease)   . Heart murmur   . Hyperlipidemia   . Hypertension   . Lumbar spine pain   . Nephrolithiasis   . Osteoarthritis   . Sleep apnea   . TIA (transient ischemic attack)     HOSPITAL COURSE:  Rose [name]  is a 82 y.o. female admitted 06/20/2016 with chief complaint Chest Pain . Please see H&P performed by Auburn Bilberry, MD for further information. Patient presented to the hospital with the above symptoms cardiology consult and followed along during her stay. She received Lasix for IV diuresis with improvement of chest tightness and shortness of breath. Lower extremity edema somewhat improved as well.  DISCHARGE CONDITIONS:   Stable  CONSULTS OBTAINED:  Treatment Team:  Lamar Blinks, MD  DRUG ALLERGIES:   Allergies  Allergen Reactions  . Avapro [Irbesartan]   . Lisinopril   . Plavix [Clopidogrel Bisulfate]   . Pravachol   . Ultram [Tramadol Hcl]     DISCHARGE MEDICATIONS:   Current Discharge Medication List    CONTINUE these medications which have NOT CHANGED   Details  albuterol (PROVENTIL HFA;VENTOLIN HFA) 108 (90 BASE) MCG/ACT inhaler Inhale 2 puffs into the lungs every 6 (six) hours as needed for wheezing or shortness of breath.    aspirin EC 81 MG tablet Take 81 mg by mouth daily.    docusate sodium (COLACE) 100 MG  capsule Take 100 mg by mouth 2 (two) times daily.    furosemide (LASIX) 40 MG tablet Take 1 tablet (40 mg total) by mouth daily. Qty: 30 tablet, Refills: 0    Garlic 1000 MG CAPS Take 1,000 mg by mouth every evening.     hydrALAZINE (APRESOLINE) 10 MG tablet Take 1 tablet (10 mg total) by mouth 2 (two) times daily. Qty: 60 tablet, Refills: 0    metoprolol (LOPRESSOR) 50 MG tablet Take 1 tablet (50 mg total) by mouth 2 (two) times daily. Qty: 60 tablet, Refills: 0    Multiple Vitamin (MULTIVITAMIN) tablet Take 1 tablet by mouth daily.    omeprazole (PRILOSEC) 20 MG capsule Take 20 mg by mouth daily.    Potassium 99 MG TABS Take 99 mg by mouth at bedtime.     prazosin (MINIPRESS) 1 MG capsule Take 1 mg by mouth 2 (two) times daily.     ranolazine (RANEXA) 500 MG 12 hr tablet Take 500 mg by mouth 2 (two) times daily.         DISCHARGE INSTRUCTIONS:    DIET:  Cardiac diet  DISCHARGE CONDITION:  Stable  ACTIVITY:  Activity as tolerated  OXYGEN:  Home Oxygen: No.   Oxygen Delivery: room air  DISCHARGE LOCATION:  home   If you experience worsening of your admission symptoms, develop shortness of breath, life threatening emergency, suicidal or homicidal thoughts you must  seek medical attention immediately by calling 911 or calling your MD immediately  if symptoms less severe.  You Must read complete instructions/literature along with all the possible adverse reactions/side effects for all the Medicines you take and that have been prescribed to you. Take any new Medicines after you have completely understood and accpet all the possible adverse reactions/side effects.   Please note  You were cared for by a hospitalist during your hospital stay. If you have any questions about your discharge medications or the care you received while you were in the hospital after you are discharged, you can call the unit and asked to speak with the hospitalist on call if the hospitalist  that took care of you is not available. Once you are discharged, your primary care physician will handle any further medical issues. Please note that NO REFILLS for any discharge medications will be authorized once you are discharged, as it is imperative that you return to your primary care physician (or establish a relationship with a primary care physician if you do not have one) for your aftercare needs so that they can reassess your need for medications and monitor your lab values.    On the day of Discharge:   VITAL SIGNS:  Blood pressure 122/66, pulse 78, temperature 97.5 F (36.4 C), temperature source Oral, resp. rate 15, height 5\' 5"  (1.651 m), weight 201 lb 9.6 oz (91.4 kg), SpO2 96 %.  I/O:   Intake/Output Summary (Last 24 hours) at 06/21/16 1116 Last data filed at 06/21/16 0939  Gross per 24 hour  Intake              480 ml  Output             1000 ml  Net             -520 ml    PHYSICAL EXAMINATION:  GENERAL:  82 y.o.-year-old patient lying in the bed with no acute distress.  EYES: Pupils equal, round, reactive to light and accommodation. No scleral icterus. Extraocular muscles intact.  HEENT: Head atraumatic, normocephalic. Oropharynx and nasopharynx clear.  NECK:  Supple, no jugular venous distention. No thyroid enlargement, no tenderness.  LUNGS: Normal breath sounds bilaterally, no wheezing, rales,rhonchi or crepitation. No use of accessory muscles of respiration.  CARDIOVASCULAR: S1, S2 normal. 3/6 systolic ejection murmur, rubs, or gallops.  ABDOMEN: Soft, non-tender, non-distended. Bowel sounds present. No organomegaly or mass.  EXTREMITIES: Trace pedal edema, cyanosis, or clubbing.  NEUROLOGIC: Cranial nerves II through XII are intact. Muscle strength 5/5 in all extremities. Sensation intact. Gait not checked.  PSYCHIATRIC: The patient is alert and oriented x 3.  SKIN: No obvious rash, lesion, or ulcer.   DATA REVIEW:   CBC  Recent Labs Lab 06/21/16 0203    WBC 9.7  HGB 9.7*  HCT 29.2*  PLT 202    Chemistries   Recent Labs Lab 06/20/16 1207 06/21/16 0203  NA 136 135  K 3.9 3.9  CL 98* 97*  CO2 30 30  GLUCOSE 94 119*  BUN 20 32*  CREATININE 1.18* 1.81*  CALCIUM 9.2 8.6*  AST 22  --   ALT 20  --   ALKPHOS 51  --   BILITOT 1.0  --     Cardiac Enzymes  Recent Labs Lab 06/21/16 0203  TROPONINI <0.03    Microbiology Results  Results for orders placed or performed in visit on 10/07/13  Urine culture     Status: None  Collection Time: 10/07/13 12:23 PM  Result Value Ref Range Status   Micro Text Report   Final       SOURCE: CLEAN CATCH    ORGANISM 1                >100,000 CFU/ML ESCHERICHIA COLI   ANTIBIOTIC                    ORG#1     AMPICILLIN                    S         CEFAZOLIN                     S         CEFOXITIN                     S         CEFTRIAXONE                   S         CIPROFLOXACIN                 S         GENTAMICIN                    S         IMIPENEM                      S         LEVOFLOXACIN                  S         NITROFURANTOIN                S         TRIMETHOPRIM/SULFAMETHOXAZOLE S             RADIOLOGY:  Dg Chest 2 View  Result Date: 06/20/2016 CLINICAL DATA:  Intermittent mid sternal chest pain with shortness of breath for week especially when walking around the house, history CHF, hypertension, GERD EXAM: CHEST  2 VIEW COMPARISON:  10/13/2015 FINDINGS: Enlargement of cardiac silhouette. Atherosclerotic calcification aorta. Slight prominent central pulmonary arteries question pulmonary arterial hypertension. Bronchitic changes without infiltrate, pleural effusion or pneumothorax. Bones demineralized with evidence of prior lumbar spine surgery. IMPRESSION: Enlargement of cardiac silhouette with question pulmonary arterial hypertension. Aortic atherosclerosis. Electronically Signed   By: Ulyses SouthwardMark  Boles M.D.   On: 06/20/2016 11:40     Management plans discussed with the  patient, family and they are in agreement.  CODE STATUS:     Code Status Orders        Start     Ordered   06/20/16 1421  Full code  Continuous     06/20/16 1421    Code Status History    Date Active Date Inactive Code Status Order ID Comments User Context   06/20/2016  2:21 PM 06/20/2016  5:22 PM Full Code [id number]  Auburn BilberryShreyang Patel, MD ED   10/13/2015  6:55 PM 10/15/2015  3:14 PM Full Code [id number]  Ramonita LabAruna Gouru, MD Inpatient      TOTAL TIME TAKING CARE OF THIS PATIENT: 40 minutes.    Hower,  Mardi MainlandDavid K M.D on 06/21/2016 at 11:16 AM  Between 7am to 6pm - Pager - [phone]  After 6pm  go to [email or web address] - Social research officer, government  [employer] [place] [facility]  Office  [phone]  CC: Primary care physician; Leanna Sato, MD

## 2016-06-21 NOTE — Care Management (Signed)
No discharge needs. 

## 2016-06-21 NOTE — Progress Notes (Signed)
Pharmacy Note - Anticoagulation  Patient with orders for enoxaparin 40mg  SQ Q24H for VTE prophylaxis  Estimated Creatinine Clearance: 25.9 mL/min (by C-G formula based on SCr of 1.81 mg/dL).  Changed to enoxaparin 30mg  SQ Q24H per protocol for CrCl < 5730ml/min  Garlon HatchetJody Kordelia Severin, PharmD Clinical Pharmacist  06/21/2016 10:20 AM

## 2016-06-21 NOTE — Discharge Instructions (Signed)
Nonspecific Chest Pain It is often hard to find the cause of chest pain. There is always a chance that your pain could be related to something serious, such as a heart attack or a blood clot in your lungs. Chest pain can also be caused by conditions that are not life-threatening. If you have chest pain, it is very important to follow up with your doctor.  HOME CARE  If you were prescribed an antibiotic medicine, finish it all even if you start to feel better.  Avoid any activities that cause chest pain.  Do not use any tobacco products, including cigarettes, chewing tobacco, or electronic cigarettes. If you need help quitting, ask your doctor.  Do not drink alcohol.  Take medicines only as told by your doctor.  Keep all follow-up visits as told by your doctor. This is important. This includes any further testing if your chest pain does not go away.  Your doctor may tell you to keep your head raised (elevated) while you sleep.  Make lifestyle changes as told by your doctor. These may include:  Getting regular exercise. Ask your doctor to suggest some activities that are safe for you.  Eating a heart-healthy diet. Your doctor or a diet specialist (dietitian) can help you to learn healthy eating options.  Maintaining a healthy weight.  Managing diabetes, if necessary.  Reducing stress. GET HELP IF:  Your chest pain does not go away, even after treatment.  You have a rash with blisters on your chest.  You have a fever. GET HELP RIGHT AWAY IF:  Your chest pain is worse.  You have an increasing cough, or you cough up blood.  You have severe belly (abdominal) pain.  You feel extremely weak.  You pass out (faint).  You have chills.  You have sudden, unexplained chest discomfort.  You have sudden, unexplained discomfort in your arms, back, neck, or jaw.  You have shortness of breath at any time.  You suddenly start to sweat, or your skin gets clammy.  You feel  nauseous.  You vomit.  You suddenly feel light-headed or dizzy.  Your heart begins to beat quickly, or it feels like it is skipping beats. These symptoms may be an emergency. Do not wait to see if the symptoms will go away. Get medical help right away. Call your local emergency services (911 in the [place] Do not drive yourself to the hospital.   This information is not intended to replace advice given to you by your health care provider. Make sure you discuss any questions you have with your health care provider.   Document Released: 04/18/2008 Document Revised: 11/21/2014 Document Reviewed: 06/06/2014 Elsevier Interactive Patient Education 2016 [employer] Heart Failure Clinic appointment on July 07, 2016 at 11:00am with Clarisa Kindredina Hackney, FNP. Please call [phone] to reschedule.

## 2016-06-21 NOTE — Progress Notes (Signed)
Initial Heart Failure Clinic appointment scheduled for July 07, 2016 at 11:00am. Thank you for the referral.

## 2016-06-21 NOTE — Progress Notes (Signed)
[facility] Cardiology [facility] Encounter Note  Patient: Rose [name] / Admit Date: 06/20/2016 / Date of Encounter: 06/21/2016, 8:28 AM   Subjective: Patient is ambulating with less shortness of breath. No evidence of chest pain. Lower extremity edema improved. Patient does have significantly worsening chronic kidney disease possibly due to significant diuresis  Review of Systems: Positive for: Shortness of breath weakness Negative for: Vision change, hearing change, syncope, dizziness, nausea, vomiting,diarrhea, bloody stool, stomach pain, cough, congestion, diaphoresis, urinary frequency, urinary pain,skin lesions, skin rashes Others previously listed  Objective: Telemetry: Sinus rhythm Physical Exam: Blood pressure 122/66, pulse 78, temperature 97.5 F (36.4 C), temperature source Oral, resp. rate 15, height  (1.651 m), weight 201 lb 9.6 oz (91.4 kg), SpO2 96 %. Body mass index is 33.55 kg/m. General: Well developed, well nourished, in no acute distress. Head: Normocephalic, atraumatic, sclera non-icteric, no xanthomas, nares are without discharge. Neck: No apparent masses Lungs: Normal respirations with no wheezes, no rhonchi, no rales , no crackles   Heart: Regular rate and rhythm, normal S1 soft S2, 3 out of 6 right upper sternal murmur, no rub, no gallop, PMI is normal size and placement, carotid upstroke normal without bruit, jugular venous pressure normal Abdomen: Soft, non-tender, non-distended with normoactive bowel sounds. No hepatosplenomegaly. Abdominal aorta is normal size without bruit Extremities: Trace to 1+ edema, no clubbing, no cyanosis, no ulcers,  Peripheral: 2+ radial, 2+ femoral, 2+ dorsal pedal pulses Neuro: Alert and oriented. Moves all extremities spontaneously. Psych:  Responds to questions appropriately with a normal affect.   Intake/Output Summary (Last 24 hours) at 06/21/16 0828 Last data filed at 06/20/16 1800  Gross per 24 hour  Intake               240 ml  Output             1000 ml  Net             -760 ml    Inpatient Medications:  . aspirin EC  81 mg Oral Daily  . enoxaparin (LOVENOX) injection  40 mg Subcutaneous Q24H  . furosemide  20 mg Intravenous Q12H  . hydrALAZINE  10 mg Oral BID  . metoprolol  50 mg Oral BID  . multivitamin with minerals  1 tablet Oral Daily  . pantoprazole  40 mg Oral Daily  . prazosin  1 mg Oral BID  . ranolazine  500 mg Oral BID  . sodium chloride flush  3 mL Intravenous Q12H   Infusions:    Labs:  Recent Labs  06/20/16 1207 06/21/16 0203  NA 136 135  K 3.9 3.9  CL 98* 97*  CO2 30 30  GLUCOSE 94 119*  BUN 20 32*  CREATININE 1.18* 1.81*  CALCIUM 9.2 8.6*    Recent Labs  06/20/16 1207  AST 22  ALT 20  ALKPHOS 51  BILITOT 1.0  PROT 7.2  ALBUMIN 4.0    Recent Labs  06/20/16 1207 06/21/16 0203  WBC 9.6 9.7  NEUTROABS 7.7*  --   HGB 11.1* 9.7*  HCT 33.3* 29.2*  MCV 84.6 84.6  PLT 216 202    Recent Labs  06/20/16 1207 06/20/16 1620 06/20/16 2221 06/21/16 0203  TROPONINI <0.03 0.04* <0.03 <0.03   Invalid input(s): POCBNP No results for input(s): HGBA1C in the last 72 hours.   Weights: Filed Weights   06/20/16 1017 06/20/16 1540  Weight: 199 lb (90.3 kg) 201 lb 9.6 oz (91.4 kg)  Radiology/Studies:  Dg Chest 2 View  Result Date: 06/20/2016 CLINICAL DATA:  Intermittent mid sternal chest pain with shortness of breath for week especially when walking around the house, history CHF, hypertension, GERD EXAM: CHEST  2 VIEW COMPARISON:  10/13/2015 FINDINGS: Enlargement of cardiac silhouette. Atherosclerotic calcification aorta. Slight prominent central pulmonary arteries question pulmonary arterial hypertension. Bronchitic changes without infiltrate, pleural effusion or pneumothorax. Bones demineralized with evidence of prior lumbar spine surgery. IMPRESSION: Enlargement of cardiac silhouette with question pulmonary arterial hypertension. Aortic atherosclerosis.  Electronically Signed   By: Ulyses SouthwardMark  Boles M.D.   On: 06/20/2016 11:40     Assessment and Recommendation  82 y.o. female with known coronary artery disease with acute on chronic diastolic dysfunction congestive heart failure multifactorial in nature with possible chronic kidney disease and dietary issues without current evidence of myocardial infarction 1. Continue metoprolol hydralazine and isosorbide for diastolic dysfunction cardiomyopathy and hypertension control 2. Decrease diuretics due to recent worsening of chronic kidney disease 3. No further cardiac diagnostics necessary at this time 4. Begin ambulation following for any further significant adjustments of medication management and possible discharged home if feeling well with close follow-up later this week   Signed, Arnoldo HookerBruce Kowalski M.D. FACC

## 2016-07-07 ENCOUNTER — Ambulatory Visit: Payer: Medicare HMO | Admitting: Family

## 2016-07-27 DIAGNOSIS — Z9181 History of falling: Secondary | ICD-10-CM | POA: Insufficient documentation

## 2016-07-27 DIAGNOSIS — M545 Low back pain, unspecified: Secondary | ICD-10-CM | POA: Insufficient documentation

## 2016-08-03 ENCOUNTER — Telehealth: Payer: Self-pay | Admitting: General Surgery

## 2016-08-03 NOTE — Telephone Encounter (Signed)
Received referral from [facility] on patient for gallstones. I have attempted to call patient a couple times on 9/19 and again on 9/20. There is no answer and no voicemail to leave a message. Will attempt to call again.

## 2016-08-11 NOTE — Telephone Encounter (Signed)
Called and spoke to patient regarding a referral from [facility] for gallstones. Patient stated she didn't want to come in at this time but will call us if needed.

## 2016-08-16 ENCOUNTER — Other Ambulatory Visit: Payer: Self-pay

## 2016-08-17 ENCOUNTER — Other Ambulatory Visit
Admission: RE | Admit: 2016-08-17 | Discharge: 2016-08-17 | Disposition: A | Discharge status: Home / Self Care | Payer: Medicare HMO | Source: Ambulatory Visit | Attending: Surgery | Admitting: Surgery

## 2016-08-17 ENCOUNTER — Encounter: Payer: Self-pay | Admitting: Surgery

## 2016-08-17 ENCOUNTER — Ambulatory Visit (INDEPENDENT_AMBULATORY_CARE_PROVIDER_SITE_OTHER): Payer: 59 | Admitting: Surgery

## 2016-08-17 VITALS — BP 140/67 | HR 86 | Temp 98.4°F | Ht 64.0 in | Wt 198.0 lb

## 2016-08-17 DIAGNOSIS — G8929 Other chronic pain: Secondary | ICD-10-CM | POA: Diagnosis not present

## 2016-08-17 DIAGNOSIS — G459 Transient cerebral ischemic attack, unspecified: Secondary | ICD-10-CM | POA: Insufficient documentation

## 2016-08-17 DIAGNOSIS — R1031 Right lower quadrant pain: Secondary | ICD-10-CM

## 2016-08-17 DIAGNOSIS — M1990 Unspecified osteoarthritis, unspecified site: Secondary | ICD-10-CM | POA: Insufficient documentation

## 2016-08-17 DIAGNOSIS — R1011 Right upper quadrant pain: Secondary | ICD-10-CM | POA: Insufficient documentation

## 2016-08-17 DIAGNOSIS — G43019 Migraine without aura, intractable, without status migrainosus: Secondary | ICD-10-CM | POA: Insufficient documentation

## 2016-08-17 DIAGNOSIS — K219 Gastro-esophageal reflux disease without esophagitis: Secondary | ICD-10-CM | POA: Insufficient documentation

## 2016-08-17 DIAGNOSIS — G4733 Obstructive sleep apnea (adult) (pediatric): Secondary | ICD-10-CM | POA: Insufficient documentation

## 2016-08-17 DIAGNOSIS — Z8679 Personal history of other diseases of the circulatory system: Secondary | ICD-10-CM | POA: Insufficient documentation

## 2016-08-17 DIAGNOSIS — R51 Headache: Secondary | ICD-10-CM

## 2016-08-17 DIAGNOSIS — R519 Headache, unspecified: Secondary | ICD-10-CM | POA: Insufficient documentation

## 2016-08-17 LAB — CBC WITH DIFFERENTIAL/PLATELET
BASOS ABS: 0 10*3/uL (ref 0–0.1)
BASOS PCT: 0 %
EOS ABS: 0.1 10*3/uL (ref 0–0.7)
EOS PCT: 1 %
HEMATOCRIT: 32.5 % — AB (ref 35.0–47.0)
Hemoglobin: 10.5 g/dL — ABNORMAL LOW (ref 12.0–16.0)
Lymphocytes Relative: 14 %
Lymphs Abs: 1.1 10*3/uL (ref 1.0–3.6)
MCH: 27.5 pg (ref 26.0–34.0)
MCHC: 32.4 g/dL (ref 32.0–36.0)
MCV: 84.9 fL (ref 80.0–100.0)
MONO ABS: 0.4 10*3/uL (ref 0.2–0.9)
MONOS PCT: 5 %
NEUTROS ABS: 6.1 10*3/uL (ref 1.4–6.5)
Neutrophils Relative %: 80 %
PLATELETS: 232 10*3/uL (ref 150–440)
RBC: 3.83 MIL/uL (ref 3.80–5.20)
RDW: 14.9 % — AB (ref 11.5–14.5)
WBC: 7.7 10*3/uL (ref 3.6–11.0)

## 2016-08-17 LAB — COMPREHENSIVE METABOLIC PANEL
ALBUMIN: 4 g/dL (ref 3.5–5.0)
ALT: 14 U/L (ref 14–54)
ANION GAP: 8 (ref 5–15)
AST: 18 U/L (ref 15–41)
Alkaline Phosphatase: 51 U/L (ref 38–126)
BILIRUBIN TOTAL: 0.6 mg/dL (ref 0.3–1.2)
BUN: 24 mg/dL — AB (ref 6–20)
CHLORIDE: 100 mmol/L — AB (ref 101–111)
CO2: 29 mmol/L (ref 22–32)
Calcium: 9.1 mg/dL (ref 8.9–10.3)
Creatinine, Ser: 1.54 mg/dL — ABNORMAL HIGH (ref 0.44–1.00)
GFR calc Af Amer: 35 mL/min — ABNORMAL LOW (ref >60)
GFR calc non Af Amer: 30 mL/min — ABNORMAL LOW (ref >60)
GLUCOSE: 98 mg/dL (ref 65–99)
POTASSIUM: 4.4 mmol/L (ref 3.5–5.1)
SODIUM: 137 mmol/L (ref 135–145)
TOTAL PROTEIN: 7.2 g/dL (ref 6.5–8.1)

## 2016-08-17 NOTE — Patient Instructions (Signed)
Your appointment for your [facility] at 9:00 AM but to arrive at 8:45 AM. Please remember to be fasting before your ultrasound.  We will see you back in two weeks.

## 2016-08-17 NOTE — Progress Notes (Signed)
Surgical Consultation  08/17/2016  Rose [name] is an 82 y.o. female.   CC: Lower abdominal pain  HPI: This patient with over 1 year of abdominal pain she relates it to a change in medication year ago when asked to point to the area where it hurts her the most she points to her bilateral flanks right greater than left. She has chest pain when she walks but does not point to the right upper quadrant. He is had no nausea or vomiting and this is not related to fatty food intake. He is frequently constipated  He has significant medical problems most importantly congestive heart failure requiring admission to the hospital for diuresis. He thinks her legs are not nearly as swollen as they have been in the past. She does have routine chest pain when exerting. She is in a wheelchair.  Past Medical History:  Diagnosis Date  . Allergic rhinitis   . Anemia   . Arrhythmia    s/p ablation for A-fib  . Carpal tunnel syndrome   . GERD (gastroesophageal reflux disease)   . Heart murmur   . Hyperlipidemia   . Hypertension   . Lumbar spine pain   . Nephrolithiasis   . Osteoarthritis   . Sleep apnea   . TIA (transient ischemic attack)     Past Surgical History:  Procedure Laterality Date  . ABDOMINAL HYSTERECTOMY    . ablation  January 2011   A-fib  . CARDIAC CATHETERIZATION    . ESOPHAGOGASTRODUODENOSCOPY N/A 05/22/2015   Procedure: ESOPHAGOGASTRODUODENOSCOPY (EGD);  Surgeon: Scot Jun, MD;  Location[facility] ENDOSCOPY;  Service: Endoscopy;  Laterality: N/A;  . RENAL ARTERY STENT    . REPLACEMENT TOTAL KNEE BILATERAL    . SAVORY DILATION N/A 05/22/2015   Procedure: SAVORY DILATION;  Surgeon: Scot Jun, MD;  Location: [facility] ENDOSCOPY;  Service: Endoscopy;  Laterality: N/A;  . TONSILLECTOMY      Family History  Problem Relation Age of Onset  . Diabetes Father   . Hypertension Mother     Social History:  reports that she has never smoked. She has never used smokeless tobacco.  She reports that she does not drink alcohol. Her drug history is not on file.  Allergies:  Allergies  Allergen Reactions  . Celecoxib Other (See Comments)    Unknown.  . Losartan Other (See Comments)    Unknown  . Avapro [Irbesartan] Other (See Comments)  . Clopidogrel Other (See Comments)  . Lisinopril Other (See Comments)  . Plavix [Clopidogrel Bisulfate]   . Pravachol   . Pravastatin Other (See Comments)  . Rosuvastatin   . Statins   . Tramadol Other (See Comments)  . Ultram [Tramadol Hcl]     Medications reviewed.   Review of Systems:   Review of Systems  Constitutional: Negative for chills, fever and weight loss.  HENT: Negative.   Eyes: Negative.   Respiratory: Positive for shortness of breath and wheezing. Negative for cough, hemoptysis and sputum production.   Cardiovascular: Positive for chest pain, orthopnea, claudication and leg swelling. Negative for palpitations.  Gastrointestinal: Positive for abdominal pain and constipation. Negative for blood in stool, diarrhea, heartburn, melena, nausea and vomiting.  Genitourinary: Negative.   Musculoskeletal: Negative.   Skin: Negative.   Neurological: Negative.   Endo/Heme/Allergies: Negative.   Psychiatric/Behavioral: Negative.      Physical Exam:  BP 140/67   Pulse 86   Temp 98.4 F (36.9 C) (Oral)   Ht 5\' 4"  (1.626 m)  Wt 198 lb (89.8 kg)   BMI 33.99 kg/m   Physical Exam  Constitutional: She is oriented to person, place, and time and well-developed, well-nourished, and in no distress. No distress.  Patient and wheelchair BMI is 34 No acute distress  HENT:  Head: Normocephalic and atraumatic.  Eyes: Pupils are equal, round, and reactive to light. Right eye exhibits no discharge. Left eye exhibits no discharge. No scleral icterus.  Neck: Normal range of motion.  Cardiovascular: Normal rate, regular rhythm and normal heart sounds.   Pulmonary/Chest: Effort normal and breath sounds normal. No  respiratory distress. She has no wheezes. She has no rales.  Abdominal: Soft. She exhibits no distension. There is no tenderness. There is no rebound and no guarding.  Musculoskeletal: Normal range of motion. She exhibits edema. She exhibits no tenderness.  Lymphadenopathy:    She has no cervical adenopathy.  Neurological: She is alert and oriented to person, place, and time.  Skin: Skin is warm and dry. No rash noted. She is not diaphoretic. No erythema.  Psychiatric: Mood and affect normal.  Vitals reviewed.     No results found for this or any previous visit (from the past 48 hour(s)). No results found.  Assessment/Plan:  This patient has had no recent laboratory values for liver function tests purposes nor has she had any sort of imaging recently to suggest gallstones as etiology of her disease she was sent over by [facility]. In reviewing her chart she had a CT scan performed last year that showed no gallstones. Her symptoms are not consistent with biliary colic and they have been long-standing and may be related to constipation alone Her significant cardiac history and cardiac disease suggests that she may not be a very good surgical candidate regardless but I will obtain an ultrasound of her gallbladder to confirm that no gallstones are present and check her liver function tests and see her back in 2 weeks. Washings were answered for her  Lattie Hawichard E Verl Whitmore, MD, FACS

## 2016-08-17 NOTE — Addendum Note (Signed)
Addended by: Adela PortsBONICHE, Anju Sereno on: 08/17/2016 10:52 AM   Modules accepted: Orders

## 2016-08-22 ENCOUNTER — Ambulatory Visit: Payer: Medicare HMO

## 2016-09-07 ENCOUNTER — Ambulatory Visit: Payer: Medicare HMO | Admitting: Family

## 2016-09-07 ENCOUNTER — Ambulatory Visit: Payer: Self-pay | Admitting: Surgery

## 2016-09-19 ENCOUNTER — Ambulatory Visit
Admission: RE | Admit: 2016-09-19 | Discharge: 2016-09-19 | Disposition: A | Discharge status: Home / Self Care | Payer: Medicare HMO | Source: Ambulatory Visit | Attending: Surgery | Admitting: Surgery

## 2016-09-19 DIAGNOSIS — G8929 Other chronic pain: Secondary | ICD-10-CM | POA: Insufficient documentation

## 2016-09-19 DIAGNOSIS — K8020 Calculus of gallbladder without cholecystitis without obstruction: Secondary | ICD-10-CM | POA: Insufficient documentation

## 2016-09-19 DIAGNOSIS — R1031 Right lower quadrant pain: Secondary | ICD-10-CM | POA: Diagnosis present

## 2016-09-21 ENCOUNTER — Encounter: Payer: Self-pay | Admitting: Surgery

## 2016-09-21 ENCOUNTER — Other Ambulatory Visit: Payer: Self-pay

## 2016-09-21 ENCOUNTER — Ambulatory Visit (INDEPENDENT_AMBULATORY_CARE_PROVIDER_SITE_OTHER): Payer: 59 | Admitting: Surgery

## 2016-09-21 VITALS — BP 160/80 | HR 89 | Temp 98.2°F | Ht 65.0 in | Wt 200.0 lb

## 2016-09-21 DIAGNOSIS — G8929 Other chronic pain: Secondary | ICD-10-CM | POA: Diagnosis not present

## 2016-09-21 DIAGNOSIS — K8020 Calculus of gallbladder without cholecystitis without obstruction: Secondary | ICD-10-CM

## 2016-09-21 DIAGNOSIS — R1031 Right lower quadrant pain: Secondary | ICD-10-CM

## 2016-09-21 NOTE — Patient Instructions (Signed)
Please call our office if you have any questions or concerns.  

## 2016-09-21 NOTE — Progress Notes (Signed)
Outpatient Surgical Follow Up  09/21/2016  Rose [name] is an 82 y.o. female.   ZO:[id number]:gallstones  HPI: Patient describes recurrent and episodic bilateral flank pain right slightly greater than left no right upper quadrant pain no epigastric pain occasional nausea and no emesis and occasional lower quadrant pain as well these episodes are not related to fatty food intolerance and last approximately 30 minutes. They have not affected her lifestyle nor has she had to require any pain medication for these episodes. She denies fevers or chills. She has a known heart murmur and significant CHF  Past Medical History:  Diagnosis Date  . Allergic rhinitis   . Anemia   . Arrhythmia    s/p ablation for A-fib  . Carpal tunnel syndrome   . GERD (gastroesophageal reflux disease)   . Heart murmur   . Hyperlipidemia   . Hypertension   . Lumbar spine pain   . Nephrolithiasis   . Osteoarthritis   . Sleep apnea   . TIA (transient ischemic attack)     Past Surgical History:  Procedure Laterality Date  . ABDOMINAL HYSTERECTOMY    . ablation  January 2011   A-fib  . CARDIAC CATHETERIZATION    . ESOPHAGOGASTRODUODENOSCOPY N/A 05/22/2015   Procedure: ESOPHAGOGASTRODUODENOSCOPY (EGD);  Surgeon: Scot Junobert T Elliott, MD;  [facility] ENDOSCOPY;  Service: Endoscopy;  Laterality: N/A;  . RENAL ARTERY STENT    . REPLACEMENT TOTAL KNEE BILATERAL    . SAVORY DILATION N/A 05/22/2015   Procedure: SAVORY DILATION;  Surgeon: Scot Junobert T Elliott, MD;  [facility]COPY;  Service: Endoscopy;  Laterality: N/A;  . TONSILLECTOMY      Family History  Problem Relation Age of Onset  . Diabetes Father   . Hypertension Mother     Social History:  reports that she has never smoked. She has never used smokeless tobacco. She reports that she does not drink alcohol. Her drug history is not on file.  Allergies:  Allergies  Allergen Reactions  . Celecoxib Other (See Comments)    Unknown.  . Losartan Other (See  Comments)    Unknown  . Avapro [Irbesartan] Other (See Comments)  . Clopidogrel Other (See Comments)  . Lisinopril Other (See Comments)  . Plavix [Clopidogrel Bisulfate]   . Pravachol   . Pravastatin Other (See Comments)  . Rosuvastatin   . Statins   . Tramadol Other (See Comments)  . Ultram [Tramadol Hcl]     Medications reviewed.   Review of Systems:   Review of Systems  Constitutional: Negative for chills, fever and weight loss.  HENT: Negative.   Eyes: Negative.   Respiratory: Positive for shortness of breath. Negative for cough, hemoptysis and sputum production.   Cardiovascular: Positive for chest pain, claudication and leg swelling. Negative for palpitations and orthopnea.  Gastrointestinal: Positive for heartburn and nausea. Negative for abdominal pain, blood in stool, constipation, diarrhea and vomiting.  Genitourinary: Negative.   Musculoskeletal: Negative.   Skin: Negative.   Neurological: Negative.   Endo/Heme/Allergies: Negative.   Psychiatric/Behavioral: Negative.      Physical Exam:  There were no vitals taken for this visit.  Physical Exam  Constitutional: She is oriented to person, place, and time and well-developed, well-nourished, and in no distress. No distress.  Patient arrives in a wheelchair prefers to sit in the wheelchair for the exam  HENT:  Head: Normocephalic and atraumatic.  Eyes: Pupils are equal, round, and reactive to light. Right eye exhibits no discharge. Left eye exhibits no  discharge. No scleral icterus.  Neck: Normal range of motion.  Cardiovascular: Normal rate and regular rhythm.   Murmur heard. Holosystolic murmur  Pulmonary/Chest: Effort normal. No respiratory distress. She has no wheezes. She has rales.  Abdominal: Soft. She exhibits no distension. There is no tenderness. There is no rebound and no guarding.  Musculoskeletal: Normal range of motion. She exhibits edema. She exhibits no tenderness.  Lymphadenopathy:    She  has no cervical adenopathy.  Neurological: She is alert and oriented to person, place, and time.  Skin: Skin is warm and dry. No rash noted. She is not diaphoretic. No erythema.  Psychiatric: Mood and affect normal.  Vitals reviewed.     No results found for this or any previous visit (from the past 48 hour(s)). [place] Abdomen Limited Ruq  Result Date: 09/19/2016 CLINICAL DATA:  Right upper quadrant pain 1 year. EXAM: US ABDOMEN LIMITED - RIGHT UPPER QUADRANT COMPARISON:  CT 02/11/2015 FINDINGS: Gallbladder: Moderate cholelithiasis with the largest stone measuring 8 mm. No significant wall thickening and negative sonographic Murphy sign. No adjacent free fluid. Common bile duct: Diameter: 8.3 mm which is upper limits of normal. Liver: No focal lesion identified. Within normal limits in parenchymal echogenicity. IMPRESSION: Moderate cholelithiasis without additional sonographic evidence to suggest cholecystitis. Electronically Signed   By: Elberta Fortisaniel  Boyle M.D.   On: 09/19/2016 09:47    Assessment/Plan:  This a patient with recurrent and episodic bilateral flank pain and lower quadrant pain. She's not had any pain or tenderness in the area of the right upper quadrant to suggest a Murphy sign. On my exams twice she has not had a positive Murphy sign. She is completely nontender in this area.  Her ultrasound suggests gallstones but her labs are all normal.  While this may be some form of biliary colic it is hard to explain the left upper quadrant and bilateral lower quadrant pain and the short duration of her symptoms suggest that surgical intervention for gallstones may not be the best choice especially with her significant holosystolic heart murmur CHF etc.  Patient and family were in agreement with this. I offered to reevaluate at any time should her symptoms become more classic or severe and surgery become more indicated. Patient knows to follow-up with us on an as-needed basis.  Lattie Hawichard E Zainab Crumrine,  MD, FACS

## 2016-09-23 ENCOUNTER — Ambulatory Visit: Payer: Medicare HMO | Attending: Family | Admitting: Family

## 2016-09-23 ENCOUNTER — Encounter: Payer: Self-pay | Admitting: Family

## 2016-09-23 VITALS — BP 155/60 | HR 85 | Resp 18 | Ht 64.0 in | Wt 200.0 lb

## 2016-09-23 DIAGNOSIS — N289 Disorder of kidney and ureter, unspecified: Secondary | ICD-10-CM | POA: Insufficient documentation

## 2016-09-23 DIAGNOSIS — Z8673 Personal history of transient ischemic attack (TIA), and cerebral infarction without residual deficits: Secondary | ICD-10-CM | POA: Insufficient documentation

## 2016-09-23 DIAGNOSIS — I5032 Chronic diastolic (congestive) heart failure: Secondary | ICD-10-CM | POA: Diagnosis present

## 2016-09-23 DIAGNOSIS — I110 Hypertensive heart disease with heart failure: Secondary | ICD-10-CM | POA: Diagnosis not present

## 2016-09-23 DIAGNOSIS — Z79899 Other long term (current) drug therapy: Secondary | ICD-10-CM | POA: Diagnosis not present

## 2016-09-23 DIAGNOSIS — Z7982 Long term (current) use of aspirin: Secondary | ICD-10-CM | POA: Insufficient documentation

## 2016-09-23 DIAGNOSIS — E785 Hyperlipidemia, unspecified: Secondary | ICD-10-CM | POA: Diagnosis not present

## 2016-09-23 DIAGNOSIS — R5383 Other fatigue: Secondary | ICD-10-CM | POA: Insufficient documentation

## 2016-09-23 DIAGNOSIS — R0602 Shortness of breath: Secondary | ICD-10-CM | POA: Insufficient documentation

## 2016-09-23 DIAGNOSIS — K219 Gastro-esophageal reflux disease without esophagitis: Secondary | ICD-10-CM | POA: Diagnosis not present

## 2016-09-23 DIAGNOSIS — D649 Anemia, unspecified: Secondary | ICD-10-CM | POA: Insufficient documentation

## 2016-09-23 DIAGNOSIS — G4733 Obstructive sleep apnea (adult) (pediatric): Secondary | ICD-10-CM | POA: Diagnosis not present

## 2016-09-23 DIAGNOSIS — I10 Essential (primary) hypertension: Secondary | ICD-10-CM

## 2016-09-23 MED ORDER — METOPROLOL SUCCINATE ER 50 MG PO TB24: 50.0000 mg | ORAL_TABLET | Freq: Every day | ORAL | 2 refills | Status: DC

## 2016-09-23 NOTE — Patient Instructions (Signed)
Continue weighing daily and call for an overnight weight gain of > 2 pounds or a weekly weight gain of >5 pounds. 

## 2016-09-23 NOTE — Progress Notes (Signed)
Patient ID: Rose [name], female    DOB: October 22, 1930, 82 y.o.   MRN: [id number]  HPI  Rose [name] is a 82 y/o female with a history of hyperlipidemia, murmur, HTN, renal insufficiency, GERD, obstructive sleep apnea, TIA, anemia, arrhythmia s/p ablation and chronic heart failure.  Last echo was done on 06/20/16 and showed an EF of 65-70% with mild/moderate MR, mild TR and mild/moderate aortic stenosis. This is essentially unchanged from her previous echo done on 10/14/15.  Last admitted on 06/20/16 with chest pain and acute on chronic heart failure exacerbation. She was treated with IV diuretics and had a cardiology consult done. She was discharged the following day.  She presents today for her initial visit with fatigue and shortness of breath upon exertion. Denies any swelling in her legs. Is already weighing herself daily. Does add "a little" salt to her food. Notices wheezing only when she feels short of breath. Was on metoprolol but she says that she stopped it because she didn't like the way it made her feel. Does have multiple allergies to medications.   Past Medical History:  Diagnosis Date  . Allergic rhinitis   . Anemia   . Arrhythmia    s/p ablation for A-fib  . Carpal tunnel syndrome   . GERD (gastroesophageal reflux disease)   . Heart murmur   . Hyperlipidemia   . Hypertension   . Lumbar spine pain   . Nephrolithiasis   . Osteoarthritis   . Sleep apnea   . TIA (transient ischemic attack)     Past Surgical History:  Procedure Laterality Date  . ABDOMINAL HYSTERECTOMY    . ablation  January 2011   A-fib  . CARDIAC CATHETERIZATION    . ESOPHAGOGASTRODUODENOSCOPY N/A 05/22/2015   Procedure: ESOPHAGOGASTRODUODENOSCOPY (EGD);  Surgeon: Scot Jun, MD;  Location: [facility] ENDOSCOPY;  Service: Endoscopy;  Laterality: N/A;  . RENAL ARTERY STENT    . REPLACEMENT TOTAL KNEE BILATERAL    . SAVORY DILATION N/A 05/22/2015   Procedure: SAVORY DILATION;  Surgeon: Scot Jun, MD;   Location: [facility] ENDOSCOPY;  Service: Endoscopy;  Laterality: N/A;  . TONSILLECTOMY      Family History  Problem Relation Age of Onset  . Diabetes Father   . Hypertension Mother     Social History  Substance Use Topics  . Smoking status: Never Smoker  . Smokeless tobacco: Never Used  . Alcohol use No    Allergies  Allergen Reactions  . Celecoxib Other (See Comments)    Unknown.  . Losartan Other (See Comments)    Unknown  . Avapro [Irbesartan] Other (See Comments)  . Clopidogrel Other (See Comments)  . Lisinopril Other (See Comments)  . Plavix [Clopidogrel Bisulfate]   . Pravachol   . Pravastatin Other (See Comments)  . Rosuvastatin   . Statins   . Tramadol Other (See Comments)  . Ultram [Tramadol Hcl]     Prior to Admission medications   Medication Sig Start Date End Date Taking? Authorizing Provider  albuterol (PROVENTIL HFA;VENTOLIN HFA) 108 (90 BASE) MCG/ACT inhaler Inhale 2 puffs into the lungs every 6 (six) hours as needed for wheezing or shortness of breath.   Yes Historical Provider, MD  aspirin EC 81 MG tablet Take 81 mg by mouth daily.   Yes Historical Provider, MD  Cholecalciferol (VITAMIN D3) 1000 units CAPS Take by mouth.   Yes Historical Provider, MD  docusate sodium (COLACE) 100 MG capsule Take 100 mg by mouth 2 (two) times  daily.   Yes Historical Provider, MD  furosemide (LASIX) 40 MG tablet Take 1 tablet (40 mg total) by mouth daily. 10/15/15  Yes Alford Highlandichard Wieting, MD  Garlic 1000 MG CAPS Take 1,000 mg by mouth 1 day or 1 dose.   Yes Historical Provider, MD  hydrALAZINE (APRESOLINE) 10 MG tablet Take 1 tablet (10 mg total) by mouth 2 (two) times daily. 10/15/15  Yes Alford Highlandichard Wieting, MD  Multiple Vitamin (MULTIVITAMIN) tablet Take 1 tablet by mouth daily.   Yes Historical Provider, MD  OXYGEN Use.   Yes Historical Provider, MD  Potassium 99 MG TABS Take 99 mg by mouth at bedtime.    Yes Historical Provider, MD  ranitidine (ZANTAC) 150 MG tablet TAKE ONE  TABLET BY MOUTH TWICE DAILY HEARTBURN 07/14/16  Yes Historical Provider, MD  ranolazine (RANEXA) 500 MG 12 hr tablet Take 500 mg by mouth 2 (two) times daily.   Yes Historical Provider, MD  metoprolol succinate (TOPROL-XL) 50 MG 24 hr tablet Take 1 tablet (50 mg total) by mouth daily. Take with or immediately following a meal. 09/23/16 12/22/16  Delma Freezeina A Mekai Wilkinson, FNP    Review of Systems  Constitutional: Positive for fatigue. Negative for appetite change.  HENT: Negative for congestion, nosebleeds and sinus pressure.   Eyes: Negative.   Respiratory: Positive for shortness of breath and wheezing (when short of breath). Negative for chest tightness.   Cardiovascular: Negative for chest pain, palpitations and leg swelling.  Gastrointestinal: Positive for abdominal pain. Negative for abdominal distention.  Endocrine: Negative.   Genitourinary: Negative.   Musculoskeletal: Negative for back pain and neck pain.  Skin: Negative.   Allergic/Immunologic: Negative.   Neurological: Positive for headaches (right temple). Negative for weakness and light-headedness.  Hematological: Negative for adenopathy. Does not bruise/bleed easily.  Psychiatric/Behavioral: Positive for sleep disturbance (not sleeping well). Negative for dysphoric mood. The patient is not nervous/anxious.    Vitals:   09/23/16 1302  BP: (!) 155/60  Pulse: 85  Resp: 18  SpO2: 99%  Weight: 200 lb (90.7 kg)  Height: 5\' 4"  (1.626 m)    Wt Readings from Last 3 Encounters:  09/23/16 200 lb (90.7 kg)  09/21/16 200 lb (90.7 kg)  08/17/16 198 lb (89.8 kg)    Lab Results  Component Value Date   CREATININE 1.54 (H) 08/17/2016   CREATININE 1.81 (H) 06/21/2016   CREATININE 1.18 (H) 06/20/2016    Physical Exam  Constitutional: She is oriented to person, place, and time. She appears well-developed and well-nourished.  HENT:  Head: Normocephalic and atraumatic.  Eyes: Conjunctivae are normal. Pupils are equal, round, and reactive to  light.  Neck: Normal range of motion. Neck supple.  Cardiovascular: Normal rate and regular rhythm.   Pulmonary/Chest: Effort normal. She has no wheezes. She has no rales.  Abdominal: Soft. She exhibits no distension. There is no tenderness.  Musculoskeletal: She exhibits no edema or tenderness.  Neurological: She is alert and oriented to person, place, and time.  Skin: Skin is warm and dry.  Psychiatric: She has a normal mood and affect. Her behavior is normal. Thought content normal.  Nursing note and vitals reviewed.    Assessment & Plan:  1: Chronic heart failure with preserved ejection fraction- - NYHA class 3 - euvolemic - continue weighing daily and call for an overnight weight gain of >2 pounds or a weekly weight gain of >5 pounds - discussed the importance of not adding any salt to her food. Written dietary information was  given to her about following a 2000mg  sodium diet - does wear oxygen at 3L as needed - has not received her flu vaccine yet  2: HTN- - BP mildly elevated - she is supposed to be on BID metoprolol but she says that she stopped it because she didn't like how it made her feel but she'd like to try something else. - discussed trying toprol XL or carvedilol and she says that she'd like to try the toprol XL so this prescription was sent to her pharmacy. She also said that she sometimes has side effects from generic medications  3: Obstructive sleep apnea- - she says that she's had a sleep study done about 5 years ago but didn't have to wear CPAP - denies snoring or waking herself up  Patient did not bring her medications nor a list. Each medication was verbally reviewed with the patient and she was encouraged to bring the bottles to every visit to confirm accuracy of list. Her pharmacy was called to fax a list of current medications over to us.  Return here in 1 month or sooner for any questions/problems before then.

## 2016-10-17 ENCOUNTER — Encounter: Payer: Self-pay | Admitting: Family

## 2016-10-17 ENCOUNTER — Ambulatory Visit: Payer: Medicare HMO | Attending: Family | Admitting: Family

## 2016-10-17 VITALS — BP 150/72 | HR 84 | Resp 18 | Ht 64.0 in | Wt 200.0 lb

## 2016-10-17 DIAGNOSIS — E785 Hyperlipidemia, unspecified: Secondary | ICD-10-CM | POA: Insufficient documentation

## 2016-10-17 DIAGNOSIS — G4733 Obstructive sleep apnea (adult) (pediatric): Secondary | ICD-10-CM

## 2016-10-17 DIAGNOSIS — Z7982 Long term (current) use of aspirin: Secondary | ICD-10-CM | POA: Insufficient documentation

## 2016-10-17 DIAGNOSIS — K219 Gastro-esophageal reflux disease without esophagitis: Secondary | ICD-10-CM | POA: Diagnosis not present

## 2016-10-17 DIAGNOSIS — N289 Disorder of kidney and ureter, unspecified: Secondary | ICD-10-CM | POA: Diagnosis not present

## 2016-10-17 DIAGNOSIS — I10 Essential (primary) hypertension: Secondary | ICD-10-CM

## 2016-10-17 DIAGNOSIS — I110 Hypertensive heart disease with heart failure: Secondary | ICD-10-CM | POA: Diagnosis not present

## 2016-10-17 DIAGNOSIS — I5032 Chronic diastolic (congestive) heart failure: Secondary | ICD-10-CM | POA: Insufficient documentation

## 2016-10-17 DIAGNOSIS — Z8673 Personal history of transient ischemic attack (TIA), and cerebral infarction without residual deficits: Secondary | ICD-10-CM | POA: Insufficient documentation

## 2016-10-17 DIAGNOSIS — D649 Anemia, unspecified: Secondary | ICD-10-CM | POA: Diagnosis not present

## 2016-10-17 DIAGNOSIS — Z79899 Other long term (current) drug therapy: Secondary | ICD-10-CM | POA: Insufficient documentation

## 2016-10-17 NOTE — Progress Notes (Signed)
Patient ID: Rose [name], female    DOB: 03/04/1930, 82 y.o.   MRN: [id number]  HPI  Rose [name] is a 82 y/o female with a history of hyperlipidemia, murmur, HTN, renal insufficiency, GERD, obstructive sleep apnea, TIA, anemia, arrhythmia s/p ablation and chronic heart failure.  Last echo was done on 06/20/16 and showed an EF of 65-70% with mild/moderate MR, mild TR and mild/moderate aortic stenosis. This is essentially unchanged from her previous echo done on 10/14/15.  Last admitted on 06/20/16 with chest pain and acute on chronic heart failure exacerbation. She was treated with IV diuretics and had a cardiology consult done. She was discharged the following day.  She presents today for her follow-up visit with fatigue and shortness of breath upon exertion. Denies any swelling in her legs. Is already weighing herself daily. Does add "a little" salt to her food. Notices wheezing only when she feels short of breath. Says that she's tolerating the Toprol XL without any known side effects. Continues to have chronic difficulty sleeping even with the initiation of melatonin.  Past Medical History:  Diagnosis Date  . Allergic rhinitis   . Anemia   . Arrhythmia    s/p ablation for A-fib  . Carpal tunnel syndrome   . GERD (gastroesophageal reflux disease)   . Heart murmur   . Hyperlipidemia   . Hypertension   . Lumbar spine pain   . Nephrolithiasis   . Osteoarthritis   . Sleep apnea   . TIA (transient ischemic attack)     Past Surgical History:  Procedure Laterality Date  . ABDOMINAL HYSTERECTOMY    . ablation  January 2011   A-fib  . CARDIAC CATHETERIZATION    . ESOPHAGOGASTRODUODENOSCOPY N/A 05/22/2015   Procedure: ESOPHAGOGASTRODUODENOSCOPY (EGD);  Surgeon: Scot Junobert T Elliott, MD;  [facility];  Service: Endoscopy;  Laterality: N/A;  . RENAL ARTERY STENT    . REPLACEMENT TOTAL KNEE BILATERAL    . SAVORY DILATION N/A 05/22/2015   Procedure: SAVORY DILATION;  Surgeon: Scot Junobert T  Elliott, MD;  [facility];  Service: Endoscopy;  Laterality: N/A;  . TONSILLECTOMY      Family History  Problem Relation Age of Onset  . Diabetes Father   . Hypertension Mother     Social History  Substance Use Topics  . Smoking status: Never Smoker  . Smokeless tobacco: Never Used  . Alcohol use No    Allergies  Allergen Reactions  . Celecoxib Other (See Comments)    Unknown.  . Losartan Other (See Comments)    Unknown  . Avapro [Irbesartan] Other (See Comments)  . Clopidogrel Other (See Comments)  . Lisinopril Other (See Comments)  . Plavix [Clopidogrel Bisulfate]   . Pravachol   . Pravastatin Other (See Comments)  . Rosuvastatin   . Statins   . Tramadol Other (See Comments)  . Ultram [Tramadol Hcl]     Prior to Admission medications   Medication Sig Start Date End Date Taking? Authorizing Provider  albuterol (PROVENTIL HFA;VENTOLIN HFA) 108 (90 BASE) MCG/ACT inhaler Inhale 2 puffs into the lungs every 6 (six) hours as needed for wheezing or shortness of breath.   Yes Historical Provider, MD  aspirin EC 81 MG tablet Take 81 mg by mouth daily.   Yes Historical Provider, MD  atorvastatin (LIPITOR) 10 MG tablet Take 10 mg by mouth daily.   Yes Historical Provider, MD  Cholecalciferol (VITAMIN D3) 1000 units CAPS Take by mouth.   Yes Historical Provider, MD  docusate sodium (COLACE) 100 MG capsule Take 100 mg by mouth 2 (two) times daily.   Yes Historical Provider, MD  furosemide (LASIX) 40 MG tablet Take 1 tablet (40 mg total) by mouth daily. 10/15/15  Yes Alford Highlandichard Wieting, MD  Garlic 1000 MG CAPS Take 1,000 mg by mouth 1 day or 1 dose.   Yes Historical Provider, MD  hydrALAZINE (APRESOLINE) 10 MG tablet Take 1 tablet (10 mg total) by mouth 2 (two) times daily. 10/15/15  Yes Richard Renae GlossWieting, MD  lisinopril (PRINIVIL,ZESTRIL) 5 MG tablet Take 5 mg by mouth daily.   Yes Historical Provider, MD  Melatonin 5 MG CAPS Take 5-10 mg by mouth at bedtime.   Yes Historical  Provider, MD  metoprolol succinate (TOPROL-XL) 25 MG 24 hr tablet Take 25 mg by mouth daily.   Yes Historical Provider, MD  Multiple Vitamin (MULTIVITAMIN) tablet Take 1 tablet by mouth daily.   Yes Historical Provider, MD  omeprazole (PRILOSEC) 20 MG capsule Take 20 mg by mouth 2 (two) times daily.   Yes Historical Provider, MD  OXYGEN Use.   Yes Historical Provider, MD  Potassium 99 MG TABS Take 99 mg by mouth at bedtime.    Yes Historical Provider, MD  prazosin (MINIPRESS) 1 MG capsule Take 1 mg by mouth 2 (two) times daily.   Yes Historical Provider, MD  ranitidine (ZANTAC) 150 MG tablet TAKE ONE TABLET BY MOUTH TWICE DAILY HEARTBURN 07/14/16  Yes Historical Provider, MD  ranolazine (RANEXA) 500 MG 12 hr tablet Take 500 mg by mouth 2 (two) times daily.   Yes Historical Provider, MD    Review of Systems  Constitutional: Positive for fatigue. Negative for appetite change.  HENT: Positive for rhinorrhea. Negative for congestion and sore throat.   Eyes: Negative.   Respiratory: Positive for cough (dry cough), shortness of breath and wheezing (at times). Negative for chest tightness.   Cardiovascular: Negative for chest pain, palpitations and leg swelling.  Gastrointestinal: Negative for abdominal distention and abdominal pain.  Endocrine: Negative.   Genitourinary: Negative.   Musculoskeletal: Negative for back pain and neck pain.  Skin: Negative.   Allergic/Immunologic: Negative.   Neurological: Positive for light-headedness. Negative for dizziness.  Hematological: Negative for adenopathy. Does not bruise/bleed easily.  Psychiatric/Behavioral: Positive for sleep disturbance (not sleeping well at night; unsure of why). Negative for dysphoric mood. The patient is not nervous/anxious.    Vitals:   10/17/16 0916  BP: (!) 150/72  Pulse: 84  Resp: 18  SpO2: 97%  Weight: 200 lb (90.7 kg)  Height: 5\' 4"  (1.626 m)   Wt Readings from Last 3 Encounters:  10/17/16 200 lb (90.7 kg)  09/23/16  200 lb (90.7 kg)  09/21/16 200 lb (90.7 kg)   Lab Results  Component Value Date   CREATININE 1.54 (H) 08/17/2016   CREATININE 1.81 (H) 06/21/2016   CREATININE 1.18 (H) 06/20/2016    Physical Exam  Constitutional: She is oriented to person, place, and time. She appears well-developed and well-nourished.  HENT:  Head: Normocephalic and atraumatic.  Eyes: Conjunctivae are normal. Pupils are equal, round, and reactive to light.  Neck: Normal range of motion. Neck supple. No JVD present.  Cardiovascular: Normal rate and regular rhythm.   Pulmonary/Chest: Effort normal. She has no wheezes. She has no rales.  Abdominal: Soft. She exhibits no distension. There is no tenderness.  Musculoskeletal: She exhibits no edema or tenderness.  Neurological: She is alert and oriented to person, place, and time.  Skin: Skin  is warm and dry.  Psychiatric: She has a normal mood and affect. Her behavior is normal. Thought content normal.  Nursing note and vitals reviewed.     Assessment & Plan:  1: Chronic heart failure with preserved ejection fraction- - NYHA class 3 - euvolemic - continue weighing daily and call for an overnight weight gain of >2 pounds or a weekly weight gain of >5 pounds - says that she's adding some salt to her food and she was encouraged to not add any salt - does wear oxygen at 3L as needed - has received her flu & pneumonia vaccine for this season - sees cardiologist Juliann Pares) 01/10/17  2: HTN- - BP mildly elevated - Tolerating toprol XL She said that she sometimes has side effects from generic medications. - sees PCP on 10/28/16  3: Obstructive sleep apnea- - she says that she's had a sleep study done about 5 years ago but didn't have to wear CPAP - endorses snoring on occasion - denies sleeping during the day - says the melatonin helps initially but now isn't working - sees pulmonologist Meredeth Ide) 11/02/16   Return here in 3 months or sooner for any  questions/problems before then.

## 2016-10-17 NOTE — Patient Instructions (Signed)
Continue weighing daily and call for an overnight weight gain of > 2 pounds or a weekly weight gain of >5 pounds. 

## 2017-01-17 ENCOUNTER — Ambulatory Visit: Payer: Medicare HMO | Admitting: Family

## 2017-01-31 ENCOUNTER — Encounter: Payer: Self-pay | Admitting: Family

## 2017-01-31 ENCOUNTER — Ambulatory Visit: Payer: Medicare HMO | Attending: Family | Admitting: Family

## 2017-01-31 VITALS — BP 130/60 | HR 74 | Resp 20 | Ht 64.0 in | Wt 196.4 lb

## 2017-01-31 DIAGNOSIS — Z833 Family history of diabetes mellitus: Secondary | ICD-10-CM | POA: Insufficient documentation

## 2017-01-31 DIAGNOSIS — K219 Gastro-esophageal reflux disease without esophagitis: Secondary | ICD-10-CM | POA: Insufficient documentation

## 2017-01-31 DIAGNOSIS — I509 Heart failure, unspecified: Secondary | ICD-10-CM | POA: Diagnosis present

## 2017-01-31 DIAGNOSIS — I110 Hypertensive heart disease with heart failure: Secondary | ICD-10-CM | POA: Diagnosis not present

## 2017-01-31 DIAGNOSIS — E785 Hyperlipidemia, unspecified: Secondary | ICD-10-CM | POA: Insufficient documentation

## 2017-01-31 DIAGNOSIS — I5032 Chronic diastolic (congestive) heart failure: Secondary | ICD-10-CM | POA: Diagnosis not present

## 2017-01-31 DIAGNOSIS — Z7982 Long term (current) use of aspirin: Secondary | ICD-10-CM | POA: Insufficient documentation

## 2017-01-31 DIAGNOSIS — Z8673 Personal history of transient ischemic attack (TIA), and cerebral infarction without residual deficits: Secondary | ICD-10-CM | POA: Insufficient documentation

## 2017-01-31 DIAGNOSIS — Z888 Allergy status to other drugs, medicaments and biological substances status: Secondary | ICD-10-CM | POA: Insufficient documentation

## 2017-01-31 DIAGNOSIS — Z79899 Other long term (current) drug therapy: Secondary | ICD-10-CM | POA: Diagnosis not present

## 2017-01-31 DIAGNOSIS — M1990 Unspecified osteoarthritis, unspecified site: Secondary | ICD-10-CM | POA: Diagnosis not present

## 2017-01-31 DIAGNOSIS — D649 Anemia, unspecified: Secondary | ICD-10-CM | POA: Diagnosis not present

## 2017-01-31 DIAGNOSIS — J441 Chronic obstructive pulmonary disease with (acute) exacerbation: Secondary | ICD-10-CM

## 2017-01-31 DIAGNOSIS — N289 Disorder of kidney and ureter, unspecified: Secondary | ICD-10-CM | POA: Diagnosis not present

## 2017-01-31 DIAGNOSIS — Z8249 Family history of ischemic heart disease and other diseases of the circulatory system: Secondary | ICD-10-CM | POA: Diagnosis not present

## 2017-01-31 DIAGNOSIS — G4733 Obstructive sleep apnea (adult) (pediatric): Secondary | ICD-10-CM

## 2017-01-31 DIAGNOSIS — Z96653 Presence of artificial knee joint, bilateral: Secondary | ICD-10-CM | POA: Diagnosis not present

## 2017-01-31 DIAGNOSIS — I10 Essential (primary) hypertension: Secondary | ICD-10-CM

## 2017-01-31 MED ORDER — AZITHROMYCIN 250 MG PO TABS: ORAL_TABLET | ORAL | 1 refills | Status: DC

## 2017-01-31 MED ORDER — PREDNISONE 10 MG (21) PO TBPK: ORAL_TABLET | ORAL | 0 refills | Status: DC

## 2017-01-31 NOTE — Patient Instructions (Signed)
Continue weighing daily and call for an overnight weight gain of > 2 pounds or a weekly weight gain of >5 pounds. 

## 2017-01-31 NOTE — Progress Notes (Signed)
Patient ID: Rose [name], female    DOB: [birth year], 83 y.o.   MRN: [id number]  HPI  Rose [name] is a 83 y/o female with a history of hyperlipidemia, murmur, HTN, renal insufficiency, GERD, obstructive sleep apnea, TIA, anemia, arrhythmia s/p ablation and chronic heart failure.  Last echo was done on 06/20/16 and showed an EF of 65-70% with mild/moderate MR, mild TR and mild/moderate aortic stenosis. This is essentially unchanged from her previous echo done on 10/14/15.  Last admitted on 06/20/16 with chest pain and acute on chronic heart failure exacerbation. She was treated with IV diuretics and had a cardiology consult done. She was discharged the following day.  She presents today for her follow-up visit with fatigue and shortness of breath upon exertion. Denies any swelling in her legs. Is already weighing herself daily. Does add "a little" salt to her food. Has been wheezing and coughing more lately. Continues to have chronic difficulty sleeping even with the initiation of melatonin.  Past Medical History:  Diagnosis Date  . Allergic rhinitis   . Anemia   . Arrhythmia    s/p ablation for A-fib  . Carpal tunnel syndrome   . GERD (gastroesophageal reflux disease)   . Heart murmur   . Hyperlipidemia   . Hypertension   . Lumbar spine pain   . Nephrolithiasis   . Osteoarthritis   . Sleep apnea   . TIA (transient ischemic attack)    Past Surgical History:  Procedure Laterality Date  . ABDOMINAL HYSTERECTOMY    . ablation  January 2011   A-fib  . CARDIAC CATHETERIZATION    . ESOPHAGOGASTRODUODENOSCOPY N/A 05/22/2015   Procedure: ESOPHAGOGASTRODUODENOSCOPY (EGD);  Surgeon: Scot Jun, MD;  Location: [facility] ENDOSCOPY;  Service: Endoscopy;  Laterality: N/A;  . RENAL ARTERY STENT    . REPLACEMENT TOTAL KNEE BILATERAL    . SAVORY DILATION N/A 05/22/2015   Procedure: SAVORY DILATION;  Surgeon: Scot Jun, MD;  Location: [facility] ENDOSCOPY;  Service: Endoscopy;  Laterality: N/A;  .  TONSILLECTOMY     Family History  Problem Relation Age of Onset  . Diabetes Father   . Hypertension Mother    Social History  Substance Use Topics  . Smoking status: Never Smoker  . Smokeless tobacco: Never Used  . Alcohol use No   Allergies  Allergen Reactions  . Celecoxib Other (See Comments)    Unknown.  . Losartan Other (See Comments)    Unknown  . Avapro [Irbesartan] Other (See Comments)  . Clopidogrel Other (See Comments)  . Lisinopril Other (See Comments)  . Plavix [Clopidogrel Bisulfate]   . Pravachol   . Pravastatin Other (See Comments)  . Rosuvastatin   . Statins   . Tramadol Other (See Comments)  . Ultram [Tramadol Hcl]    Prior to Admission medications   Medication Sig Start Date End Date Taking? Authorizing Provider  albuterol (PROVENTIL HFA;VENTOLIN HFA) 108 (90 BASE) MCG/ACT inhaler Inhale 2 puffs into the lungs every 6 (six) hours as needed for wheezing or shortness of breath.   Yes Historical Provider, MD  aspirin EC 81 MG tablet Take 81 mg by mouth 3 (three) times a week.    Yes Historical Provider, MD  atorvastatin (LIPITOR) 10 MG tablet Take 10 mg by mouth daily.   Yes Historical Provider, MD  Cholecalciferol (VITAMIN D3) 1000 units CAPS Take by mouth.   Yes Historical Provider, MD  docusate sodium (COLACE) 100 MG capsule Take 100 mg by mouth 2 (two)  times daily.   Yes Historical Provider, MD  furosemide (LASIX) 40 MG tablet Take 1 tablet (40 mg total) by mouth daily. 10/15/15  Yes Alford Highlandichard Wieting, MD  Garlic 1000 MG CAPS Take 1,000 mg by mouth 1 day or 1 dose.   Yes Historical Provider, MD  hydrALAZINE (APRESOLINE) 10 MG tablet Take 1 tablet (10 mg total) by mouth 2 (two) times daily. 10/15/15  Yes Richard Renae GlossWieting, MD  losartan (COZAAR) 25 MG tablet Take 25 mg by mouth daily.   Yes Historical Provider, MD  Melatonin 5 MG CAPS Take 5-10 mg by mouth at bedtime.   Yes Historical Provider, MD  metoprolol succinate (TOPROL-XL) 25 MG 24 hr tablet Take 25 mg by  mouth daily.   Yes Historical Provider, MD  Multiple Vitamin (MULTIVITAMIN) tablet Take 1 tablet by mouth daily.   Yes Historical Provider, MD  omeprazole (PRILOSEC) 20 MG capsule Take 20 mg by mouth 2 (two) times daily.   Yes Historical Provider, MD  OXYGEN Use.   Yes Historical Provider, MD  Potassium 99 MG TABS Take 99 mg by mouth at bedtime.    Yes Historical Provider, MD  prazosin (MINIPRESS) 1 MG capsule Take 1 mg by mouth 2 (two) times daily.   Yes Historical Provider, MD  ranitidine (ZANTAC) 150 MG tablet TAKE ONE TABLET BY MOUTH TWICE DAILY HEARTBURN 07/14/16  Yes Historical Provider, MD  ranolazine (RANEXA) 500 MG 12 hr tablet Take 500 mg by mouth 2 (two) times daily.   Yes Historical Provider, MD                 Review of Systems  Constitutional: Positive for fatigue. Negative for appetite change.  HENT: Negative for congestion, postnasal drip and sore throat.   Eyes: Negative.   Respiratory: Positive for cough, shortness of breath and wheezing.   Cardiovascular: Positive for chest pain (with exertion) and palpitations. Negative for leg swelling.  Gastrointestinal: Negative for abdominal distention and abdominal pain.  Endocrine: Negative.   Genitourinary: Negative.   Musculoskeletal: Negative for back pain and neck pain.  Skin: Negative.   Allergic/Immunologic: Negative.   Neurological: Positive for light-headedness. Negative for dizziness.  Hematological: Negative for adenopathy. Does not bruise/bleed easily.  Psychiatric/Behavioral: Positive for sleep disturbance. Negative for dysphoric mood and suicidal ideas. The patient is not nervous/anxious.     Physical Exam  Constitutional: She is oriented to person, place, and time. She appears well-developed and well-nourished.  HENT:  Head: Normocephalic and atraumatic.  Eyes: Conjunctivae are normal. Pupils are equal, round, and reactive to light.  Neck: Normal range of motion. Neck supple. No JVD present.  Cardiovascular:  Normal rate and regular rhythm.   Pulmonary/Chest: Effort normal. She has wheezes in the right upper field, the right lower field, the left upper field and the left lower field. She has rhonchi in the right upper field, the right lower field, the left upper field and the left lower field. She has no rales.  Abdominal: Soft. She exhibits no distension. There is no tenderness.  Musculoskeletal: She exhibits no edema or tenderness.  Neurological: She is alert and oriented to person, place, and time.  Skin: Skin is warm and dry.  Psychiatric: She has a normal mood and affect. Her behavior is normal. Thought content normal.  Nursing note and vitals reviewed.  Vitals:   01/31/17 0854  BP: 130/60  Pulse: 74  Resp: 20  SpO2: 97%  Weight: 196 lb 6 oz (89.1 kg)  Height: 5\' 4"  (  1.626 m)   Wt Readings from Last 3 Encounters:  01/31/17 196 lb 6 oz (89.1 kg)  10/17/16 200 lb (90.7 kg)  09/23/16 200 lb (90.7 kg)   Lab Results  Component Value Date   CREATININE 1.54 (H) 08/17/2016   CREATININE 1.81 (H) 06/21/2016   CREATININE 1.18 (H) 06/20/2016      Assessment & Plan:  1: Chronic heart failure with preserved ejection fraction- - NYHA class 3 - euvolemic - continue weighing daily and call for an overnight weight gain of >2 pounds or a weekly weight gain of >5 pounds - says that she's adding some salt to her food and she was encouraged to not add any salt - does wear oxygen at 3L as needed and at bedtime - has received her flu & pneumonia vaccine for this season - saw cardiologist Juliann Pares) 01/10/17 - HF REDS vest reading was 30%  2: HTN- - BP looks good today - Tolerating toprol XL She said that she sometimes has side effects from generic medications. - saw PCP Allena Katz) 01/26/17  3: Obstructive sleep apnea- - she says that she's had a sleep study done about 5 years ago but didn't have to wear CPAP - endorses snoring on occasion - denies sleeping during the day - wearing oxygen at 3L  at bedtime - saw pulmonologist Meredeth Ide) 12/06/16  4: Acute COPD exacerbation- - wheezing throughout with increased shortness of breath - treat with zpack as directed - treat with prednisone taper; reminded her to take this with food - follow-up with pulmonologist if continues to wheeze  Return in 3 months or sooner for any questions/problems before then.

## 2017-02-01 DIAGNOSIS — J441 Chronic obstructive pulmonary disease with (acute) exacerbation: Secondary | ICD-10-CM | POA: Insufficient documentation

## 2017-03-11 ENCOUNTER — Emergency Department
Admission: EM | Admit: 2017-03-11 | Discharge: 2017-03-12 | Disposition: A | Discharge status: Home / Self Care | Payer: Medicare HMO | Attending: Emergency Medicine | Admitting: Emergency Medicine

## 2017-03-11 ENCOUNTER — Emergency Department: Payer: Medicare HMO

## 2017-03-11 DIAGNOSIS — Z79899 Other long term (current) drug therapy: Secondary | ICD-10-CM | POA: Diagnosis not present

## 2017-03-11 DIAGNOSIS — I5032 Chronic diastolic (congestive) heart failure: Secondary | ICD-10-CM | POA: Insufficient documentation

## 2017-03-11 DIAGNOSIS — I2510 Atherosclerotic heart disease of native coronary artery without angina pectoris: Secondary | ICD-10-CM | POA: Insufficient documentation

## 2017-03-11 DIAGNOSIS — Z7982 Long term (current) use of aspirin: Secondary | ICD-10-CM | POA: Diagnosis not present

## 2017-03-11 DIAGNOSIS — J441 Chronic obstructive pulmonary disease with (acute) exacerbation: Secondary | ICD-10-CM | POA: Diagnosis not present

## 2017-03-11 DIAGNOSIS — R072 Precordial pain: Secondary | ICD-10-CM | POA: Insufficient documentation

## 2017-03-11 DIAGNOSIS — I110 Hypertensive heart disease with heart failure: Secondary | ICD-10-CM | POA: Diagnosis not present

## 2017-03-11 DIAGNOSIS — R079 Chest pain, unspecified: Secondary | ICD-10-CM

## 2017-03-11 LAB — CBC
HEMATOCRIT: 31.3 % — AB (ref 35.0–47.0)
HEMOGLOBIN: 10.1 g/dL — AB (ref 12.0–16.0)
MCH: 28.1 pg (ref 26.0–34.0)
MCHC: 32.4 g/dL (ref 32.0–36.0)
MCV: 86.8 fL (ref 80.0–100.0)
PLATELETS: 214 10*3/uL (ref 150–440)
RBC: 3.6 MIL/uL — AB (ref 3.80–5.20)
RDW: 15.9 % — ABNORMAL HIGH (ref 11.5–14.5)
WBC: 7.3 10*3/uL (ref 3.6–11.0)

## 2017-03-11 LAB — TROPONIN I: Troponin I: <0.03 ng/mL (ref <0.03)

## 2017-03-11 LAB — BASIC METABOLIC PANEL
ANION GAP: 11 (ref 5–15)
BUN: 35 mg/dL — ABNORMAL HIGH (ref 6–20)
CO2: 29 mmol/L (ref 22–32)
Calcium: 9.2 mg/dL (ref 8.9–10.3)
Chloride: 101 mmol/L (ref 101–111)
Creatinine, Ser: 1.76 mg/dL — ABNORMAL HIGH (ref 0.44–1.00)
GFR calc Af Amer: 29 mL/min — ABNORMAL LOW (ref >60)
GFR calc non Af Amer: 25 mL/min — ABNORMAL LOW (ref >60)
GLUCOSE: 139 mg/dL — AB (ref 65–99)
POTASSIUM: 4 mmol/L (ref 3.5–5.1)
Sodium: 141 mmol/L (ref 135–145)

## 2017-03-11 MED ORDER — ACETAMINOPHEN 325 MG PO TABS
650.0000 mg | ORAL_TABLET | Freq: Once | ORAL | Status: AC
  Administered 2017-03-12: 650 mg via ORAL
  Filled 2017-03-11: qty 2

## 2017-03-11 NOTE — ED Triage Notes (Signed)
Pt arrived via POV from home with reports of chest pain over the past week, worse today. States pain radiates from the center of her chest to her left arm. C/o of minimal SOB, worse earlier this week having to use oxygen continuously instead of at night. States Wednesday her sats were low. Pt seen by lung MD on Wednesday.

## 2017-03-12 MED ORDER — ACETAMINOPHEN 325 MG PO TABS: 650.0000 mg | ORAL_TABLET | ORAL | 0 refills | Status: DC | PRN

## 2017-03-12 NOTE — ED Notes (Signed)
Pt discharged to home.  Family member driving.  Discharge instructions reviewed.  Verbalized understanding.  No questions or concerns at this time.  Teach back verified.  Pt in NAD.  No items left in ED.   

## 2017-03-12 NOTE — ED Provider Notes (Signed)
[facility] Emergency Department Provider Note   ____________________________________________   First MD Initiated Contact with Patient 03/11/17 2309     (approximate)  I have reviewed the triage vital signs and the nursing notes.   HISTORY  Chief Complaint Chest Pain    HPI Rose [name] is a 83 y.o. female who comes into the hospital today with chest pain. The patient reports that she's had this pain all week. His left-sided chest pain. The patient has been taking her regular medicine but has not taken anything else for pain. She reports that she saw her lung doctor on Wednesday. Her lung doctor wanted her to see her cardiologist on the same day but she reports that she did not have a ride to get home so she did not go to see her doctor. She changed her appointment this coming Tuesday. The patient reports that the pain is worse when she is walking. She reports that she is here tonight because it is not getting better and she didn't think she could tolerate it until Tuesday. Currently the patient's pain is a 0 out of 10 in intensity. The patient reports that she's been short of breath and nauseous but denies any sweats, dizziness, vomiting. The patient reports that her pain does not get worse when she takes a deep breath. She is here today for evaluation of this chest pain.   Past Medical History:  Diagnosis Date  . Allergic rhinitis   . Anemia   . Arrhythmia    s/p ablation for A-fib  . Carpal tunnel syndrome   . GERD (gastroesophageal reflux disease)   . Heart murmur   . Hyperlipidemia   . Hypertension   . Lumbar spine pain   . Nephrolithiasis   . Osteoarthritis   . Sleep apnea   . TIA (transient ischemic attack)     Patient Active Problem List   Diagnosis Date Noted  . COPD with acute exacerbation (HCC) 02/01/2017  . Chronic diastolic heart failure (HCC) 09/23/2016  . GERD (gastroesophageal reflux disease) 08/17/2016  . Headache 08/17/2016   . History of PSVT (paroxysmal supraventricular tachycardia) 08/17/2016  . OA (osteoarthritis) 08/17/2016  . OSA (obstructive sleep apnea) 08/17/2016  . TIA (transient ischemic attack) 08/17/2016  . Risk for falls 07/27/2016  . Diverticulitis of large intestine without perforation or abscess without bleeding 04/16/2015  . SOB (shortness of breath) 08/05/2011  . HTN (hypertension) 08/05/2011  . Hyperlipidemia 08/05/2011  . PVD (peripheral vascular disease) (HCC) 08/05/2011  . CAD (coronary artery disease) 08/05/2011    Past Surgical History:  Procedure Laterality Date  . ABDOMINAL HYSTERECTOMY    . ablation  January 2011   A-fib  . CARDIAC CATHETERIZATION    . ESOPHAGOGASTRODUODENOSCOPY N/A 05/22/2015   Procedure: ESOPHAGOGASTRODUODENOSCOPY (EGD);  Surgeon: Scot Jun, MD;  Location: [facility] ENDOSCOPY;  Service: Endoscopy;  Laterality: N/A;  . RENAL ARTERY STENT    . REPLACEMENT TOTAL KNEE BILATERAL    . SAVORY DILATION N/A 05/22/2015   Procedure: SAVORY DILATION;  Surgeon: Scot Jun, MD;  Location: [facility] ENDOSCOPY;  Service: Endoscopy;  Laterality: N/A;  . TONSILLECTOMY      Prior to Admission medications   Medication Sig Start Date End Date Taking? Authorizing Provider  acetaminophen (TYLENOL) 325 MG tablet Take 2 tablets (650 mg total) by mouth every 4 (four) hours as needed. 03/12/17 03/12/18  Rebecka Apley, MD  albuterol (PROVENTIL HFA;VENTOLIN HFA) 108 (90 BASE) MCG/ACT inhaler Inhale 2 puffs into  the lungs every 6 (six) hours as needed for wheezing or shortness of breath.    Historical Provider, MD  aspirin EC 81 MG tablet Take 81 mg by mouth 3 (three) times a week.     Historical Provider, MD  atorvastatin (LIPITOR) 10 MG tablet Take 10 mg by mouth daily.    Historical Provider, MD  azithromycin (ZITHROMAX Z-PAK) 250 MG tablet Take 2 tablets on day 1 and then 1 tablet daily for the next 4 days 01/31/17   Delma Freeze, FNP  Cholecalciferol (VITAMIN D3) 1000 units  CAPS Take by mouth.    Historical Provider, MD  docusate sodium (COLACE) 100 MG capsule Take 100 mg by mouth 2 (two) times daily.    Historical Provider, MD  furosemide (LASIX) 40 MG tablet Take 1 tablet (40 mg total) by mouth daily. 10/15/15   Alford Highland, MD  Garlic 1000 MG CAPS Take 1,000 mg by mouth 1 day or 1 dose.    Historical Provider, MD  hydrALAZINE (APRESOLINE) 10 MG tablet Take 1 tablet (10 mg total) by mouth 2 (two) times daily. 10/15/15   Alford Highland, MD  losartan (COZAAR) 25 MG tablet Take 25 mg by mouth daily.    Historical Provider, MD  Melatonin 5 MG CAPS Take 5-10 mg by mouth at bedtime.    Historical Provider, MD  metoprolol succinate (TOPROL-XL) 25 MG 24 hr tablet Take 25 mg by mouth daily.    Historical Provider, MD  Multiple Vitamin (MULTIVITAMIN) tablet Take 1 tablet by mouth daily.    Historical Provider, MD  omeprazole (PRILOSEC) 20 MG capsule Take 20 mg by mouth 2 (two) times daily.    Historical Provider, MD  OXYGEN Use.    Historical Provider, MD  Potassium 99 MG TABS Take 99 mg by mouth at bedtime.     Historical Provider, MD  prazosin (MINIPRESS) 1 MG capsule Take 1 mg by mouth 2 (two) times daily.    Historical Provider, MD  predniSONE (STERAPRED UNI-PAK 21 TAB) 10 MG (21) TBPK tablet Take as directed with food. 01/31/17   Delma Freeze, FNP  ranitidine (ZANTAC) 150 MG tablet TAKE ONE TABLET BY MOUTH TWICE DAILY HEARTBURN 07/14/16   Historical Provider, MD  ranolazine (RANEXA) 500 MG 12 hr tablet Take 500 mg by mouth 2 (two) times daily.    Historical Provider, MD    Allergies Celecoxib; Losartan; Avapro [irbesartan]; Clopidogrel; Lisinopril; Plavix [clopidogrel bisulfate]; Pravachol; Pravastatin; Rosuvastatin; Statins; Tramadol; and Ultram [tramadol hcl]  Family History  Problem Relation Age of Onset  . Diabetes Father   . Hypertension Mother     Social History Social History  Substance Use Topics  . Smoking status: Never Smoker  . Smokeless  tobacco: Never Used  . Alcohol use No    Review of Systems  Constitutional: No fever/chills Eyes: No visual changes. ENT: No sore throat. Cardiovascular:  chest pain. Respiratory:  shortness of breath. Gastrointestinal: Nausea with No abdominal pain.   no vomiting.  No diarrhea.  No constipation. Genitourinary: Negative for dysuria. Musculoskeletal: Negative for back pain. Skin: Negative for rash. Neurological: Negative for headaches, focal weakness or numbness.   ____________________________________________   PHYSICAL EXAM:  VITAL SIGNS: ED Triage Vitals  Enc Vitals Group     BP 03/11/17 1903 140/63     Pulse Rate 03/11/17 1903 77     Resp 03/11/17 1903 18     Temp 03/11/17 1903 98.7 F (37.1 C)     Temp Source 03/11/17  1903 Oral     SpO2 03/11/17 1903 97 %     Weight 03/11/17 1855 190 lb (86.2 kg)     Height 03/11/17 1855  (1.626 m)     Head Circumference --      Peak Flow --      Pain Score 03/11/17 1855 4     Pain Loc --      Pain Edu? --      Excl. in GC? --     Constitutional: Alert and oriented. Well appearing and in no acute distress. Eyes: Conjunctivae are normal. PERRL. EOMI. Head: Atraumatic. Nose: No congestion/rhinnorhea. Mouth/Throat: Mucous membranes are moist.  Oropharynx non-erythematous. Cardiovascular: Normal rate, regular rhythm. Systolic murmur auscultated  Good peripheral circulation. Respiratory: Normal respiratory effort.  No retractions. Lungs CTAB. Left chest tender to palpation along the sternal border Gastrointestinal: Soft and nontender. No distention. Positive bowel sounds Musculoskeletal: No lower extremity tenderness nor edema.   Neurologic:  Normal speech and language.  Skin:  Skin is warm, dry and intact. Psychiatric: Mood and affect are normal. .  ____________________________________________   LABS (all labs ordered are listed, but only abnormal results are displayed)  Labs Reviewed  BASIC METABOLIC PANEL -  Abnormal; Notable for the following:       Result Value   Glucose, Bld 139 (*)    BUN 35 (*)    Creatinine, Ser 1.76 (*)    GFR calc non Af Amer 25 (*)    GFR calc Af Amer 29 (*)    All other components within normal limits  CBC - Abnormal; Notable for the following:    RBC 3.60 (*)    Hemoglobin 10.1 (*)    HCT 31.3 (*)    RDW 15.9 (*)    All other components within normal limits  TROPONIN I  TROPONIN I   ____________________________________________  EKG  ED ECG REPORT I, Rebecka Apley, the attending physician, personally viewed and interpreted this ECG.   Date: 03/11/2017  EKG Time: 1852  Rate: 82  Rhythm: normal sinus rhythm  Axis: left axis deviation  Intervals:none  ST&T Change: none  ____________________________________________  RADIOLOGY  CXR ____________________________________________   PROCEDURES  Procedure(s) performed: None  Procedures  Critical Care performed: No  ____________________________________________   INITIAL IMPRESSION / ASSESSMENT AND PLAN / ED COURSE  Pertinent labs & imaging results that were available during my care of the patient were reviewed by me and considered in my medical decision making (see chart for details).  This is an 83 year old female who comes into the hospital today with chest pain. The patient has been having this pain for an entire week. She had an opportunity to see her cardiologist but did not see her doctor because she did not have a ride home. At this time the patient's pain is gone. Her blood work is unremarkable. She does have some elevation of her creatinine but that is not unremarkable for her kidney disease. I did give the patient some Tylenol as her pain was reproducible and as her repeat troponin was negative I will have the patient follow-up with her cardiologist as she has scheduled. The patient will be discharged home to follow-up with her doctor. She should return with any worsening symptoms or  any other pains or any other concerns.  Clinical Course as of Mar 12 20  Sat Mar 11, 2017  2308 Enlarged cardiac silhouette. Mild pulmonary vascular congestion.    DG Chest 2 View [AW]  Clinical Course User Index [AW] Rebecka Apley, MD     ____________________________________________   FINAL CLINICAL IMPRESSION(S) / ED DIAGNOSES  Final diagnoses:  Chest pain, unspecified type      NEW MEDICATIONS STARTED DURING THIS VISIT:  New Prescriptions   ACETAMINOPHEN (TYLENOL) 325 MG TABLET    Take 2 tablets (650 mg total) by mouth every 4 (four) hours as needed.     Note:  This document was prepared using Dragon voice recognition software and may include unintentional dictation errors.    Rebecka Apley, MD 03/12/17 [phone]

## 2017-03-12 NOTE — Discharge Instructions (Signed)
Please follow up with Dr Juliann Pares as you have scheduled. Please take some tylenol for your pain to evaluate if that will help. Please return with any worsened condition or any other concerns

## 2017-05-09 ENCOUNTER — Ambulatory Visit: Payer: Medicare HMO | Attending: Family | Admitting: Family

## 2017-05-09 ENCOUNTER — Encounter: Payer: Self-pay | Admitting: Family

## 2017-05-09 VITALS — BP 121/55 | HR 76 | Resp 18 | Ht 64.0 in | Wt 194.4 lb

## 2017-05-09 DIAGNOSIS — R011 Cardiac murmur, unspecified: Secondary | ICD-10-CM | POA: Insufficient documentation

## 2017-05-09 DIAGNOSIS — N2889 Other specified disorders of kidney and ureter: Secondary | ICD-10-CM | POA: Diagnosis not present

## 2017-05-09 DIAGNOSIS — G4733 Obstructive sleep apnea (adult) (pediatric): Secondary | ICD-10-CM | POA: Insufficient documentation

## 2017-05-09 DIAGNOSIS — Z8673 Personal history of transient ischemic attack (TIA), and cerebral infarction without residual deficits: Secondary | ICD-10-CM | POA: Insufficient documentation

## 2017-05-09 DIAGNOSIS — K219 Gastro-esophageal reflux disease without esophagitis: Secondary | ICD-10-CM | POA: Insufficient documentation

## 2017-05-09 DIAGNOSIS — I10 Essential (primary) hypertension: Secondary | ICD-10-CM

## 2017-05-09 DIAGNOSIS — I110 Hypertensive heart disease with heart failure: Secondary | ICD-10-CM | POA: Diagnosis not present

## 2017-05-09 DIAGNOSIS — R079 Chest pain, unspecified: Secondary | ICD-10-CM | POA: Diagnosis not present

## 2017-05-09 DIAGNOSIS — D649 Anemia, unspecified: Secondary | ICD-10-CM | POA: Insufficient documentation

## 2017-05-09 DIAGNOSIS — Z7982 Long term (current) use of aspirin: Secondary | ICD-10-CM | POA: Diagnosis not present

## 2017-05-09 DIAGNOSIS — M1990 Unspecified osteoarthritis, unspecified site: Secondary | ICD-10-CM | POA: Diagnosis not present

## 2017-05-09 DIAGNOSIS — G5600 Carpal tunnel syndrome, unspecified upper limb: Secondary | ICD-10-CM | POA: Insufficient documentation

## 2017-05-09 DIAGNOSIS — I350 Nonrheumatic aortic (valve) stenosis: Secondary | ICD-10-CM | POA: Insufficient documentation

## 2017-05-09 DIAGNOSIS — Z9889 Other specified postprocedural states: Secondary | ICD-10-CM | POA: Diagnosis not present

## 2017-05-09 DIAGNOSIS — E785 Hyperlipidemia, unspecified: Secondary | ICD-10-CM | POA: Diagnosis not present

## 2017-05-09 DIAGNOSIS — I5032 Chronic diastolic (congestive) heart failure: Secondary | ICD-10-CM | POA: Insufficient documentation

## 2017-05-09 DIAGNOSIS — Z96653 Presence of artificial knee joint, bilateral: Secondary | ICD-10-CM | POA: Diagnosis not present

## 2017-05-09 DIAGNOSIS — R0789 Other chest pain: Secondary | ICD-10-CM

## 2017-05-09 NOTE — Progress Notes (Signed)
[name] ID: Rose [name], female    DOB: 01/25/1930, 83 y.o.   MRN: [id number]  HPI  Rose [name] is a 83 y/o female with a history of hyperlipidemia, murmur, HTN, renal insufficiency, GERD, obstructive sleep apnea, TIA, anemia, arrhythmia s/p ablation and chronic heart failure.  Last echo was done on 06/20/16 and showed an EF of 65-70% with mild/moderate MR, mild TR and mild/moderate aortic stenosis. This is essentially unchanged from her previous echo done on 10/14/15.  In [facility] ED 03/11/17 due to chest pain. Labs/EKG were normal and she was released home. Last admitted on 06/20/16 with chest pain and acute on chronic heart failure exacerbation. She was treated with IV diuretics and had a cardiology consult done. She was discharged [facility] following day.  She presents today for her follow-up visit with a chief complaint of moderate shortness of breath with minimal exertion. Denies shortness of breath at rest. She says her shortness of breath has been present for years with varying levels of severity. She has associated fatigue, cough, chest pain and dizziness along with this.   Past Medical History:  Diagnosis Date  . Allergic rhinitis   . Anemia   . Arrhythmia    s/p ablation for A-fib  . Carpal tunnel syndrome   . GERD (gastroesophageal reflux disease)   . Heart murmur   . Hyperlipidemia   . Hypertension   . Lumbar spine pain   . Nephrolithiasis   . Osteoarthritis   . Sleep apnea   . TIA (transient ischemic attack)    Past Surgical History:  Procedure Laterality Date  . ABDOMINAL HYSTERECTOMY    . ablation  January 2011   A-fib  . CARDIAC CATHETERIZATION    . ESOPHAGOGASTRODUODENOSCOPY N/A 05/22/2015   Procedure: ESOPHAGOGASTRODUODENOSCOPY (EGD);  Surgeon: Scot Junobert T Elliott, MD;  Location[facility] ENDOSCOPY;  Service: Endoscopy;  Laterality: N/A;  . RENAL ARTERY STENT    . REPLACEMENT TOTAL KNEE BILATERAL    . SAVORY DILATION N/A 05/22/2015   Procedure: SAVORY DILATION;  Surgeon: Scot Junobert T  Elliott, MD;  [facility];  Service: Endoscopy;  Laterality: N/A;  . TONSILLECTOMY     Family History  Problem Relation Age of Onset  . Diabetes Father   . Hypertension Mother    Social History  Substance Use Topics  . Smoking status: Never Smoker  . Smokeless tobacco: Never Used  . Alcohol use No   Allergies  Allergen Reactions  . Celecoxib Other (See Comments)    Unknown.  . Losartan Other (See Comments)    Unknown  . Avapro [Irbesartan] Other (See Comments)  . Clopidogrel Other (See Comments)  . Lisinopril Other (See Comments)  . Plavix [Clopidogrel Bisulfate]   . Pravachol   . Pravastatin Other (See Comments)  . Rosuvastatin   . Statins   . Tramadol Other (See Comments)  . Ultram [Tramadol Hcl]    Prior to Admission medications   Medication Sig Start Date End Date Taking? Authorizing Provider  acetaminophen (TYLENOL) 325 MG tablet Take 2 tablets (650 mg total) by mouth every 4 (four) hours as needed. 03/12/17 03/12/18 Yes Rebecka ApleyWebster, Allison P, MD  albuterol (PROVENTIL HFA;VENTOLIN HFA) 108 (90 BASE) MCG/ACT inhaler Inhale 2 puffs into [facility] lungs every 6 (six) hours as needed for wheezing or shortness of breath.   Yes [provider]  aspirin EC 81 MG tablet Take 81 mg by mouth 3 (three) times a week.    Yes [provider]  atorvastatin (LIPITOR) 10  MG tablet Take 10 mg by mouth daily.   Yes [provider]  budesonide-formoterol (SYMBICORT) 80-4.5 MCG/ACT inhaler Inhale 2 puffs into Rose lungs 2 (two) times daily.   Yes [provider]  Cholecalciferol (VITAMIN D3) 1000 units CAPS Take by mouth daily.    Yes [provider]  docusate sodium (COLACE) 100 MG capsule Take 100 mg by mouth 2 (two) times daily.   Yes [provider]  furosemide (LASIX) 40 MG tablet Take 1 tablet (40 mg total) by mouth daily. 10/15/15  Yes Wieting, Richard, MD  Garlic 1000 MG CAPS Take 1,000 mg by mouth 1 day or 1 dose.   Yes [provider]  hydrALAZINE (APRESOLINE) 10 MG tablet Take 1 tablet (10 mg total) by mouth 2 (two) times daily. 10/15/15  Yes Wieting, Richard, MD  isosorbide mononitrate (IMDUR) 30 MG 24 hr tablet Take 30 mg by mouth daily. 03/15/17 03/15/18 Yes [provider]  losartan (COZAAR) 25 MG tablet Take 25 mg by mouth daily.   Yes [provider]  Magnesium 250 MG TABS Take by mouth daily.   Yes [provider]  Melatonin 5 MG CAPS Take 5-10 mg by mouth at bedtime.   Yes [provider]  metoprolol succinate (TOPROL-XL) 25 MG 24 hr tablet Take 25 mg by mouth daily.   Yes [provider]  Multiple Vitamin (MULTIVITAMIN) tablet Take 1 tablet by mouth daily.   Yes [provider]  OXYGEN Use.   Yes [provider]  Potassium 99 MG TABS Take 99 mg by mouth at bedtime.    Yes [provider]  ranitidine (ZANTAC) 150 MG tablet TAKE ONE TABLET BY MOUTH TWICE DAILY HEARTBURN 07/14/16  Yes [provider]  ranolazine (RANEXA) 500 MG 12 hr tablet Take 500 mg by mouth 2 (two) times daily.   Yes [provider]  tiotropium (SPIRIVA) 18 MCG inhalation capsule Place 1 capsule into inhaler and inhale daily. 03/08/17 03/08/18  [provider]    Review of Systems  Constitutional: Positive for fatigue. Negative for appetite change.  HENT: Negative for congestion, postnasal drip and sore throat.   Eyes: Negative.   Respiratory: Positive for cough, shortness of breath (with little exertion) and wheezing. Negative for chest tightness.   Cardiovascular: Positive for chest pain (with little exertion) and leg swelling. Negative for palpitations.  Gastrointestinal: Negative for abdominal distention and abdominal pain.  Endocrine: Negative.   Genitourinary: Negative.   Musculoskeletal: Negative for back pain and neck pain.  Skin: Negative.   Allergic/Immunologic: Negative.   Neurological: Positive for dizziness. Negative for  light-headedness.  Hematological: Negative for adenopathy. Does not bruise/bleed easily.  Psychiatric/Behavioral: Positive for sleep disturbance. Negative for dysphoric mood and suicidal ideas. Rose [name] is not nervous/anxious.     Physical Exam  Constitutional: She is oriented to person, place, and time. She appears well-developed and well-nourished.  HENT:  Head: Normocephalic and atraumatic.  Neck: Normal range of motion. Neck supple. No JVD present.  Cardiovascular: Normal rate and regular rhythm.   Pulmonary/Chest: Effort normal. She has no wheezes. She has no rhonchi. She has no rales.  Abdominal: Soft. She exhibits no distension. There is no tenderness.  Musculoskeletal: She exhibits edema (trace edema in bilateral ankles). She exhibits no tenderness.  Neurological: She is alert and oriented to person, place, and time.  Skin: Skin is warm and dry.  Psychiatric: She has a normal mood and affect. Her behavior is normal. Thought content  normal.  Nursing note and vitals reviewed.  Vitals:   05/09/17 0907  BP: (!) 121/55  Pulse: 76  Resp: 18  SpO2: 94%  Weight: 194 lb 6 oz (88.2 kg)  Height: 5\' 4"  (1.626 m)   Wt Readings from Last 3 Encounters:  05/09/17 194 lb 6 oz (88.2 kg)  03/11/17 190 lb (86.2 kg)  01/31/17 196 lb 6 oz (89.1 kg)   Lab Results  Component Value Date   CREATININE 1.76 (H) 03/11/2017   CREATININE 1.54 (H) 08/17/2016   CREATININE 1.81 (H) 06/21/2016      Assessment & Plan:  1: Chronic heart failure with preserved ejection fraction- - NYHA class III - euvolemic - continue weighing daily and call for an overnight weight gain of >2 pounds or a weekly weight gain of >5 pounds - says that she's adding some salt to her food and she was encouraged to not add any salt - does wear oxygen at 3L as needed and at bedtime - saw cardiologist Juliann Pares) 01/10/17 - PharmD reviewed medications with [facility] [name] - elevate legs when sitting for long periods of  time  2: HTN- - BP looks good today - Tolerating toprol XL She said that she sometimes has side effects from generic medications. - saw PCP Allena Katz) 05/03/17  3: Obstructive sleep apnea- - she says that she's had a sleep study done about 5 years ago but didn't have to wear CPAP - endorses snoring on occasion - denies sleeping during [facility] day - wearing oxygen at 3L at bedtime - saw pulmonologist Meredeth Ide) 03/15/17  4: Chest pain- - has had some intermittent chest pain which resolves on its own - currently scheduled for a stress test on 05/11/17 - to present to [facility] ED if her chest pain pattern changes.   Return in 3 months or sooner for any questions/problems before then.

## 2017-05-09 NOTE — Patient Instructions (Signed)
Continue weighing daily and call for an overnight weight gain of > 2 pounds or a weekly weight gain of >5 pounds. 

## 2017-05-25 ENCOUNTER — Other Ambulatory Visit: Payer: Self-pay | Admitting: Cardiology

## 2017-05-25 NOTE — OR Nursing (Signed)
Notified taurus that patient needs orders entered for procedure tomorrow.

## 2017-05-26 ENCOUNTER — Ambulatory Visit
Admission: RE | Admit: 2017-05-26 | Discharge: 2017-05-26 | Disposition: A | Discharge status: Home / Self Care | Payer: Medicare HMO | Source: Ambulatory Visit | Attending: Internal Medicine | Admitting: Internal Medicine

## 2017-05-26 ENCOUNTER — Encounter
Admission: RE | Disposition: A | Discharge status: Home / Self Care | Payer: Self-pay | Source: Ambulatory Visit | Attending: Internal Medicine

## 2017-05-26 ENCOUNTER — Ambulatory Visit
Admission: RE | Admit: 2017-05-26 | Discharge: 2017-05-26 | Disposition: A | Discharge status: Home / Self Care | Payer: Medicare HMO | Source: Ambulatory Visit | Attending: Cardiology | Admitting: Cardiology

## 2017-05-26 DIAGNOSIS — I130 Hypertensive heart and chronic kidney disease with heart failure and stage 1 through stage 4 chronic kidney disease, or unspecified chronic kidney disease: Secondary | ICD-10-CM | POA: Diagnosis not present

## 2017-05-26 DIAGNOSIS — I5032 Chronic diastolic (congestive) heart failure: Secondary | ICD-10-CM | POA: Insufficient documentation

## 2017-05-26 DIAGNOSIS — E785 Hyperlipidemia, unspecified: Secondary | ICD-10-CM | POA: Diagnosis not present

## 2017-05-26 DIAGNOSIS — R5382 Chronic fatigue, unspecified: Secondary | ICD-10-CM | POA: Insufficient documentation

## 2017-05-26 DIAGNOSIS — G4733 Obstructive sleep apnea (adult) (pediatric): Secondary | ICD-10-CM | POA: Diagnosis not present

## 2017-05-26 DIAGNOSIS — R0902 Hypoxemia: Secondary | ICD-10-CM | POA: Insufficient documentation

## 2017-05-26 DIAGNOSIS — Z6835 Body mass index (BMI) 35.0-35.9, adult: Secondary | ICD-10-CM | POA: Diagnosis not present

## 2017-05-26 DIAGNOSIS — E669 Obesity, unspecified: Secondary | ICD-10-CM | POA: Diagnosis not present

## 2017-05-26 DIAGNOSIS — N183 Chronic kidney disease, stage 3 (moderate): Secondary | ICD-10-CM | POA: Insufficient documentation

## 2017-05-26 DIAGNOSIS — I342 Nonrheumatic mitral (valve) stenosis: Secondary | ICD-10-CM | POA: Diagnosis present

## 2017-05-26 DIAGNOSIS — G43909 Migraine, unspecified, not intractable, without status migrainosus: Secondary | ICD-10-CM | POA: Insufficient documentation

## 2017-05-26 DIAGNOSIS — I739 Peripheral vascular disease, unspecified: Secondary | ICD-10-CM | POA: Insufficient documentation

## 2017-05-26 DIAGNOSIS — Z8673 Personal history of transient ischemic attack (TIA), and cerebral infarction without residual deficits: Secondary | ICD-10-CM | POA: Diagnosis not present

## 2017-05-26 DIAGNOSIS — Z7982 Long term (current) use of aspirin: Secondary | ICD-10-CM | POA: Diagnosis not present

## 2017-05-26 DIAGNOSIS — I25119 Atherosclerotic heart disease of native coronary artery with unspecified angina pectoris: Secondary | ICD-10-CM | POA: Insufficient documentation

## 2017-05-26 DIAGNOSIS — M1990 Unspecified osteoarthritis, unspecified site: Secondary | ICD-10-CM | POA: Insufficient documentation

## 2017-05-26 DIAGNOSIS — K219 Gastro-esophageal reflux disease without esophagitis: Secondary | ICD-10-CM | POA: Diagnosis not present

## 2017-05-26 HISTORY — PX: TEE WITHOUT CARDIOVERSION: SHX5443

## 2017-05-26 SURGERY — ECHOCARDIOGRAM, TRANSESOPHAGEAL
Anesthesia: Moderate Sedation

## 2017-05-26 MED ORDER — MIDAZOLAM HCL 2 MG/2ML IJ SOLN
INTRAMUSCULAR | Status: AC | PRN
  Administered 2017-05-26: 2 mg via INTRAVENOUS

## 2017-05-26 MED ORDER — SODIUM CHLORIDE 0.9 % IV SOLN: INTRAVENOUS | Status: DC

## 2017-05-26 MED ORDER — SODIUM CHLORIDE FLUSH 0.9 % IV SOLN
INTRAVENOUS | Status: AC
  Filled 2017-05-26: qty 10

## 2017-05-26 MED ORDER — MIDAZOLAM HCL 5 MG/5ML IJ SOLN
INTRAMUSCULAR | Status: AC
  Filled 2017-05-26: qty 5

## 2017-05-26 MED ORDER — FENTANYL CITRATE (PF) 100 MCG/2ML IJ SOLN
INTRAMUSCULAR | Status: AC | PRN
  Administered 2017-05-26: 50 ug via INTRAVENOUS

## 2017-05-26 MED ORDER — BUTAMBEN-TETRACAINE-BENZOCAINE 2-2-14 % EX AERO
INHALATION_SPRAY | CUTANEOUS | Status: AC
  Filled 2017-05-26: qty 20

## 2017-05-26 MED ORDER — LIDOCAINE VISCOUS 2 % MT SOLN
OROMUCOSAL | Status: AC
  Filled 2017-05-26: qty 15

## 2017-05-26 MED ORDER — FENTANYL CITRATE (PF) 100 MCG/2ML IJ SOLN
INTRAMUSCULAR | Status: AC
  Filled 2017-05-26: qty 2

## 2017-05-26 NOTE — H&P (Signed)
Chief Complaint: Chief Complaint  Patient presents with  . Follow-up Studies  ECHO AND LEXISCAN  Date of Service: 05/18/2017 Date of Birth: 09-Nov-1930 PCP: [facility] [place]  History of Present Illness: Rose [name] is a 83 y.o.female patient who presents for evaluation recently had echocardiogram Myoview because of shortness of breath dyspnea anginal symptoms Myoview showed no evidence of ischemia with preserved left ventricular function echocardiogram however showed evidence of severe mitral stenosis. Patient states reasonably controlled blood pressure she has mild hypoxemia requiring inhalers and supplemental oxygen as necessary complains of dyspnea on exertion. Patient still complains of some recent mild chest discomfort.. With worsening dyspnea shortness of breath patient was being referred for TEE with Dr Lady Gary.  Past Medical and Surgical History  Past Medical History Past Medical History:  Diagnosis Date  . Allergic rhinitis  postnasal dripping  . Angina pectoris (CMS-HCC)  . CAD (coronary artery disease)  Abnormal cath with inferior apical defect.  . CHF (congestive heart failure) (CMS-HCC)  . Cough due to ACE inhibitor  . Diverticulitis of large intestine without perforation or abscess without bleeding 04/16/2015  . Esophageal thickening 04/16/2015  . GERD (gastroesophageal reflux disease)  . Headache(784.0)  . Heart murmur, unspecified  . Hiatal hernia  . History of PSVT (paroxysmal supraventricular tachycardia)  s/p ablation 2010 - followed by Dr. Juliann Pares Cardiology  . HLD (hyperlipidemia), unspecified  . HTN (hypertension)  . Migraine  . Nephrolithiasis  . OA (osteoarthritis)  lumbar spine, knees, carpal tunnel  . Obesity, unspecified  . OSA (obstructive sleep apnea)  . PVD (peripheral vascular disease) (CMS-HCC)  . TIA (transient ischemic attack)   Past Surgical History She has a past surgical history that includes Tonsillectomy; Hysterectomy; Knee  arthroscopy; back surgery; Joint replacement (Bilateral); Cardiac catheterization; Kidney stone removal; Renal artery stent; Oblation for atrial fibrillation; egd (08/07/2006); Cardiac catheterization; Colonoscopy (09/25/2006); and egd (05/22/2015).   Medications and Allergies  Current Medications  Current Outpatient Prescriptions  Medication Sig Dispense Refill  . albuterol 90 mcg/actuation inhaler Inhale 2 inhalations into the lungs every 6 (six) hours as needed for Wheezing. 1 Inhaler 5  . aspirin 81 MG EC tablet Take 81 mg by mouth.  Marland Kitchen atenolol (TENORMIN) 50 MG tablet Take 50 mg by mouth 2 (two) times daily.  Marland Kitchen atorvastatin (LIPITOR) 10 MG tablet Take 10 mg by mouth once daily. 1  . cholecalciferol (VITAMIN D3) 1,000 unit capsule Take 1,000 Units by mouth once daily.  Marland Kitchen docusate (COLACE) 100 MG capsule Take by mouth.  . FUROsemide (LASIX) 40 MG tablet Take 40 mg by mouth once daily. 3  . garlic 1,000 mg Cap Take 1 tablet by mouth once daily.  . hydrALAZINE (APRESOLINE) 10 MG tablet Take 1 tablet (10 mg total) by mouth 2 (two) times daily. 60 tablet 5  . hydrochlorothiazide (HYDRODIURIL) 25 MG tablet Take 25 mg by mouth once daily.  . isosorbide mononitrate (IMDUR) 30 MG ER tablet Take 1 tablet (30 mg total) by mouth once daily. 30 tablet 5  . lisinopril (PRINIVIL,ZESTRIL) 5 MG tablet Take 5 mg by mouth once daily. 1  . losartan (COZAAR) 25 MG tablet Take 25 mg by mouth once daily. 2  . metoprolol succinate (TOPROL-XL) 25 MG XL tablet Take 25 mg by mouth once daily. 1  . multivitamin tablet Take 1 tablet by mouth daily.  Marland Kitchen omeprazole (PRILOSEC) 20 MG DR capsule Take 20 mg by mouth Daily.  . OXYGEN-AIR DELIVERY SYSTEMS MISC Use.  . potassium  99 mg Tab Take by mouth.  . potassium gluconate 2.5 mEq Tab Take by mouth.  . prazosin (MINIPRESS) 1 MG capsule Take 1 mg by mouth nightly.  Marland Kitchen. RANEXA 500 mg ER tablet Take 500 mg by mouth 2 (two) times daily. 3  . ranitidine (ZANTAC) 150 MG tablet  TAKE ONE TABLET BY MOUTH TWICE DAILY HEARTBURN 5  . tiotropium (SPIRIVA) 18 mcg inhalation capsule Place 1 capsule (18 mcg total) into inhaler and inhale once daily. 30 capsule 5   No current facility-administered medications for this visit.   Allergies: Avapro [irbesartan]; Lisinopril; Plavix [clopidogrel]; Pravastatin; and Ultram [tramadol]  Social and Family History  Social History reports that she has never smoked. She has never used smokeless tobacco. She reports that she does not drink alcohol or use drugs.  Family History Family History  Problem Relation Age of Onset  . Diabetes type II Father  . High blood pressure (Hypertension) Father  . High blood pressure (Hypertension) Mother  . Diabetes type II Sister  . Diabetes type II Brother  . Stroke Brother  . Stroke Brother   Review of Systems   Review of Systems: The patient denies chest pain, shortness of breath, orthopnea, paroxysmal nocturnal dyspnea, pedal edema, palpitations, heart racing, presyncope, syncope. Review of 12 Systems is negative except as described above.  Physical Examination   Vitals:BP 133/70  Pulse 84  Ht 157.5 cm (5\' 2" )  Wt 88.9 kg (196 lb)  SpO2 96%  BMI 35.85 kg/m  Ht:157.5 cm (5\' 2" ) Wt:88.9 kg (196 lb) BMW:UXLKBSA:Body surface area is 1.97 meters squared. Body mass index is 35.85 kg/m.  HEENT: Pupils equally reactive to light and accomodation  Neck: Supple without thyromegaly, carotid pulses 2+ Lungs: clear to auscultation bilaterally; no wheezes, rales, rhonchi Heart: Regular rate and rhythm. No gallops, 2/6 sem murmurs or rub Abdomen: soft nontender, nondistended, with normal bowel sounds Extremities: no cyanosis, clubbing, or edema Peripheral Pulses: 2+ in all extremities, 2+ femoral pulses bilaterally  Assessment   83 y.o. female with  1. Non-rheumatic mitral valve stenosis  2. Angina at rest (CMS-HCC)  3. Murmur, unspecified  4. SOB (shortness of breath)  5. OSA (obstructive sleep  apnea)  6. Hyperlipidemia, unspecified hyperlipidemia type  7. Gastroesophageal reflux disease, esophagitis presence not specified  8. Mild obesity, unspecified  9. History of TIA (transient ischemic attack)  10. Chronic fatigue, unspecified  11. CRI (chronic renal insufficiency), stage 3 (moderate)  12. Hypoxemia  13. Chronic diastolic congestive heart failure (CMS-HCC)   Plan  1 mitral stenosis on transthoracic echocardiogram severe would recommend TEE for further assessment and evaluation with Dr Lady GaryFath as outpatient 2 hypertension reasonably controlled continue atenolol hydralazine HCTZ lisinopril losartan 3 GERD chronic stable continue omeprazole for reflux symptoms 4 obstructive sleep apnea chronic stable recommend sleep study CPAP weight loss 5 TIA by history for recovery recommend aspirin blood pressure control statin 6 hyperlipidemia chronic stable continue Lipitor therapy for lipid management 7 congestive heart failure diastolic dysfunction continue aggressive blood pressure control beta-blocker ACE inhibitor ARB and Lasix 8 hypoxemia mild intermittent continue supplemental oxygen as necessary 9 chronic intermittent unclear etiology follow-up with primary physician 10 lower extremities continue diuretic therapy 11 SVT by history status [facility] table 12 have the patient follow-up in 3 months  No Follow-up on file.  Rose Salome Arnt CALLWOOD, MD     Pt seen and examined. No change from above.

## 2017-05-26 NOTE — Procedures (Signed)
   TRANSESOPHAGEAL ECHOCARDIOGRAM   NAME:  Rose ResMaggie G Atallah   MRN: [id number] DOB:  07/18/1930   ADMIT DATE: 05/26/2017  INDICATIONS: Mitral stenosis  PROCEDURE:   Informed consent was obtained prior to the procedure. The risks, benefits and alternatives for the procedure were discussed and the patient comprehended these risks.  Risks include, but are not limited to, cough, sore throat, vomiting, nausea, somnolence, esophageal and stomach trauma or perforation, bleeding, low blood pressure, aspiration, pneumonia, infection, trauma to the teeth and death.    After a procedural time-out, the patient was given 2 mg versed and 50 mcg fentanyl for moderate sedation.  The oropharynx was anesthetized 1 cc of topical 1% viscous lidocaine.  The transesophageal probe was inserted in the esophagus and stomach without difficulty and multiple views were obtained.    COMPLICATIONS:    There were no immediate complications.   Preliminary FINDINGS:  LEFT VENTRICLE: EF = 55-65%. No regional wall motion abnormalities.  RIGHT VENTRICLE: Normal size and function.   LEFT ATRIUM: spontaneous contrast noted. No thrombus noted. Left atrium mildly dilated  LEFT ATRIAL APPENDAGE: No thrombus.   RIGHT ATRIUM:  Upper normal size.   AORTIC VALVE:  Trileaflet. Mild thickening. Trivial ai  MITRAL VALVE:    Heavily calcified with reduced motility. Severe stenosis. Mild MR  TRICUSPID VALVE: Normal. Mild to moderate TR  PULMONIC VALVE: Grossly normal.  INTERATRIAL SEPTUM: No PFO or ASD. Agitated saline contrast showed no right to left shunt  PERICARDIUM: No effusion  DESCENDING AORTA: No significant abnormality noted  CONCLUSION:  Normal lv function Severe mitral stenosis Slcerotoic aortic valve. Full note to follow.

## 2017-05-26 NOTE — Progress Notes (Signed)
*  PRELIMINARY RESULTS* Echocardiogram Echocardiogram Transesophageal has been performed.  Cristela BlueHege, Burgundy Matuszak 05/26/2017, 7:56 AM

## 2017-05-28 ENCOUNTER — Encounter: Payer: Self-pay | Admitting: Internal Medicine

## 2017-08-04 ENCOUNTER — Encounter (INDEPENDENT_AMBULATORY_CARE_PROVIDER_SITE_OTHER): Payer: Self-pay

## 2017-08-04 ENCOUNTER — Ambulatory Visit (INDEPENDENT_AMBULATORY_CARE_PROVIDER_SITE_OTHER): Payer: Self-pay | Admitting: Vascular Surgery

## 2017-08-08 ENCOUNTER — Ambulatory Visit: Payer: Medicare HMO | Admitting: Family

## 2017-08-16 ENCOUNTER — Ambulatory Visit: Payer: Medicare HMO | Admitting: Family

## 2017-08-29 ENCOUNTER — Encounter: Payer: Self-pay | Admitting: Family

## 2017-08-29 ENCOUNTER — Ambulatory Visit: Payer: Medicare HMO | Attending: Family | Admitting: Family

## 2017-08-29 VITALS — BP 129/89 | HR 78 | Resp 18 | Ht 64.0 in | Wt 189.4 lb

## 2017-08-29 DIAGNOSIS — E785 Hyperlipidemia, unspecified: Secondary | ICD-10-CM | POA: Insufficient documentation

## 2017-08-29 DIAGNOSIS — G5600 Carpal tunnel syndrome, unspecified upper limb: Secondary | ICD-10-CM | POA: Diagnosis not present

## 2017-08-29 DIAGNOSIS — I5032 Chronic diastolic (congestive) heart failure: Secondary | ICD-10-CM | POA: Diagnosis not present

## 2017-08-29 DIAGNOSIS — Z833 Family history of diabetes mellitus: Secondary | ICD-10-CM | POA: Diagnosis not present

## 2017-08-29 DIAGNOSIS — M1990 Unspecified osteoarthritis, unspecified site: Secondary | ICD-10-CM | POA: Insufficient documentation

## 2017-08-29 DIAGNOSIS — K219 Gastro-esophageal reflux disease without esophagitis: Secondary | ICD-10-CM | POA: Insufficient documentation

## 2017-08-29 DIAGNOSIS — Z9889 Other specified postprocedural states: Secondary | ICD-10-CM | POA: Insufficient documentation

## 2017-08-29 DIAGNOSIS — R42 Dizziness and giddiness: Secondary | ICD-10-CM | POA: Diagnosis not present

## 2017-08-29 DIAGNOSIS — I110 Hypertensive heart disease with heart failure: Secondary | ICD-10-CM | POA: Diagnosis not present

## 2017-08-29 DIAGNOSIS — I080 Rheumatic disorders of both mitral and aortic valves: Secondary | ICD-10-CM | POA: Diagnosis not present

## 2017-08-29 DIAGNOSIS — G4733 Obstructive sleep apnea (adult) (pediatric): Secondary | ICD-10-CM | POA: Insufficient documentation

## 2017-08-29 DIAGNOSIS — Z96653 Presence of artificial knee joint, bilateral: Secondary | ICD-10-CM | POA: Insufficient documentation

## 2017-08-29 DIAGNOSIS — D649 Anemia, unspecified: Secondary | ICD-10-CM | POA: Insufficient documentation

## 2017-08-29 DIAGNOSIS — Z7982 Long term (current) use of aspirin: Secondary | ICD-10-CM | POA: Insufficient documentation

## 2017-08-29 DIAGNOSIS — Z79899 Other long term (current) drug therapy: Secondary | ICD-10-CM | POA: Diagnosis not present

## 2017-08-29 DIAGNOSIS — Z8673 Personal history of transient ischemic attack (TIA), and cerebral infarction without residual deficits: Secondary | ICD-10-CM | POA: Insufficient documentation

## 2017-08-29 DIAGNOSIS — N289 Disorder of kidney and ureter, unspecified: Secondary | ICD-10-CM | POA: Insufficient documentation

## 2017-08-29 DIAGNOSIS — R0609 Other forms of dyspnea: Secondary | ICD-10-CM | POA: Insufficient documentation

## 2017-08-29 DIAGNOSIS — I10 Essential (primary) hypertension: Secondary | ICD-10-CM

## 2017-08-29 DIAGNOSIS — Z8249 Family history of ischemic heart disease and other diseases of the circulatory system: Secondary | ICD-10-CM | POA: Diagnosis not present

## 2017-08-29 DIAGNOSIS — Z888 Allergy status to other drugs, medicaments and biological substances status: Secondary | ICD-10-CM | POA: Insufficient documentation

## 2017-08-29 DIAGNOSIS — R0602 Shortness of breath: Secondary | ICD-10-CM | POA: Diagnosis not present

## 2017-08-29 NOTE — Progress Notes (Signed)
Patient ID: Rose [name], female    DOB: 1930-09-10, 83 y.o.   MRN: [id number]  HPI  Rose [name] is a 83 y/o female with a history of hyperlipidemia, murmur, HTN, renal insufficiency, GERD, obstructive sleep apnea, TIA, anemia, arrhythmia s/p ablation and chronic heart failure.  TEE was done 05/26/17. Last echo was done on 06/20/16 and showed an EF of 65-70% with mild/moderate MR, mild TR and mild/moderate aortic stenosis. This is essentially unchanged from her previous echo done on 10/14/15.  In the ED 03/11/17 due to chest pain. Labs/EKG were normal and she was released home. Last admitted on 06/20/16 with chest pain and acute on chronic heart failure exacerbation. She was treated with IV diuretics and had a cardiology consult done. She was discharged the following day.  She presents today for her follow-up visit with a chief complaint of moderate shortness of breath with minimal exertion. Denies shortness of breath at rest. She says her shortness of breath has been present for years with varying levels of severity. She does feel like her shortness of breath may be a little worse as she has a nebulizer machine that she doesn't know how to use. She has associated fatigue and dizziness along with this. She denies any chest pain, edema, wheezing or weight gain.   Past Medical History:  Diagnosis Date  . Allergic rhinitis   . Anemia   . Arrhythmia    s/p ablation for A-fib  . Carpal tunnel syndrome   . GERD (gastroesophageal reflux disease)   . Heart murmur   . Hyperlipidemia   . Hypertension   . Lumbar spine pain   . Nephrolithiasis   . Osteoarthritis   . Sleep apnea   . TIA (transient ischemic attack)    Past Surgical History:  Procedure Laterality Date  . ABDOMINAL HYSTERECTOMY    . ablation  January 2011   A-fib  . BACK SURGERY    . CARDIAC CATHETERIZATION    . ESOPHAGOGASTRODUODENOSCOPY N/A 05/22/2015   Procedure: ESOPHAGOGASTRODUODENOSCOPY (EGD);  Surgeon: Scot Jun, MD;   Location: [facility] ENDOSCOPY;  Service: Endoscopy;  Laterality: N/A;  . RENAL ARTERY STENT    . REPLACEMENT TOTAL KNEE BILATERAL    . SAVORY DILATION N/A 05/22/2015   Procedure: SAVORY DILATION;  Surgeon: Scot Jun, MD;  Location: [facility] ENDOSCOPY;  Service: Endoscopy;  Laterality: N/A;  . TEE WITHOUT CARDIOVERSION N/A 05/26/2017   Procedure: TRANSESOPHAGEAL ECHOCARDIOGRAM (TEE);  Surgeon: Alwyn Pea, MD;  Location: [facility] ORS;  Service: Cardiovascular;  Laterality: N/A;  . TONSILLECTOMY     Family History  Problem Relation Age of Onset  . Diabetes Father   . Hypertension Mother    Social History  Substance Use Topics  . Smoking status: Never Smoker  . Smokeless tobacco: Never Used  . Alcohol use No   Allergies  Allergen Reactions  . Celecoxib Other (See Comments)    Unknown.  Marland Kitchen Avapro [Irbesartan] Other (See Comments)    Unknown  . Lisinopril Other (See Comments)    Unknown  . Plavix [Clopidogrel Bisulfate] Other (See Comments)    Unknown  . Statins Other (See Comments)    Unknown  . Ultram [Tramadol Hcl] Other (See Comments)    Unknown   Prior to Admission medications   Medication Sig Start Date End Date Taking? Authorizing Provider  acetaminophen (TYLENOL) 500 MG tablet Take 500-1,000 mg by mouth every 6 (six) hours as needed (pain).   Yes [provider]  albuterol (  PROVENTIL HFA;VENTOLIN HFA) 108 (90 BASE) MCG/ACT inhaler Inhale 2 puffs into the lungs every 6 (six) hours as needed for wheezing or shortness of breath.   Yes [provider]  aspirin EC 81 MG tablet Take 81 mg by mouth 3 (three) times a week.    Yes [provider]  atorvastatin (LIPITOR) 10 MG tablet Take 10 mg by mouth at bedtime.    Yes [provider]  budesonide-formoterol (SYMBICORT) 80-4.5 MCG/ACT inhaler Inhale 2 puffs into the lungs 2 (two) times daily.   Yes [provider]  docusate sodium (COLACE) 100 MG capsule Take 100 mg by mouth 2 (two) times  daily.   Yes [provider]  furosemide (LASIX) 40 MG tablet Take 1 tablet (40 mg total) by mouth daily. 10/15/15  Yes Wieting, Richard, MD  hydrALAZINE (APRESOLINE) 10 MG tablet Take 1 tablet (10 mg total) by mouth 2 (two) times daily. 10/15/15  Yes Wieting, Richard, MD  isosorbide mononitrate (IMDUR) 30 MG 24 hr tablet Take 30 mg by mouth daily at 12 noon.  03/15/17 03/15/18 Yes [provider]  losartan (COZAAR) 25 MG tablet Take 25 mg by mouth daily.   Yes [provider]  Magnesium 250 MG TABS Take 250 mg by mouth daily.    Yes [provider]  Melatonin 5 MG CAPS Take 10 mg by mouth at bedtime.    Yes [provider]  metoprolol succinate (TOPROL-XL) 25 MG 24 hr tablet Take 25 mg by mouth at bedtime.    Yes [provider]  Multiple Vitamin (MULTIVITAMIN) tablet Take 1 tablet by mouth daily.   Yes [provider]  OXYGEN Use.   Yes [provider]  ranitidine (ZANTAC) 150 MG tablet TAKE ONE TABLET BY MOUTH TWICE DAILY HEARTBURN 07/14/16  Yes [provider]  ranolazine (RANEXA) 500 MG 12 hr tablet Take 500 mg by mouth 2 (two) times daily.   Yes [provider]  tiotropium (SPIRIVA) 18 MCG inhalation capsule Place 1 capsule into inhaler and inhale daily. 03/08/17 03/08/18 Yes [provider]    Review of Systems  Constitutional: Positive for appetite change (decreased) and fatigue.  HENT: Negative for congestion, postnasal drip and sore throat.   Eyes: Negative.   Respiratory: Positive for cough, shortness of breath and wheezing. Negative for chest tightness.   Cardiovascular: Negative for chest pain, palpitations and leg swelling.  Gastrointestinal: Negative for abdominal distention and abdominal pain.  Endocrine: Negative.   Genitourinary: Negative.   Musculoskeletal: Negative for back pain and neck pain.  Skin: Negative.   Allergic/Immunologic: Negative.   Neurological: Positive for  dizziness. Negative for light-headedness.  Hematological: Negative for adenopathy. Does not bruise/bleed easily.  Psychiatric/Behavioral: Negative for dysphoric mood, sleep disturbance and suicidal ideas. The patient is not nervous/anxious.     Physical Exam  Constitutional: She is oriented to person, place, and time. She appears well-developed and well-nourished.  HENT:  Head: Normocephalic and atraumatic.  Neck: Normal range of motion. Neck supple. No JVD present.  Cardiovascular: Normal rate and regular rhythm.   Pulmonary/Chest: Effort normal. She has no wheezes. She has no rhonchi. She has no rales.  Abdominal: Soft. She exhibits no distension. There is no tenderness.  Musculoskeletal: She exhibits edema (trace edema in bilateral ankles). She exhibits no tenderness.  Neurological: She is alert and oriented to person, place, and time.  Skin: Skin is warm and dry.  Psychiatric: She has a normal mood and affect. Her behavior is  normal. Thought content normal.  Nursing note and vitals reviewed.  Vitals:   08/29/17 0946  BP: 129/89  Pulse: 78  Resp: 18  SpO2: 97%  Weight: 189 lb 6 oz (85.9 kg)  Height:  (1.626 m)   Wt Readings from Last 3 Encounters:  08/29/17 189 lb 6 oz (85.9 kg)  05/26/17 194 lb (88 kg)  05/09/17 194 lb 6 oz (88.2 kg)    Lab Results  Component Value Date   CREATININE 1.76 (H) 03/11/2017   CREATININE 1.54 (H) 08/17/2016   CREATININE 1.81 (H) 06/21/2016      Assessment & Plan:  1: Chronic heart failure with preserved ejection fraction- - NYHA class III - euvolemic - not weighing daily. She was instructed to resume weighing daily and to call for an overnight weight gain of >2 pounds or a weekly weight gain of >5 pounds - weight down 5 pounds by our scale - says that she's adding some salt to her food and she was encouraged to not add any salt - does wear oxygen at 3L as needed and at bedtime - saw cardiologist Juliann Pares) 07/11/17 - PharmD  reviewed medications with the patient. He also took patient to cardiopulmonary where they reviewed how to use her nebulizer - elevate legs when sitting for long periods of time - severe mitral stenosis and deemed a high risk surgical candidate - patient reports receiving her flu vaccine for this season already  2: HTN- - BP looks good today - Tolerating toprol XL She said that she sometimes has side effects from generic medications. - saw PCP Allena Katz) 08/08/17  3: Dyspnea on exertion- - has a nebulizer but hasn't been using it as she didn't know how to use it - cardiopulmonary reviewed usage with her today  Patient did not bring her medications nor a list. Each medication was verbally reviewed with the patient and she was encouraged to bring the bottles to every visit to confirm accuracy of list. When list was reviewed, patient just said she was taking the medication and I'm not entirely sure she knows what she's taking  Return in 3 months or sooner for any questions/problems before then.

## 2017-08-29 NOTE — Patient Instructions (Signed)
Begin weighing daily and call for an overnight weight gain of > 2 pounds or a weekly weight gain of >5 pounds. 

## 2017-09-05 ENCOUNTER — Encounter (INDEPENDENT_AMBULATORY_CARE_PROVIDER_SITE_OTHER): Payer: Self-pay

## 2017-09-05 ENCOUNTER — Ambulatory Visit (INDEPENDENT_AMBULATORY_CARE_PROVIDER_SITE_OTHER): Payer: Self-pay | Admitting: Vascular Surgery

## 2017-10-11 ENCOUNTER — Encounter (INDEPENDENT_AMBULATORY_CARE_PROVIDER_SITE_OTHER): Payer: Medicare HMO

## 2017-10-11 ENCOUNTER — Ambulatory Visit (INDEPENDENT_AMBULATORY_CARE_PROVIDER_SITE_OTHER): Payer: Medicare HMO | Admitting: Vascular Surgery

## 2017-10-25 ENCOUNTER — Encounter (INDEPENDENT_AMBULATORY_CARE_PROVIDER_SITE_OTHER): Payer: Medicare HMO

## 2017-10-25 ENCOUNTER — Ambulatory Visit (INDEPENDENT_AMBULATORY_CARE_PROVIDER_SITE_OTHER): Payer: Medicare HMO | Admitting: Vascular Surgery

## 2017-11-29 ENCOUNTER — Ambulatory Visit: Payer: Medicare HMO | Admitting: Family

## 2017-12-06 ENCOUNTER — Other Ambulatory Visit (INDEPENDENT_AMBULATORY_CARE_PROVIDER_SITE_OTHER): Payer: Self-pay | Admitting: Vascular Surgery

## 2017-12-06 ENCOUNTER — Ambulatory Visit (INDEPENDENT_AMBULATORY_CARE_PROVIDER_SITE_OTHER): Payer: Self-pay

## 2017-12-06 ENCOUNTER — Encounter (INDEPENDENT_AMBULATORY_CARE_PROVIDER_SITE_OTHER): Payer: Self-pay | Admitting: Vascular Surgery

## 2017-12-06 ENCOUNTER — Ambulatory Visit (INDEPENDENT_AMBULATORY_CARE_PROVIDER_SITE_OTHER): Payer: Self-pay | Admitting: Vascular Surgery

## 2017-12-06 VITALS — BP 130/75 | HR 81 | Resp 16 | Wt 182.8 lb

## 2017-12-06 DIAGNOSIS — I739 Peripheral vascular disease, unspecified: Secondary | ICD-10-CM

## 2017-12-06 DIAGNOSIS — I701 Atherosclerosis of renal artery: Secondary | ICD-10-CM

## 2017-12-06 DIAGNOSIS — I6523 Occlusion and stenosis of bilateral carotid arteries: Secondary | ICD-10-CM

## 2017-12-06 NOTE — Progress Notes (Signed)
Subjective:    Patient ID: Rose [name], female    DOB: 09/22/1930, 84 y.o.   MRN: [id number] Chief Complaint  Patient presents with  . Follow-up    [age]yr renal,carotid   The patient ends for yearly carotid and renal artery stenosis follow-up. The stenosis has been followed by surveillance duplexes. The patient underwent a bilateral carotid duplex scan which showed no change from the previous exam on 08/02/16. Duplex is stable at Right ICA stenosis (40-59%) and Left ICA stenosis (1-39%).  Right ECA with greater than 50% stenosis.  Bilateral vertebral arteries are patent with antegrade flow. Normal flow hemodynamics were seen in the bilateral subclavian artery.  The patient denies experiencing Amaurosis Fugax, TIA like symptoms or focal motor deficits.  The patient also underwent a renal artery duplex which was notable for no evidence of renal artery stenosis.  Patent right renal artery stent.  No evidence of left renal artery stenosis.  Bilateral normal kidney size.  Bilateral renal vein flow is present.  This is no change when compared to the previous exam on August 02, 2016.  The patient denies any issues with hypertension or changes in her kidney function.  The patient denies any fever, nausea vomiting.   Review of Systems  Constitutional: Negative.   HENT: Negative.   Eyes: Negative.   Respiratory: Negative.   Cardiovascular: Negative.   Gastrointestinal: Negative.   Endocrine: Negative.   Genitourinary: Negative.   Musculoskeletal: Negative.   Skin: Negative.   Allergic/Immunologic: Negative.   Neurological: Negative.   Hematological: Negative.   Psychiatric/Behavioral: Negative.       Objective:   Physical Exam  Constitutional: She is oriented to person, place, and time. She appears well-developed and well-nourished. No distress.  HENT:  Head: Normocephalic and atraumatic.  Eyes: Conjunctivae are normal. Pupils are equal, round, and reactive to light.  Neck: Normal range  of motion.  No carotid bruits noted  Cardiovascular: Normal rate, regular rhythm, normal heart sounds and intact distal pulses.  Pulses:      Radial pulses are 2+ on the right side, and 2+ on the left side.  Pulmonary/Chest: Effort normal and breath sounds normal. No respiratory distress. She has no wheezes.  Abdominal: Soft. Bowel sounds are normal. She exhibits no distension. There is no tenderness.  Musculoskeletal: Normal range of motion. She exhibits no edema.  Neurological: She is alert and oriented to person, place, and time.  Skin: Skin is warm and dry. She is not diaphoretic.  Psychiatric: She has a normal mood and affect. Her behavior is normal. Judgment and thought content normal.  Vitals reviewed.  BP 130/75 (BP Location: Left Arm)   Pulse 81   Resp 16   Wt 182 lb 12.8 oz (82.9 kg)   BMI 31.38 kg/m   Past Medical History:  Diagnosis Date  . Allergic rhinitis   . Anemia   . Arrhythmia    s/p ablation for A-fib  . Carpal tunnel syndrome   . GERD (gastroesophageal reflux disease)   . Heart murmur   . Hyperlipidemia   . Hypertension   . Lumbar spine pain   . Nephrolithiasis   . Osteoarthritis   . Sleep apnea   . TIA (transient ischemic attack)    Social History   Socioeconomic History  . Marital status: Widowed    Spouse name: Not on file  . Number of children: Not on file  . Years of education: Not on file  . Highest education level: Not  on file  Social Needs  . Financial resource strain: Not on file  . Food insecurity - worry: Not on file  . Food insecurity - inability: Not on file  . Transportation needs - medical: Not on file  . Transportation needs - non-medical: Not on file  Occupational History  . Not on file  Tobacco Use  . Smoking status: Never Smoker  . Smokeless tobacco: Never Used  Substance and Sexual Activity  . Alcohol use: No  . Drug use: No  . Sexual activity: Not on file  Other Topics Concern  . Not on file  Social History  Narrative  . Not on file   Past Surgical History:  Procedure Laterality Date  . ABDOMINAL HYSTERECTOMY    . ablation  January 2011   A-fib  . BACK SURGERY    . CARDIAC CATHETERIZATION    . ESOPHAGOGASTRODUODENOSCOPY N/A 05/22/2015   Procedure: ESOPHAGOGASTRODUODENOSCOPY (EGD);  Surgeon: Scot Jun, MD;  Location: [facility] ENDOSCOPY;  Service: Endoscopy;  Laterality: N/A;  . RENAL ARTERY STENT    . REPLACEMENT TOTAL KNEE BILATERAL    . SAVORY DILATION N/A 05/22/2015   Procedure: SAVORY DILATION;  Surgeon: Scot Jun, MD;  Location: [facility] ENDOSCOPY;  Service: Endoscopy;  Laterality: N/A;  . TEE WITHOUT CARDIOVERSION N/A 05/26/2017   Procedure: TRANSESOPHAGEAL ECHOCARDIOGRAM (TEE);  Surgeon: Alwyn Pea, MD;  Location: [facility] ORS;  Service: Cardiovascular;  Laterality: N/A;  . TONSILLECTOMY     Family History  Problem Relation Age of Onset  . Diabetes Father   . Hypertension Mother    Allergies  Allergen Reactions  . Celecoxib Other (See Comments)    Unknown.  Marland Kitchen Avapro [Irbesartan] Other (See Comments)    Unknown  . Lisinopril Other (See Comments)    Unknown  . Plavix [Clopidogrel Bisulfate] Other (See Comments)    Unknown  . Statins Other (See Comments)    Unknown  . Ultram [Tramadol Hcl] Other (See Comments)    Unknown      Assessment & Plan:  The patient ends for yearly carotid and renal artery stenosis follow-up. The stenosis has been followed by surveillance duplexes. The patient underwent a bilateral carotid duplex scan which showed no change from the previous exam on 08/02/16. Duplex is stable at Right ICA stenosis (40-59%) and Left ICA stenosis (1-39%).  Right ECA with greater than 50% stenosis.  Bilateral vertebral arteries are patent with antegrade flow. Normal flow hemodynamics were seen in the bilateral subclavian artery.  The patient denies experiencing Amaurosis Fugax, TIA like symptoms or focal motor deficits.  The patient also underwent a renal artery  duplex which was notable for no evidence of renal artery stenosis.  Patent right renal artery stent.  No evidence of left renal artery stenosis.  Bilateral normal kidney size.  Bilateral renal vein flow is present.  This is no change when compared to the previous exam on August 02, 2016.  The patient denies any issues with hypertension or changes in her kidney function.  The patient denies any fever, nausea vomiting.  1. Bilateral carotid artery stenosis - Stable Studies reviewed with patient. Patient asymptomatic with stable duplex.  No intervention at this time.  Patient to return in one year for surveillance carotid duplex. Patient to remain abstinent of tobacco use. I have discussed with the patient at length the risk factors for and pathogenesis of atherosclerotic disease and encouraged a healthy diet, regular exercise regimen and blood pressure / glucose control.  Patient was  instructed to contact our office in the interim with problems such as arm / leg weakness or numbness, speech / swallowing difficulty or temporary monocular blindness. The patient expresses their understanding.   - VAS US CAROTID; Future  2. Renal artery stenosis (HCC) - Stable Studies reviewed. Asymptomatic, physical exam unremarkable, duplex stable We will continue to surveil with a renal duplex and will see the patient back in one year. Patient to remain abstinent of tobacco use. I have discussed with the patient at length the risk factors for and pathogenesis of atherosclerotic disease and encouraged a healthy diet, regular exercise regimen and blood pressure / glucose control.  Patient was instructed to contact our office in the interim with problems such as increasing / uncontrollable hypertension or changes in kidney function. The patient expresses their understanding.  - VAS US RENAL ARTERY DUPLEX; Future  3. PVD (peripheral vascular disease) (HCC) - Stable Patient presents asymptomatically at this  time. If the patient should start to experience any claudication-like symptoms, rest pain or ulceration to the lower extremity she is to call the office sooner than her scheduled yearly appointment The patient expresses her understanding  Current Outpatient Medications on File Prior to Visit  Medication Sig Dispense Refill  . acetaminophen (TYLENOL) 500 MG tablet Take 500-1,000 mg by mouth every 6 (six) hours as needed (pain).    Marland Kitchen albuterol (PROVENTIL HFA;VENTOLIN HFA) 108 (90 BASE) MCG/ACT inhaler Inhale 2 puffs into the lungs every 6 (six) hours as needed for wheezing or shortness of breath.    Marland Kitchen aspirin EC 81 MG tablet Take 81 mg by mouth 3 (three) times a week.     Marland Kitchen atorvastatin (LIPITOR) 10 MG tablet Take 10 mg by mouth at bedtime.     . budesonide-formoterol (SYMBICORT) 80-4.5 MCG/ACT inhaler Inhale 2 puffs into the lungs 2 (two) times daily.    Marland Kitchen docusate sodium (COLACE) 100 MG capsule Take 100 mg by mouth 2 (two) times daily.    . furosemide (LASIX) 40 MG tablet Take 1 tablet (40 mg total) by mouth daily. 30 tablet 0  . hydrALAZINE (APRESOLINE) 10 MG tablet Take 1 tablet (10 mg total) by mouth 2 (two) times daily. 60 tablet 0  . isosorbide mononitrate (IMDUR) 30 MG 24 hr tablet Take 30 mg by mouth daily at 12 noon.     Marland Kitchen losartan (COZAAR) 25 MG tablet Take 25 mg by mouth daily.    . Magnesium 250 MG TABS Take 250 mg by mouth daily.     . Melatonin 5 MG CAPS Take 10 mg by mouth at bedtime.     . metoprolol succinate (TOPROL-XL) 25 MG 24 hr tablet Take 25 mg by mouth at bedtime.     . Multiple Vitamin (MULTIVITAMIN) tablet Take 1 tablet by mouth daily.    . OXYGEN Use.    . ranitidine (ZANTAC) 150 MG tablet TAKE ONE TABLET BY MOUTH TWICE DAILY HEARTBURN  5  . ranolazine (RANEXA) 500 MG 12 hr tablet Take 500 mg by mouth 2 (two) times daily.    Marland Kitchen tiotropium (SPIRIVA) 18 MCG inhalation capsule Place 1 capsule into inhaler and inhale daily.     No current facility-administered  medications on file prior to visit.    There are no Patient Instructions on file for this visit. No Follow-up on file.  Brandom Kerwin A Whitnee Orzel, PA-C

## 2017-12-15 NOTE — Progress Notes (Signed)
Patient ID: Rose [name], female    DOB: 10-16-1930, 84 y.o.   MRN: [id number]  HPI  Rose [name] is a 84 y/o female with a history of hyperlipidemia, murmur, HTN, renal insufficiency, GERD, obstructive sleep apnea, TIA, anemia, arrhythmia s/p ablation and chronic heart failure.  TEE was done 05/26/17. Last echo was done on 06/20/16 and showed an EF of 65-70% with mild/moderate MR, mild TR and mild/moderate aortic stenosis. This is essentially unchanged from her previous echo done on 10/14/15.  Has not been admitted or been in the ED in the last 6 months.   She presents today for her follow-up visit with a chief complaint of chronic fatigue upon minimal exertion. She describes this as being present for several years with varying levels of severity. She has associated cough, shortness of breath, occasional wheezing, chest pain and dizziness along with this. She denies any edema, palpitations, abdominal distention, weight gain or difficulty sleeping.   Past Medical History:  Diagnosis Date  . Allergic rhinitis   . Anemia   . Arrhythmia    s/p ablation for A-fib  . Carpal tunnel syndrome   . GERD (gastroesophageal reflux disease)   . Heart murmur   . Hyperlipidemia   . Hypertension   . Lumbar spine pain   . Nephrolithiasis   . Osteoarthritis   . Sleep apnea   . TIA (transient ischemic attack)    Past Surgical History:  Procedure Laterality Date  . ABDOMINAL HYSTERECTOMY    . ablation  January 2011   A-fib  . BACK SURGERY    . CARDIAC CATHETERIZATION    . ESOPHAGOGASTRODUODENOSCOPY N/A 05/22/2015   Procedure: ESOPHAGOGASTRODUODENOSCOPY (EGD);  Surgeon: Scot Jun, MD;  Location: [facility] ENDOSCOPY;  Service: Endoscopy;  Laterality: N/A;  . RENAL ARTERY STENT    . REPLACEMENT TOTAL KNEE BILATERAL    . SAVORY DILATION N/A 05/22/2015   Procedure: SAVORY DILATION;  Surgeon: Scot Jun, MD;  Location: [facility] ENDOSCOPY;  Service: Endoscopy;  Laterality: N/A;  . TEE WITHOUT  CARDIOVERSION N/A 05/26/2017   Procedure: TRANSESOPHAGEAL ECHOCARDIOGRAM (TEE);  Surgeon: Alwyn Pea, MD;  Location: [facility] ORS;  Service: Cardiovascular;  Laterality: N/A;  . TONSILLECTOMY     Family History  Problem Relation Age of Onset  . Diabetes Father   . Hypertension Mother    Social History   Tobacco Use  . Smoking status: Never Smoker  . Smokeless tobacco: Never Used  Substance Use Topics  . Alcohol use: No   Allergies  Allergen Reactions  . Celecoxib Other (See Comments)    Unknown.  Marland Kitchen Avapro [Irbesartan] Other (See Comments)    Unknown  . Lisinopril Other (See Comments)    Unknown  . Plavix [Clopidogrel Bisulfate] Other (See Comments)    Unknown  . Statins Other (See Comments)    Unknown  . Ultram [Tramadol Hcl] Other (See Comments)    Unknown   Prior to Admission medications   Medication Sig Start Date End Date Taking? Authorizing Provider  acetaminophen (TYLENOL) 500 MG tablet Take 500-1,000 mg by mouth every 6 (six) hours as needed (pain).   Yes [provider]  albuterol (PROVENTIL HFA;VENTOLIN HFA) 108 (90 BASE) MCG/ACT inhaler Inhale 2 puffs into the lungs every 6 (six) hours as needed for wheezing or shortness of breath.   Yes [provider]  aspirin EC 81 MG tablet Take 81 mg by mouth 3 (three) times a week.    Yes [provider]  atorvastatin (LIPITOR) 10 MG tablet Take 10 mg by mouth at bedtime.    Yes [provider]  budesonide-formoterol (SYMBICORT) 80-4.5 MCG/ACT inhaler Inhale 2 puffs into the lungs 2 (two) times daily.   Yes [provider]  docusate sodium (COLACE) 100 MG capsule Take 100 mg by mouth 2 (two) times daily.   Yes [provider]  furosemide (LASIX) 40 MG tablet Take 1 tablet (40 mg total) by mouth daily. 10/15/15  Yes Wieting, Richard, MD  hydrALAZINE (APRESOLINE) 10 MG tablet Take 1 tablet (10 mg total) by mouth 2 (two) times daily. 10/15/15  Yes Wieting, Richard, MD   isosorbide mononitrate (IMDUR) 30 MG 24 hr tablet Take 30 mg by mouth daily at 12 noon.  03/15/17 03/15/18 Yes [provider]  losartan (COZAAR) 25 MG tablet Take 25 mg by mouth 2 (two) times a week.    Yes [provider]  Magnesium 250 MG TABS Take 250 mg by mouth daily.    Yes [provider]  Melatonin 5 MG CAPS Take 10 mg by mouth at bedtime.    Yes [provider]  metoprolol succinate (TOPROL-XL) 25 MG 24 hr tablet Take 25 mg by mouth at bedtime.    Yes [provider]  Multiple Vitamin (MULTIVITAMIN) tablet Take 1 tablet by mouth daily.   Yes [provider]  OXYGEN Use.   Yes [provider]  ranitidine (ZANTAC) 150 MG tablet TAKE ONE TABLET BY MOUTH TWICE DAILY HEARTBURN 07/14/16  Yes [provider]  ranolazine (RANEXA) 500 MG 12 hr tablet Take 500 mg by mouth 2 (two) times daily.   Yes [provider]  tiotropium (SPIRIVA) 18 MCG inhalation capsule Place 1 capsule into inhaler and inhale daily. 03/08/17 03/08/18 Yes [provider]    Review of Systems  Constitutional: Positive for appetite change (decreased) and fatigue.  HENT: Negative for congestion, postnasal drip and sore throat.   Eyes: Negative.   Respiratory: Positive for cough, shortness of breath and wheezing. Negative for chest tightness.   Cardiovascular: Positive for chest pain (with heavy exertion). Negative for palpitations and leg swelling.  Gastrointestinal: Negative for abdominal distention and abdominal pain.  Endocrine: Negative.   Genitourinary: Negative.   Musculoskeletal: Negative for back pain and neck pain.  Skin: Negative.   Allergic/Immunologic: Negative.   Neurological: Positive for dizziness. Negative for light-headedness.  Hematological: Negative for adenopathy. Does not bruise/bleed easily.  Psychiatric/Behavioral: Negative for dysphoric mood, sleep disturbance (sleeping better) and suicidal ideas. The patient is  not nervous/anxious.    Vitals:   12/19/17 1026  BP: 138/60  Pulse: 87  Resp: 18  SpO2: 96%  Weight: 187 lb 4 oz (84.9 kg)  Height: 5\' 4"  (1.626 m)   Wt Readings from Last 3 Encounters:  12/19/17 187 lb 4 oz (84.9 kg)  12/06/17 182 lb 12.8 oz (82.9 kg)  08/29/17 189 lb 6 oz (85.9 kg)   Lab Results  Component Value Date   CREATININE 1.76 (H) 03/11/2017   CREATININE 1.54 (H) 08/17/2016   CREATININE 1.81 (H) 06/21/2016    Physical Exam  Constitutional: She is oriented to person, place, and time. She appears well-developed and well-nourished.  HENT:  Head: Normocephalic and atraumatic.  Neck: Normal range of motion. Neck supple. No JVD present.  Cardiovascular: Normal rate and regular rhythm.  Pulmonary/Chest: Effort normal. She has no wheezes. She has no rhonchi. She has no rales.  Abdominal: Soft. She exhibits no distension. There is no  tenderness.  Musculoskeletal: She exhibits no edema or tenderness.  Neurological: She is alert and oriented to person, place, and time.  Skin: Skin is warm and dry.  Psychiatric: She has a normal mood and affect. Her behavior is normal. Thought content normal.  Nursing note and vitals reviewed.     Assessment & Plan:  1: Chronic heart failure with preserved ejection fraction- - NYHA class III - euvolemic - weighing daily. She was instructed to call for an overnight weight gain of >2 pounds or a weekly weight gain of >5 pounds - weight up 5 pounds by our scale - says that she's adding some salt to her food and she was encouraged to not add any salt - does wear oxygen at 3L as needed and at bedtime - saw cardiologist Juliann Pares([place]) 07/11/17 - elevate legs when sitting for long periods of time - severe mitral stenosis and deemed a high risk surgical candidate - patient reports receiving her flu vaccine for this season already  2: HTN- - BP looks good today - Tolerating toprol XL She said that she sometimes has side effects from generic  medications. - taking losartan a couple of times a week now - saw PCP Allena Katz([place]) 10/10/17  3: COPD- - saw pulmonologist Meredeth Ide([place]) 11/02/17  Patient did not bring her medications nor a list. Each medication was verbally reviewed with the patient and she was encouraged to bring the bottles to every visit to confirm accuracy of list.  Will transition patient back to her cardiologist. She was instructed to call us in the future for any questions/problems to schedule another appointment.

## 2017-12-19 ENCOUNTER — Encounter: Payer: Self-pay | Admitting: Family

## 2017-12-19 ENCOUNTER — Ambulatory Visit: Payer: Medicare Other | Attending: Family | Admitting: Family

## 2017-12-19 VITALS — BP 138/60 | HR 87 | Resp 18 | Ht 64.0 in | Wt 187.2 lb

## 2017-12-19 DIAGNOSIS — J449 Chronic obstructive pulmonary disease, unspecified: Secondary | ICD-10-CM | POA: Diagnosis not present

## 2017-12-19 DIAGNOSIS — I080 Rheumatic disorders of both mitral and aortic valves: Secondary | ICD-10-CM | POA: Insufficient documentation

## 2017-12-19 DIAGNOSIS — I110 Hypertensive heart disease with heart failure: Secondary | ICD-10-CM | POA: Insufficient documentation

## 2017-12-19 DIAGNOSIS — Z8673 Personal history of transient ischemic attack (TIA), and cerebral infarction without residual deficits: Secondary | ICD-10-CM | POA: Insufficient documentation

## 2017-12-19 DIAGNOSIS — E785 Hyperlipidemia, unspecified: Secondary | ICD-10-CM | POA: Diagnosis not present

## 2017-12-19 DIAGNOSIS — J410 Simple chronic bronchitis: Secondary | ICD-10-CM

## 2017-12-19 DIAGNOSIS — I509 Heart failure, unspecified: Secondary | ICD-10-CM | POA: Diagnosis present

## 2017-12-19 DIAGNOSIS — D649 Anemia, unspecified: Secondary | ICD-10-CM | POA: Insufficient documentation

## 2017-12-19 DIAGNOSIS — Z96653 Presence of artificial knee joint, bilateral: Secondary | ICD-10-CM | POA: Insufficient documentation

## 2017-12-19 DIAGNOSIS — G5600 Carpal tunnel syndrome, unspecified upper limb: Secondary | ICD-10-CM | POA: Diagnosis not present

## 2017-12-19 DIAGNOSIS — I10 Essential (primary) hypertension: Secondary | ICD-10-CM

## 2017-12-19 DIAGNOSIS — K219 Gastro-esophageal reflux disease without esophagitis: Secondary | ICD-10-CM | POA: Insufficient documentation

## 2017-12-19 DIAGNOSIS — G4733 Obstructive sleep apnea (adult) (pediatric): Secondary | ICD-10-CM | POA: Insufficient documentation

## 2017-12-19 DIAGNOSIS — Z9889 Other specified postprocedural states: Secondary | ICD-10-CM | POA: Insufficient documentation

## 2017-12-19 DIAGNOSIS — Z79899 Other long term (current) drug therapy: Secondary | ICD-10-CM | POA: Insufficient documentation

## 2017-12-19 DIAGNOSIS — Z7982 Long term (current) use of aspirin: Secondary | ICD-10-CM | POA: Diagnosis not present

## 2017-12-19 DIAGNOSIS — I5032 Chronic diastolic (congestive) heart failure: Secondary | ICD-10-CM

## 2017-12-19 DIAGNOSIS — M1990 Unspecified osteoarthritis, unspecified site: Secondary | ICD-10-CM | POA: Insufficient documentation

## 2017-12-19 NOTE — Patient Instructions (Signed)
Continue weighing daily and call for an overnight weight gain of > 2 pounds or a weekly weight gain of >5 pounds. 

## 2018-02-17 ENCOUNTER — Emergency Department: Payer: Medicare Other

## 2018-02-17 ENCOUNTER — Encounter: Payer: Self-pay | Admitting: Emergency Medicine

## 2018-02-17 ENCOUNTER — Inpatient Hospital Stay
Admission: EM | Admit: 2018-02-17 | Discharge: 2018-02-20 | DRG: 065 | Disposition: A | Discharge status: Home / Self Care | Payer: Medicare Other | Attending: Internal Medicine | Admitting: Internal Medicine

## 2018-02-17 DIAGNOSIS — R402142 Coma scale, eyes open, spontaneous, at arrival to emergency department: Secondary | ICD-10-CM | POA: Diagnosis present

## 2018-02-17 DIAGNOSIS — I6340 Cerebral infarction due to embolism of unspecified cerebral artery: Principal | ICD-10-CM | POA: Diagnosis present

## 2018-02-17 DIAGNOSIS — I2510 Atherosclerotic heart disease of native coronary artery without angina pectoris: Secondary | ICD-10-CM | POA: Diagnosis present

## 2018-02-17 DIAGNOSIS — I739 Peripheral vascular disease, unspecified: Secondary | ICD-10-CM | POA: Diagnosis present

## 2018-02-17 DIAGNOSIS — R402252 Coma scale, best verbal response, oriented, at arrival to emergency department: Secondary | ICD-10-CM | POA: Diagnosis present

## 2018-02-17 DIAGNOSIS — M1990 Unspecified osteoarthritis, unspecified site: Secondary | ICD-10-CM | POA: Diagnosis present

## 2018-02-17 DIAGNOSIS — G4733 Obstructive sleep apnea (adult) (pediatric): Secondary | ICD-10-CM | POA: Diagnosis present

## 2018-02-17 DIAGNOSIS — I5032 Chronic diastolic (congestive) heart failure: Secondary | ICD-10-CM | POA: Diagnosis present

## 2018-02-17 DIAGNOSIS — I639 Cerebral infarction, unspecified: Secondary | ICD-10-CM | POA: Diagnosis present

## 2018-02-17 DIAGNOSIS — I130 Hypertensive heart and chronic kidney disease with heart failure and stage 1 through stage 4 chronic kidney disease, or unspecified chronic kidney disease: Secondary | ICD-10-CM | POA: Diagnosis present

## 2018-02-17 DIAGNOSIS — Z885 Allergy status to narcotic agent status: Secondary | ICD-10-CM

## 2018-02-17 DIAGNOSIS — Z888 Allergy status to other drugs, medicaments and biological substances status: Secondary | ICD-10-CM

## 2018-02-17 DIAGNOSIS — Z96653 Presence of artificial knee joint, bilateral: Secondary | ICD-10-CM | POA: Diagnosis present

## 2018-02-17 DIAGNOSIS — G8191 Hemiplegia, unspecified affecting right dominant side: Secondary | ICD-10-CM | POA: Diagnosis present

## 2018-02-17 DIAGNOSIS — E785 Hyperlipidemia, unspecified: Secondary | ICD-10-CM | POA: Diagnosis present

## 2018-02-17 DIAGNOSIS — I080 Rheumatic disorders of both mitral and aortic valves: Secondary | ICD-10-CM | POA: Diagnosis present

## 2018-02-17 DIAGNOSIS — R449 Unspecified symptoms and signs involving general sensations and perceptions: Secondary | ICD-10-CM | POA: Diagnosis present

## 2018-02-17 DIAGNOSIS — I10 Essential (primary) hypertension: Secondary | ICD-10-CM | POA: Diagnosis present

## 2018-02-17 DIAGNOSIS — R079 Chest pain, unspecified: Secondary | ICD-10-CM

## 2018-02-17 DIAGNOSIS — N183 Chronic kidney disease, stage 3 (moderate): Secondary | ICD-10-CM | POA: Diagnosis present

## 2018-02-17 DIAGNOSIS — R4189 Other symptoms and signs involving cognitive functions and awareness: Secondary | ICD-10-CM

## 2018-02-17 DIAGNOSIS — Z7951 Long term (current) use of inhaled steroids: Secondary | ICD-10-CM

## 2018-02-17 DIAGNOSIS — E1122 Type 2 diabetes mellitus with diabetic chronic kidney disease: Secondary | ICD-10-CM | POA: Diagnosis present

## 2018-02-17 DIAGNOSIS — J449 Chronic obstructive pulmonary disease, unspecified: Secondary | ICD-10-CM | POA: Diagnosis present

## 2018-02-17 DIAGNOSIS — Z6831 Body mass index (BMI) 31.0-31.9, adult: Secondary | ICD-10-CM

## 2018-02-17 DIAGNOSIS — J9611 Chronic respiratory failure with hypoxia: Secondary | ICD-10-CM | POA: Diagnosis present

## 2018-02-17 DIAGNOSIS — E669 Obesity, unspecified: Secondary | ICD-10-CM | POA: Diagnosis present

## 2018-02-17 DIAGNOSIS — R531 Weakness: Secondary | ICD-10-CM

## 2018-02-17 DIAGNOSIS — Z79899 Other long term (current) drug therapy: Secondary | ICD-10-CM

## 2018-02-17 DIAGNOSIS — Z8673 Personal history of transient ischemic attack (TIA), and cerebral infarction without residual deficits: Secondary | ICD-10-CM

## 2018-02-17 DIAGNOSIS — R479 Unspecified speech disturbances: Secondary | ICD-10-CM

## 2018-02-17 DIAGNOSIS — R4701 Aphasia: Secondary | ICD-10-CM | POA: Diagnosis present

## 2018-02-17 DIAGNOSIS — I6523 Occlusion and stenosis of bilateral carotid arteries: Secondary | ICD-10-CM | POA: Diagnosis present

## 2018-02-17 DIAGNOSIS — R29701 NIHSS score 1: Secondary | ICD-10-CM | POA: Diagnosis present

## 2018-02-17 DIAGNOSIS — K219 Gastro-esophageal reflux disease without esophagitis: Secondary | ICD-10-CM | POA: Diagnosis present

## 2018-02-17 DIAGNOSIS — Z7982 Long term (current) use of aspirin: Secondary | ICD-10-CM

## 2018-02-17 DIAGNOSIS — R402362 Coma scale, best motor response, obeys commands, at arrival to emergency department: Secondary | ICD-10-CM | POA: Diagnosis present

## 2018-02-17 DIAGNOSIS — Z90710 Acquired absence of both cervix and uterus: Secondary | ICD-10-CM

## 2018-02-17 LAB — TROPONIN I: Troponin I: 0.04 ng/mL (ref <0.03)

## 2018-02-17 LAB — URINALYSIS, COMPLETE (UACMP) WITH MICROSCOPIC
Bilirubin Urine: NEGATIVE
GLUCOSE, UA: NEGATIVE mg/dL
Hgb urine dipstick: NEGATIVE
KETONES UR: NEGATIVE mg/dL
Leukocytes, UA: NEGATIVE
Nitrite: NEGATIVE
PROTEIN: NEGATIVE mg/dL
Specific Gravity, Urine: >1.046 — ABNORMAL HIGH (ref 1.005–1.030)
pH: 5 (ref 5.0–8.0)

## 2018-02-17 LAB — CBC
HCT: 32.9 % — ABNORMAL LOW (ref 35.0–47.0)
HEMOGLOBIN: 10.7 g/dL — AB (ref 12.0–16.0)
MCH: 28.5 pg (ref 26.0–34.0)
MCHC: 32.6 g/dL (ref 32.0–36.0)
MCV: 87.4 fL (ref 80.0–100.0)
Platelets: 201 10*3/uL (ref 150–440)
RBC: 3.76 MIL/uL — AB (ref 3.80–5.20)
RDW: 15.1 % — ABNORMAL HIGH (ref 11.5–14.5)
WBC: 5.7 10*3/uL (ref 3.6–11.0)

## 2018-02-17 LAB — APTT: APTT: 30 s (ref 24–36)

## 2018-02-17 LAB — BASIC METABOLIC PANEL
ANION GAP: 6 (ref 5–15)
BUN: 28 mg/dL — ABNORMAL HIGH (ref 6–20)
CHLORIDE: 110 mmol/L (ref 101–111)
CO2: 26 mmol/L (ref 22–32)
Calcium: 8.7 mg/dL — ABNORMAL LOW (ref 8.9–10.3)
Creatinine, Ser: 1.22 mg/dL — ABNORMAL HIGH (ref 0.44–1.00)
GFR calc non Af Amer: 39 mL/min — ABNORMAL LOW (ref >60)
GFR, EST AFRICAN AMERICAN: 45 mL/min — AB (ref >60)
Glucose, Bld: 114 mg/dL — ABNORMAL HIGH (ref 65–99)
Potassium: 3.9 mmol/L (ref 3.5–5.1)
Sodium: 142 mmol/L (ref 135–145)

## 2018-02-17 LAB — URINE DRUG SCREEN, QUALITATIVE (ARMC ONLY)
Amphetamines, Ur Screen: NOT DETECTED
BARBITURATES, UR SCREEN: NOT DETECTED
Benzodiazepine, Ur Scrn: NOT DETECTED
CANNABINOID 50 NG, UR ~~LOC~~: NOT DETECTED
COCAINE METABOLITE, UR ~~LOC~~: NOT DETECTED
MDMA (Ecstasy)Ur Screen: NOT DETECTED
METHADONE SCREEN, URINE: NOT DETECTED
OPIATE, UR SCREEN: NOT DETECTED
Phencyclidine (PCP) Ur S: NOT DETECTED
TRICYCLIC, UR SCREEN: NOT DETECTED

## 2018-02-17 LAB — PROTIME-INR
INR: 1.04
Prothrombin Time: 13.5 seconds (ref 11.4–15.2)

## 2018-02-17 LAB — GLUCOSE, CAPILLARY: Glucose-Capillary: 106 mg/dL — ABNORMAL HIGH (ref 65–99)

## 2018-02-17 MED ORDER — IOHEXOL 350 MG/ML SOLN
60.0000 mL | Freq: Once | INTRAVENOUS | Status: AC | PRN
  Administered 2018-02-17: 60 mL via INTRAVENOUS

## 2018-02-17 NOTE — ED Notes (Signed)
ED Provider at bedside. 

## 2018-02-17 NOTE — ED Provider Notes (Signed)
[facility] Emergency Department Provider Note  ____________________________________________   I have reviewed the triage vital signs and the nursing notes.   HISTORY  Chief Complaint Abnormal sensation of the right upper extremity  History limited by: Not Limited   HPI Rose [name] is a 84 y.o. female who presents to the emergency department today because of concern for change in sensation to her right upper arm. It occurred roughly 3.5 hours prior to my evaluation. She has a hard time describing it but states there is some decreased sensation. In addition the patient has felt like she has been weak in both of her legs. Additionally her speech has been coming and going. She denies any headaches. Denies any fevers. Denies any recent illness. States she has had similar symptoms in the past and was told she had a mini stroke.    Per medical record review patient has a history of TIA.  Past Medical History:  Diagnosis Date  . Allergic rhinitis   . Anemia   . Arrhythmia    s/p ablation for A-fib  . Carpal tunnel syndrome   . CHF (congestive heart failure) (HCC)   . GERD (gastroesophageal reflux disease)   . Heart murmur   . Hyperlipidemia   . Hypertension   . Lumbar spine pain   . Nephrolithiasis   . Osteoarthritis   . Sleep apnea   . TIA (transient ischemic attack)     Patient Active Problem List   Diagnosis Date Noted  . COPD (chronic obstructive pulmonary disease) (HCC) 12/19/2017  . Renal artery stenosis (HCC) 12/06/2017  . Bilateral carotid artery stenosis 12/06/2017  . COPD with acute exacerbation (HCC) 02/01/2017  . Chronic diastolic heart failure (HCC) 09/23/2016  . GERD (gastroesophageal reflux disease) 08/17/2016  . Headache 08/17/2016  . History of PSVT (paroxysmal supraventricular tachycardia) 08/17/2016  . OA (osteoarthritis) 08/17/2016  . OSA (obstructive sleep apnea) 08/17/2016  . TIA (transient ischemic attack) 08/17/2016  .  Risk for falls 07/27/2016  . Diverticulitis of large intestine without perforation or abscess without bleeding 04/16/2015  . SOB (shortness of breath) 08/05/2011  . HTN (hypertension) 08/05/2011  . Hyperlipidemia 08/05/2011  . PVD (peripheral vascular disease) (HCC) 08/05/2011  . CAD (coronary artery disease) 08/05/2011    Past Surgical History:  Procedure Laterality Date  . ABDOMINAL HYSTERECTOMY    . ablation  January 2011   A-fib  . BACK SURGERY    . CARDIAC CATHETERIZATION    . ESOPHAGOGASTRODUODENOSCOPY N/A 05/22/2015   Procedure: ESOPHAGOGASTRODUODENOSCOPY (EGD);  Surgeon: Scot Jun, MD;  Location: [facility] ENDOSCOPY;  Service: Endoscopy;  Laterality: N/A;  . RENAL ARTERY STENT    . REPLACEMENT TOTAL KNEE BILATERAL    . SAVORY DILATION N/A 05/22/2015   Procedure: SAVORY DILATION;  Surgeon: Scot Jun, MD;  Location: [facility] ENDOSCOPY;  Service: Endoscopy;  Laterality: N/A;  . TEE WITHOUT CARDIOVERSION N/A 05/26/2017   Procedure: TRANSESOPHAGEAL ECHOCARDIOGRAM (TEE);  Surgeon: Alwyn Pea, MD;  Location: [facility] ORS;  Service: Cardiovascular;  Laterality: N/A;  . TONSILLECTOMY      Prior to Admission medications   Medication Sig Start Date End Date Taking? Authorizing Provider  acetaminophen (TYLENOL) 500 MG tablet Take 500-1,000 mg by mouth every 6 (six) hours as needed (pain).    [provider]  albuterol (PROVENTIL HFA;VENTOLIN HFA) 108 (90 BASE) MCG/ACT inhaler Inhale 2 puffs into the lungs every 6 (six) hours as needed for wheezing or shortness of breath.    [provider]  aspirin EC 81 MG tablet Take 81 mg by mouth 3 (three) times a week.     [provider]  atorvastatin (LIPITOR) 10 MG tablet Take 10 mg by mouth at bedtime.     [provider]  budesonide-formoterol (SYMBICORT) 80-4.5 MCG/ACT inhaler Inhale 2 puffs into the lungs 2 (two) times daily.    [provider]  docusate sodium (COLACE) 100 MG capsule Take 100  mg by mouth 2 (two) times daily.    [provider]  furosemide (LASIX) 40 MG tablet Take 1 tablet (40 mg total) by mouth daily. 10/15/15   Alford Highland, MD  hydrALAZINE (APRESOLINE) 10 MG tablet Take 1 tablet (10 mg total) by mouth 2 (two) times daily. 10/15/15   Alford Highland, MD  isosorbide mononitrate (IMDUR) 30 MG 24 hr tablet Take 30 mg by mouth daily at 12 noon.  03/15/17 03/15/18  [provider]  losartan (COZAAR) 25 MG tablet Take 25 mg by mouth 2 (two) times a week.     [provider]  Magnesium 250 MG TABS Take 250 mg by mouth daily.     [provider]  Melatonin 5 MG CAPS Take 10 mg by mouth at bedtime.     [provider]  metoprolol succinate (TOPROL-XL) 25 MG 24 hr tablet Take 25 mg by mouth at bedtime.     [provider]  Multiple Vitamin (MULTIVITAMIN) tablet Take 1 tablet by mouth daily.    [provider]  OXYGEN Use.    [provider]  ranitidine (ZANTAC) 150 MG tablet TAKE ONE TABLET BY MOUTH TWICE DAILY HEARTBURN 07/14/16   [provider]  ranolazine (RANEXA) 500 MG 12 hr tablet Take 500 mg by mouth 2 (two) times daily.    [provider]  tiotropium (SPIRIVA) 18 MCG inhalation capsule Place 1 capsule into inhaler and inhale daily. 03/08/17 03/08/18  [provider]    Allergies Celecoxib; Avapro [irbesartan]; Lisinopril; Plavix [clopidogrel bisulfate]; Statins; and Ultram [tramadol hcl]  Family History  Problem Relation Age of Onset  . Diabetes Father   . Hypertension Mother     Social History Social History   Tobacco Use  . Smoking status: Never Smoker  . Smokeless tobacco: Never Used  Substance Use Topics  . Alcohol use: No  . Drug use: No    Review of Systems Constitutional: No fever/chills Eyes: No visual changes. ENT: No sore throat. Cardiovascular: Denies chest pain. Respiratory: Denies shortness of breath. Gastrointestinal: No abdominal pain.   No nausea, no vomiting.  No diarrhea.   Genitourinary: Negative for dysuria. Musculoskeletal: Negative for back pain. Skin: Negative for rash. Neurological: Positive for change in sensation to her right upper extremity, weakness in bilateral lower legs. Change in speech. ____________________________________________   PHYSICAL EXAM:  VITAL SIGNS: ED Triage Vitals  Enc Vitals Group     BP 02/17/18 1817 (!) 149/78     Pulse Rate 02/17/18 1817 77     Resp 02/17/18 1817 18     Temp 02/17/18 1817 98.1 F (36.7 C)     Temp Source 02/17/18 1817 Oral     SpO2 02/17/18 1817 92 %     Weight 02/17/18 1826 198 lb 3.1 oz (89.9 kg)     Height --      Head Circumference --      Peak Flow --      Pain Score 02/17/18 1826 0   Constitutional: Alert and oriented. Well  appearing and in no distress. Eyes: Conjunctivae are normal.  ENT   Head: Normocephalic and atraumatic.   Nose: No congestion/rhinnorhea.   Mouth/Throat: Mucous membranes are moist.   Neck: No stridor. Hematological/Lymphatic/Immunilogical: No cervical lymphadenopathy. Cardiovascular: Normal rate, regular rhythm.  No murmurs, rubs, or gallops.  Respiratory: Normal respiratory effort without tachypnea nor retractions. Breath sounds are clear and equal bilaterally. No wheezes/rales/rhonchi. Gastrointestinal: Soft and non tender. No rebound. No guarding.  Genitourinary: Deferred Musculoskeletal: Normal range of motion in all extremities. No lower extremity edema. Neurologic:  Subjective change in sensation to right upper extremity. No pronator drift. Bilateral lower extremity weakness.  Skin:  Skin is warm, dry and intact. No rash noted. Psychiatric: Mood and affect are normal. Speech and behavior are normal. Patient exhibits appropriate insight and judgment.  ____________________________________________    LABS (pertinent positives/negatives)  Trop 0.04 BMP na 143, k 3.9, glu 114, cr 1.22 CBC wbc 5.7, hgb 10.7,  plt 201  ____________________________________________   EKG  I, Phineas SemenGraydon Orlie Cundari, attending physician, personally viewed and interpreted this EKG  EKG Time: 1822 Rate: 79 Rhythm: sinus rhythm Axis: normal Intervals: qtc 465 QRS: narrow, rsr' in v1 ST changes: no st elevation Impression: abnormal ekg   ____________________________________________    RADIOLOGY  CT head No acute stroke, evidence of previous stroke. Concern for MCA embolism  CTA angio No arterial embolism or thrombis.  I, Phineas SemenGOODMAN, Aeden Matranga, personally discussed these images and results by phone with the on-call radiologist and used this discussion as part of my medical decision making.   ____________________________________________   PROCEDURES  Procedures  CRITICAL CARE Performed by: Phineas SemenGOODMAN, Jaleal Schliep   Total critical care time: 35 minutes  Critical care time was exclusive of separately billable procedures and treating other patients.  Critical care was necessary to treat or prevent imminent or life-threatening deterioration.  Critical care was time spent personally by me on the following activities: development of treatment plan with patient and/or surrogate as well as nursing, discussions with consultants, evaluation of patient's response to treatment, examination of patient, obtaining history from patient or surrogate, ordering and performing treatments and interventions, ordering and review of laboratory studies, ordering and review of radiographic studies, pulse oximetry and re-evaluation of patient's condition.  ____________________________________________   INITIAL IMPRESSION / ASSESSMENT AND PLAN / ED COURSE  Pertinent labs & imaging results that were available during my care of the patient were reviewed by me and considered in my medical decision making (see chart for details).  Patient presented to the emergency department today because of concerns for change in sensation, weakness and  slurred speech.  Given constellation of symptoms could stroke was called.  Head CT showed concern for possible arterial thrombus.  CT Angie was performed which did not show any acute thrombus or embolism.  Patient was evaluated by neurology who did not recommend TPA at this time.  Will however plan on admission to the hospital service for further stroke workup.  ____________________________________________   FINAL CLINICAL IMPRESSION(S) / ED DIAGNOSES  Final diagnoses:  Weakness  Difficulty with speech     Note: This dictation was prepared with Dragon dictation. Any transcriptional errors that result from this process are unintentional     Phineas SemenGoodman, Jayko Voorhees, MD 02/17/18 2138

## 2018-02-17 NOTE — H&P (Signed)
[facility] - [place] at [facility]   PATIENT NAME: Rose [name]    MR#:  [id number]  DATE OF BIRTH:  09/22/1930  DATE OF ADMISSION:  02/17/2018  PRIMARY CARE PHYSICIAN: Pennie BanterPatel, Bhavesh, MD   REQUESTING/REFERRING PHYSICIAN: Derrill KayGoodman, MD  CHIEF COMPLAINT:   Chief Complaint  Patient presents with  . Weakness    HISTORY OF PRESENT ILLNESS:  Rose [name]  is a 84 y.o. female who presents with an episode of right upper extremity sensory deficit, weakness, slurred speech.  Patient states this happened between 330 and 4:30 in the afternoon, and has persisted somewhat since that time, although symptoms have improved.  She states that her speech is significantly improved breathing back to baseline.  Her sensation in her right upper extremity is much improved, almost back to baseline.  Initial workup in the ED was largely within normal limits, though her CTA head did show vascular disease.  Hospitalist were called for admission  PAST MEDICAL HISTORY:   Past Medical History:  Diagnosis Date  . Allergic rhinitis   . Anemia   . Arrhythmia    s/p ablation for A-fib  . Carpal tunnel syndrome   . CHF (congestive heart failure) (HCC)   . GERD (gastroesophageal reflux disease)   . Heart murmur   . Hyperlipidemia   . Hypertension   . Lumbar spine pain   . Nephrolithiasis   . Osteoarthritis   . Sleep apnea   . TIA (transient ischemic attack)      PAST SURGICAL HISTORY:   Past Surgical History:  Procedure Laterality Date  . ABDOMINAL HYSTERECTOMY    . ablation  January 2011   A-fib  . BACK SURGERY    . CARDIAC CATHETERIZATION    . ESOPHAGOGASTRODUODENOSCOPY N/A 05/22/2015   Procedure: ESOPHAGOGASTRODUODENOSCOPY (EGD);  Surgeon: Scot Junobert T Elliott, MD;  Location: [facility] ENDOSCOPY;  Service: Endoscopy;  Laterality: N/A;  . RENAL ARTERY STENT    . REPLACEMENT TOTAL KNEE BILATERAL    . SAVORY DILATION N/A 05/22/2015   Procedure: SAVORY DILATION;  Surgeon: Scot Junobert T  Elliott, MD;  [facility] ENDOSCOPY;  Service: Endoscopy;  Laterality: N/A;  . TEE WITHOUT CARDIOVERSION N/A 05/26/2017   Procedure: TRANSESOPHAGEAL ECHOCARDIOGRAM (TEE);  Surgeon: Alwyn Peaallwood, Dwayne D, MD;  Location: [facility] ORS;  Service: Cardiovascular;  Laterality: N/A;  . TONSILLECTOMY       SOCIAL HISTORY:   Social History   Tobacco Use  . Smoking status: Never Smoker  . Smokeless tobacco: Never Used  Substance Use Topics  . Alcohol use: No     FAMILY HISTORY:   Family History  Problem Relation Age of Onset  . Diabetes Father   . Hypertension Mother      DRUG ALLERGIES:   Allergies  Allergen Reactions  . Celecoxib Other (See Comments)    Unknown.  Marland Kitchen. Avapro [Irbesartan] Other (See Comments)    Unknown  . Lisinopril Other (See Comments)    Unknown  . Plavix [Clopidogrel Bisulfate] Other (See Comments)    Unknown  . Statins Other (See Comments)    Unknown  . Ultram [Tramadol Hcl] Other (See Comments)    Unknown    MEDICATIONS AT HOME:   Prior to Admission medications   Medication Sig Start Date End Date Taking? Authorizing Provider  aspirin EC 81 MG tablet Take 81 mg by mouth 3 (three) times a week.    Yes [provider]  atorvastatin (LIPITOR) 10 MG tablet Take 10 mg by mouth at bedtime.  Yes [provider]  Calcium-Magnesium-Zinc [phone] MG TABS Take 1 tablet by mouth once a week.   Yes [provider]  cholecalciferol (VITAMIN D) 1000 units tablet Take 1,000 Units by mouth daily.   Yes [provider]  docusate sodium (COLACE) 100 MG capsule Take 100 mg by mouth 2 (two) times daily.   Yes [provider]  furosemide (LASIX) 40 MG tablet Take 1 tablet (40 mg total) by mouth daily. 10/15/15  Yes Wieting, Richard, MD  hydrALAZINE (APRESOLINE) 10 MG tablet Take 1 tablet (10 mg total) by mouth 2 (two) times daily. 10/15/15  Yes Wieting, Richard, MD  isosorbide mononitrate (IMDUR) 30 MG 24 hr tablet Take 30 mg by  mouth daily at 12 noon.  03/15/17 03/15/18 Yes [provider]  losartan (COZAAR) 25 MG tablet Take 25 mg by mouth every other day.    Yes [provider]  Melatonin 5 MG CAPS Take 10 mg by mouth at bedtime.    Yes [provider]  metoprolol succinate (TOPROL-XL) 25 MG 24 hr tablet Take 25 mg by mouth at bedtime.    Yes [provider]  Multiple Vitamin (MULTIVITAMIN) tablet Take 1 tablet by mouth daily.   Yes [provider]  ranitidine (ZANTAC) 150 MG tablet TAKE ONE TABLET BY MOUTH TWICE DAILY HEARTBURN 07/14/16  Yes [provider]  ranolazine (RANEXA) 500 MG 12 hr tablet Take 500 mg by mouth 2 (two) times daily.   Yes [provider]  vitamin B-12 (CYANOCOBALAMIN) 1000 MCG tablet Take 1,000 mcg by mouth daily.   Yes [provider]  vitamin k 100 MCG tablet Take 150 mcg by mouth daily.   Yes [provider]  acetaminophen (TYLENOL) 500 MG tablet Take 500-1,000 mg by mouth every 6 (six) hours as needed (pain).    [provider]  albuterol (PROVENTIL HFA;VENTOLIN HFA) 108 (90 BASE) MCG/ACT inhaler Inhale 2 puffs into the lungs every 6 (six) hours as needed for wheezing or shortness of breath.    [provider]  budesonide-formoterol (SYMBICORT) 80-4.5 MCG/ACT inhaler Inhale 2 puffs into the lungs 2 (two) times daily.    [provider]    REVIEW OF SYSTEMS:  Review of Systems  Constitutional: Negative for chills, fever, malaise/fatigue and weight loss.  HENT: Negative for ear pain, hearing loss and tinnitus.   Eyes: Negative for blurred vision, double vision, pain and redness.  Respiratory: Negative for cough, hemoptysis and shortness of breath.   Cardiovascular: Negative for chest pain, palpitations, orthopnea and leg swelling.  Gastrointestinal: Negative for abdominal pain, constipation, diarrhea, nausea and vomiting.  Genitourinary: Negative for dysuria, frequency and hematuria.   Musculoskeletal: Negative for back pain, joint pain and neck pain.  Skin:       No acne, rash, or lesions  Neurological: Positive for sensory change, speech change and focal weakness. Negative for dizziness and tremors.  Endo/Heme/Allergies: Negative for polydipsia. Does not bruise/bleed easily.  Psychiatric/Behavioral: Negative for depression. The patient is not nervous/anxious and does not have insomnia.      VITAL SIGNS:   Vitals:   02/17/18 1915 02/17/18 2015 02/17/18 2100 02/17/18 2310  BP: (!) 151/76 (!) 155/84 123/62 131/68  Pulse: 78 77 72 73  Resp: (!) 21 (!) 21 (!) 23 19  Temp:      TempSrc:      SpO2: 99% 100% 99% 95%  Weight:       Wt Readings from Last 3 Encounters:  02/17/18  89.9 kg (198 lb 3.1 oz)  12/19/17 84.9 kg (187 lb 4 oz)  12/06/17 82.9 kg (182 lb 12.8 oz)    PHYSICAL EXAMINATION:  Physical Exam  Vitals reviewed. Constitutional: She is oriented to person, place, and time. She appears well-developed and well-nourished. No distress.  HENT:  Head: Normocephalic and atraumatic.  Mouth/Throat: Oropharynx is clear and moist.  Eyes: Pupils are equal, round, and reactive to light. Conjunctivae and EOM are normal. No scleral icterus.  Neck: Normal range of motion. Neck supple. No JVD present. No thyromegaly present.  Cardiovascular: Normal rate, regular rhythm and intact distal pulses. Exam reveals no gallop and no friction rub.  No murmur heard. Respiratory: Effort normal and breath sounds normal. No respiratory distress. She has no wheezes. She has no rales.  GI: Soft. Bowel sounds are normal. She exhibits no distension. There is no tenderness.  Musculoskeletal: Normal range of motion. She exhibits no edema.  No arthritis, no gout  Lymphadenopathy:    She has no cervical adenopathy.  Neurological: She is alert and oriented to person, place, and time. No cranial nerve deficit.  Neurologic: Cranial nerves II-XII intact, Sensation intact to light  touch/pinprick, 5/5 strength in all extremities except for right upper extremity which has 4/5 strength, no dysarthria, no aphasia, no dysphagia, memory intact, no pronator drift   Skin: Skin is warm and dry. No rash noted. No erythema.  Psychiatric: She has a normal mood and affect. Her behavior is normal. Judgment and thought content normal.    LABORATORY PANEL:   CBC Recent Labs  Lab 02/17/18 1856  WBC 5.7  HGB 10.7*  HCT 32.9*  PLT 201   ------------------------------------------------------------------------------------------------------------------  Chemistries  Recent Labs  Lab 02/17/18 1856  NA 142  K 3.9  CL 110  CO2 26  GLUCOSE 114*  BUN 28*  CREATININE 1.22*  CALCIUM 8.7*   ------------------------------------------------------------------------------------------------------------------  Cardiac Enzymes Recent Labs  Lab 02/17/18 1856  TROPONINI 0.04*   ------------------------------------------------------------------------------------------------------------------  RADIOLOGY:  Ct Angio Head W Or Wo Contrast  Result Date: 02/17/2018 CLINICAL DATA:  Code stroke. Aphasia. Abnormal head CT with tiny hyperdensity in the left MCA branch region. EXAM: CT ANGIOGRAPHY HEAD TECHNIQUE: Multidetector CT imaging of the head was performed using the standard protocol during bolus administration of intravenous contrast. Multiplanar CT image reconstructions and MIPs were obtained to evaluate the vascular anatomy. CONTRAST:  60mL OMNIPAQUE IOHEXOL 350 MG/ML SOLN COMPARISON:  Earlier today.  MR angiography 07/09/2011. FINDINGS: CTA HEAD Anterior circulation: The carotid arteries show extensive peripheral calcification throughout the skull base and siphon region but flow does persist. Stenosis is difficult to measure accurately in this situation, but is probably approximately 50% on both sides. Supraclinoid internal carotid arteries are widely patent. The anterior and middle  cerebral vessels are patent without proximal stenosis, aneurysm or vascular malformation. The M2 and M3 branch vessels opacify. The tiny calcification seen at noncontrast head CT is associated with a vessel, but is not occlusive. Posterior circulation: Chronic left vertebral artery occlusion. The right vertebral artery shows atherosclerotic change at the foramen magnum with stenosis of 50%. Distal vertebral artery is widely patent to the basilar. There is retrograde flow to left PICA. The basilar artery shows atherosclerotic irregularity but no critical stenosis. Superior cerebellar and posterior cerebral arteries show flow. Venous sinuses: Patent and normal. Anatomic variants: None significant Delayed phase: No abnormal enhancement. IMPRESSION: No large or medium vessel occlusion. The tiny calcification seen at previous CT is associated with the left M3  branch but is not occlusive. Extensive chronic atherosclerotic calcification throughout both carotid siphon regions with stenosis estimated at 50%. Chronic left vertebral occlusion. 50% stenosis of the right V4 segment. These results were called by telephone at the time of interpretation on 02/17/2018 at 8:18 pm to Dr. Phineas Semen , who verbally acknowledged these results. Electronically Signed   By: Paulina Fusi M.D.   On: 02/17/2018 20:19   Ct Head Code Stroke Wo Contrast  Result Date: 02/17/2018 CLINICAL DATA:  Code stroke.  Aphasia beginning 4 hours ago. EXAM: CT HEAD WITHOUT CONTRAST TECHNIQUE: Contiguous axial images were obtained from the base of the skull through the vertex without intravenous contrast. COMPARISON:  10/07/2013 CT.  07/09/2011 MRI. FINDINGS: Brain: No abnormality seen affecting the brainstem. Probably old small vessel infarction in the left cerebellum. Cerebral hemispheres show atrophy with chronic small-vessel changes of the white matter. Old right frontal cortical and subcortical infarction. Old left parieto-occipital cortical and  subcortical infarction. No sign of acute infarction, mass lesion, hemorrhage, hydrocephalus or extra-axial collection. Vascular: Question hyperdense left MCA in the insular region. Skull: Normal Sinuses/Orbits: Clear/normal Other: None ASPECTS (Alberta Stroke Program Early CT Score) - Ganglionic level infarction (caudate, lentiform nuclei, internal capsule, insula, M1-M3 cortex): 7 - Supraganglionic infarction (M4-M6 cortex): 3 Total score (0-10 with 10 being normal): 10 IMPRESSION: 1. No acute parenchymal finding by CT at this time. Old right frontal and left parietooccipital infarctions. However, it appears that there is a hyperdense left middle cerebral artery in the insular region. 2. ASPECTS is 10. 3. These results were called by telephone at the time of interpretation on 02/17/2018 at 7:15 pm to Dr. Phineas Semen , who verbally acknowledged these results. Electronically Signed   By: Paulina Fusi M.D.   On: 02/17/2018 19:17    EKG:   Orders placed or performed during the hospital encounter of 02/17/18  . EKG 12-Lead  . EKG 12-Lead  . ED EKG  . ED EKG    IMPRESSION AND PLAN:  Principal Problem:   Sensory deficit, right -patient reports this is almost resolved to baseline, though she does have some objective weakness in that arm on exam still.  We have admitted her per stroke admission order set with appropriate labs and consults.  As far as imaging goes, the patient states she had back surgery and was given a card to show whenever she went through metal detectors at the airport, indicating likely placement of metal in her back.  This likely means she will be unable to get an MRI, defer to neurology recommendations for further imaging. Active Problems:   HTN (hypertension) -permissive hypertension tonight, blood pressure goal less than 220/120   CAD (coronary artery disease) -continue home meds   Chronic diastolic heart failure (HCC) -continue home medications   COPD (chronic obstructive  pulmonary disease) (HCC) -home dose inhalers   Hyperlipidemia -home dose antilipid   GERD (gastroesophageal reflux disease) -home dose PPI  Chart review performed and case discussed with ED provider. Labs, imaging and/or ECG reviewed by provider and discussed with patient/family. Management plans discussed with the patient and/or family.  DVT PROPHYLAXIS: SubQ lovenox  GI PROPHYLAXIS: PPI  ADMISSION STATUS: Observation  CODE STATUS: Full Code Status History    Date Active Date Inactive Code Status Order ID Comments User Context   06/20/2016 1421 06/20/2016 1722 Full Code [id number]  Auburn Bilberry, MD ED   10/13/2015 1855 10/15/2015 1514 Full Code [id number]  Ramonita Lab, MD Inpatient  TOTAL TIME TAKING CARE OF THIS PATIENT: 40 minutes.   Fletcher Ostermiller FIELDING 02/17/2018, 11:38 PM  [facility]  Office  [phone]  CC: Primary care physician; Pennie Banter, MD  Note:  This document was prepared using Dragon voice recognition software and may include unintentional dictation errors.

## 2018-02-17 NOTE — Consult Note (Addendum)
[facility] TeleNeurology Consult Services  Asked to see this patient in telemedicine consultation. Consultation was performed with assistance of ancillary/medical staff at bedside.  Comments: Last Known normal  1600 Door Time: 1804 [facility] Contacted: 1927 [facility] first log in: 1935 NIHSS assessment time: 1948 Call back time: 1959 Needle Time: no iv tpa  HPI:  [age] yof with acute onset of altered speech and generalized weakness this afternoon while with family. She presented to ED for evaluation and underwent ct scan head. She has prior R frontal and left parietal/occipital infarcts on ct scan head. She has hx of heart failure, hypertension, hyperlipidemia and per family takes asa every other day but no a/c.   VSS  Gen Wn/Wd in Nad  TeleStroke Assessment: LOC:   0 LOC questions:  0 LOC Commands :   0 Gaze : 0 Visual fields :  0  Facial movements : 0 Upper limb Motor  0 Lower limb Motor  0 Limb Coordination  - 0 Sensory -  - 1 Language -  0 Speech -   0 Neglect / extinction -  0  NIHSS Score: 1   IMPRESSION  TIA RO L Hemispheric Stroke   Medical Decision Making:   Patient is not candidate for alteplase due to non disabling sxs and rapid resolution of generalized sxs and after d/w family about alteplase risk/benefit  they also agreed with holding off on thrombolytic therapy.  Not an IR candidate as low clinical suspicion for LVO by neurologic assessment, however ? Dense artery sign on L, will order advance imaging.  CTA without evidence of LVO per radiology report.   Recommendations: - Daily antithrombotics to initiate now if no contraindication.  - Further work up with Stroke labs, MRI brain, ECHO, NIVS Carotid will be deferred to inpt neurology service - Needs Inpatient Neurology consultation and follow up  - Thank you for allowing us to participate in the care of your patient, if there are any questions please don't hesitate to contact  us  Discussed plan of care with patient/family/hospital staff   Physician: Terrace ArabiaMohammed Cecile Gillispie, DO   [facility]

## 2018-02-17 NOTE — ED Notes (Signed)
Family at bedside.  Per Conversation with Dr Derrill KayGoodman consultation with (neuro MD) cancel CODE STROKE

## 2018-02-17 NOTE — ED Notes (Signed)
Patient transported to CT with RN 

## 2018-02-17 NOTE — ED Notes (Signed)
Jeanice LimHolly, RN - stroke RN, CODE STROKE (via Dr Derrill KayGoodman) initiated with LKW of 2:30 pm per daughter

## 2018-02-17 NOTE — ED Notes (Signed)
Per family members att reports LKW of 1600 b/c pt was talking to family member without defict  CRITICAL LAB: TROPONIN is 0.04, [facility], Dr. Derrill KayGoodman notified, orders received

## 2018-02-17 NOTE — ED Notes (Signed)
Return from CT

## 2018-02-17 NOTE — ED Notes (Signed)
Pt and family member reports feeling better and now speech normal

## 2018-02-17 NOTE — ED Triage Notes (Signed)
Patient states her weakness started in her right arm and then her speech became affected.  She nods when asked if she is eating and hydrating.  Patient seems to be having trouble talking.  Patient has no upper arm drift but weak lower extremities and only able to lift leg 2" off the bed for only a few seconds.  Patient is alert and oriented.

## 2018-02-17 NOTE — Progress Notes (Signed)
Chaplain responded to Code Stroke and engaged the patient's daughter and granddaughter, while the patient was receiving a CT scan. The family was calm and supportive of the patient on her 88th birthday. Though a sudden health concern, the family was composed. When the patient returned, Chaplain led the family in prayer.  Family will page Chaplain, if needed.

## 2018-02-18 ENCOUNTER — Observation Stay: Payer: Medicare Other

## 2018-02-18 ENCOUNTER — Other Ambulatory Visit: Payer: Self-pay

## 2018-02-18 ENCOUNTER — Encounter: Payer: Self-pay | Admitting: *Deleted

## 2018-02-18 ENCOUNTER — Observation Stay
Admit: 2018-02-18 | Discharge: 2018-02-18 | Disposition: A | Discharge status: Home / Self Care | Payer: Medicare Other | Attending: Internal Medicine | Admitting: Internal Medicine

## 2018-02-18 ENCOUNTER — Inpatient Hospital Stay: Payer: Medicare Other

## 2018-02-18 DIAGNOSIS — I6521 Occlusion and stenosis of right carotid artery: Secondary | ICD-10-CM | POA: Diagnosis not present

## 2018-02-18 DIAGNOSIS — I6789 Other cerebrovascular disease: Secondary | ICD-10-CM | POA: Diagnosis not present

## 2018-02-18 DIAGNOSIS — I080 Rheumatic disorders of both mitral and aortic valves: Secondary | ICD-10-CM | POA: Diagnosis present

## 2018-02-18 DIAGNOSIS — I5032 Chronic diastolic (congestive) heart failure: Secondary | ICD-10-CM | POA: Diagnosis present

## 2018-02-18 DIAGNOSIS — I130 Hypertensive heart and chronic kidney disease with heart failure and stage 1 through stage 4 chronic kidney disease, or unspecified chronic kidney disease: Secondary | ICD-10-CM | POA: Diagnosis present

## 2018-02-18 DIAGNOSIS — I739 Peripheral vascular disease, unspecified: Secondary | ICD-10-CM | POA: Diagnosis present

## 2018-02-18 DIAGNOSIS — I639 Cerebral infarction, unspecified: Secondary | ICD-10-CM | POA: Diagnosis present

## 2018-02-18 DIAGNOSIS — R531 Weakness: Secondary | ICD-10-CM | POA: Diagnosis present

## 2018-02-18 DIAGNOSIS — N183 Chronic kidney disease, stage 3 (moderate): Secondary | ICD-10-CM | POA: Diagnosis present

## 2018-02-18 DIAGNOSIS — Z7982 Long term (current) use of aspirin: Secondary | ICD-10-CM | POA: Diagnosis not present

## 2018-02-18 DIAGNOSIS — I10 Essential (primary) hypertension: Secondary | ICD-10-CM | POA: Diagnosis not present

## 2018-02-18 DIAGNOSIS — J449 Chronic obstructive pulmonary disease, unspecified: Secondary | ICD-10-CM | POA: Diagnosis present

## 2018-02-18 DIAGNOSIS — G8191 Hemiplegia, unspecified affecting right dominant side: Secondary | ICD-10-CM | POA: Diagnosis present

## 2018-02-18 DIAGNOSIS — R4189 Other symptoms and signs involving cognitive functions and awareness: Secondary | ICD-10-CM

## 2018-02-18 DIAGNOSIS — Z6831 Body mass index (BMI) 31.0-31.9, adult: Secondary | ICD-10-CM | POA: Diagnosis not present

## 2018-02-18 DIAGNOSIS — K219 Gastro-esophageal reflux disease without esophagitis: Secondary | ICD-10-CM | POA: Diagnosis present

## 2018-02-18 DIAGNOSIS — I6523 Occlusion and stenosis of bilateral carotid arteries: Secondary | ICD-10-CM | POA: Diagnosis present

## 2018-02-18 DIAGNOSIS — E669 Obesity, unspecified: Secondary | ICD-10-CM | POA: Diagnosis present

## 2018-02-18 DIAGNOSIS — R4701 Aphasia: Secondary | ICD-10-CM | POA: Diagnosis present

## 2018-02-18 DIAGNOSIS — G459 Transient cerebral ischemic attack, unspecified: Secondary | ICD-10-CM | POA: Diagnosis not present

## 2018-02-18 DIAGNOSIS — E785 Hyperlipidemia, unspecified: Secondary | ICD-10-CM | POA: Diagnosis present

## 2018-02-18 DIAGNOSIS — J9611 Chronic respiratory failure with hypoxia: Secondary | ICD-10-CM | POA: Diagnosis present

## 2018-02-18 DIAGNOSIS — G4733 Obstructive sleep apnea (adult) (pediatric): Secondary | ICD-10-CM | POA: Diagnosis present

## 2018-02-18 DIAGNOSIS — R29701 NIHSS score 1: Secondary | ICD-10-CM | POA: Diagnosis present

## 2018-02-18 DIAGNOSIS — I2510 Atherosclerotic heart disease of native coronary artery without angina pectoris: Secondary | ICD-10-CM | POA: Diagnosis present

## 2018-02-18 DIAGNOSIS — Z90710 Acquired absence of both cervix and uterus: Secondary | ICD-10-CM | POA: Diagnosis not present

## 2018-02-18 DIAGNOSIS — Z8673 Personal history of transient ischemic attack (TIA), and cerebral infarction without residual deficits: Secondary | ICD-10-CM | POA: Diagnosis not present

## 2018-02-18 DIAGNOSIS — I6340 Cerebral infarction due to embolism of unspecified cerebral artery: Secondary | ICD-10-CM | POA: Diagnosis present

## 2018-02-18 DIAGNOSIS — M1990 Unspecified osteoarthritis, unspecified site: Secondary | ICD-10-CM | POA: Diagnosis present

## 2018-02-18 DIAGNOSIS — Z888 Allergy status to other drugs, medicaments and biological substances status: Secondary | ICD-10-CM | POA: Diagnosis not present

## 2018-02-18 DIAGNOSIS — Z885 Allergy status to narcotic agent status: Secondary | ICD-10-CM | POA: Diagnosis not present

## 2018-02-18 LAB — BASIC METABOLIC PANEL
Anion gap: 6 (ref 5–15)
BUN: 21 mg/dL — AB (ref 6–20)
CALCIUM: 8.6 mg/dL — AB (ref 8.9–10.3)
CHLORIDE: 107 mmol/L (ref 101–111)
CO2: 28 mmol/L (ref 22–32)
CREATININE: 1.2 mg/dL — AB (ref 0.44–1.00)
GFR calc Af Amer: 46 mL/min — ABNORMAL LOW (ref >60)
GFR, EST NON AFRICAN AMERICAN: 40 mL/min — AB (ref >60)
Glucose, Bld: 124 mg/dL — ABNORMAL HIGH (ref 65–99)
Potassium: 4.2 mmol/L (ref 3.5–5.1)
SODIUM: 141 mmol/L (ref 135–145)

## 2018-02-18 LAB — TROPONIN I
TROPONIN I: 0.03 ng/mL — AB (ref <0.03)
TROPONIN I: 0.03 ng/mL — AB (ref <0.03)
Troponin I: 0.03 ng/mL (ref <0.03)

## 2018-02-18 LAB — LIPID PANEL
Cholesterol: 169 mg/dL (ref 0–200)
HDL: 55 mg/dL (ref >40)
LDL Cholesterol: 83 mg/dL (ref 0–99)
Total CHOL/HDL Ratio: 3.1 RATIO
Triglycerides: 155 mg/dL — ABNORMAL HIGH (ref <150)
VLDL: 31 mg/dL (ref 0–40)

## 2018-02-18 LAB — HEMOGLOBIN A1C
HEMOGLOBIN A1C: 5.8 % — AB (ref 4.8–5.6)
MEAN PLASMA GLUCOSE: 119.76 mg/dL

## 2018-02-18 MED ORDER — IOHEXOL 350 MG/ML SOLN
75.0000 mL | Freq: Once | INTRAVENOUS | Status: AC | PRN
  Administered 2018-02-18: 21:00:00 75 mL via INTRAVENOUS

## 2018-02-18 MED ORDER — FUROSEMIDE 40 MG PO TABS
40.0000 mg | ORAL_TABLET | Freq: Every day | ORAL | Status: DC
  Administered 2018-02-18 – 2018-02-20 (×3): 40 mg via ORAL
  Filled 2018-02-18 (×3): qty 1

## 2018-02-18 MED ORDER — ISOSORBIDE MONONITRATE ER 30 MG PO TB24
30.0000 mg | ORAL_TABLET | Freq: Every day | ORAL | Status: DC
  Administered 2018-02-18 – 2018-02-20 (×3): 30 mg via ORAL
  Filled 2018-02-18 (×4): qty 1

## 2018-02-18 MED ORDER — ASPIRIN EC 325 MG PO TBEC
325.0000 mg | DELAYED_RELEASE_TABLET | Freq: Every day | ORAL | Status: DC
  Administered 2018-02-18 – 2018-02-19 (×2): 325 mg via ORAL
  Filled 2018-02-18 (×2): qty 1

## 2018-02-18 MED ORDER — ENOXAPARIN SODIUM 40 MG/0.4ML ~~LOC~~ SOLN
40.0000 mg | SUBCUTANEOUS | Status: DC
  Administered 2018-02-18 – 2018-02-19 (×2): 40 mg via SUBCUTANEOUS
  Filled 2018-02-18 (×2): qty 0.4

## 2018-02-18 MED ORDER — ASPIRIN EC 81 MG PO TBEC: 81.0000 mg | DELAYED_RELEASE_TABLET | ORAL | Status: DC

## 2018-02-18 MED ORDER — MOMETASONE FURO-FORMOTEROL FUM 100-5 MCG/ACT IN AERO
2.0000 | INHALATION_SPRAY | Freq: Two times a day (BID) | RESPIRATORY_TRACT | Status: DC
  Administered 2018-02-18 – 2018-02-19 (×4): 2 via RESPIRATORY_TRACT
  Filled 2018-02-18: qty 8.8

## 2018-02-18 MED ORDER — GI COCKTAIL ~~LOC~~
30.0000 mL | Freq: Once | ORAL | Status: AC
  Administered 2018-02-18: 10:00:00 30 mL via ORAL
  Filled 2018-02-18: qty 30

## 2018-02-18 MED ORDER — RANOLAZINE ER 500 MG PO TB12
500.0000 mg | ORAL_TABLET | Freq: Two times a day (BID) | ORAL | Status: DC
  Administered 2018-02-18 – 2018-02-20 (×5): 500 mg via ORAL
  Filled 2018-02-18 (×6): qty 1

## 2018-02-18 MED ORDER — ATORVASTATIN CALCIUM 20 MG PO TABS
10.0000 mg | ORAL_TABLET | Freq: Every day | ORAL | Status: DC
  Administered 2018-02-18: 20:00:00 10 mg via ORAL
  Filled 2018-02-18: qty 1

## 2018-02-18 MED ORDER — ACETAMINOPHEN 325 MG PO TABS
650.0000 mg | ORAL_TABLET | ORAL | Status: DC | PRN
  Administered 2018-02-19: 13:00:00 650 mg via ORAL
  Filled 2018-02-18 (×2): qty 2

## 2018-02-18 MED ORDER — ACETAMINOPHEN 650 MG RE SUPP: 650.0000 mg | RECTAL | Status: DC | PRN

## 2018-02-18 MED ORDER — FAMOTIDINE 20 MG PO TABS
20.0000 mg | ORAL_TABLET | Freq: Two times a day (BID) | ORAL | Status: DC
  Administered 2018-02-18 – 2018-02-20 (×5): 20 mg via ORAL
  Filled 2018-02-18 (×5): qty 1

## 2018-02-18 MED ORDER — STROKE: EARLY STAGES OF RECOVERY BOOK
Freq: Once | Status: AC
  Administered 2018-02-18: 02:00:00

## 2018-02-18 MED ORDER — ACETAMINOPHEN 160 MG/5ML PO SOLN
650.0000 mg | ORAL | Status: DC | PRN
  Filled 2018-02-18: qty 20.3

## 2018-02-18 MED ORDER — NITROGLYCERIN 0.4 MG SL SUBL
0.4000 mg | SUBLINGUAL_TABLET | SUBLINGUAL | Status: DC | PRN
  Administered 2018-02-18: 0.4 mg via SUBLINGUAL
  Filled 2018-02-18: qty 1

## 2018-02-18 NOTE — Progress Notes (Signed)
PT Cancellation Note  Patient Details Name: Rose [name] MRN: [id number] DOB: 06/03/1930   Cancelled Treatment:    Reason Eval/Treat Not Completed: Patient declined, no reason specified(Consult received and chart reviewed.  Patient reports significant cramping to bilat LEs, declines participation with PT evaluation at this time.  Offered position change, WBing, OOB to help manage; continued to decline.  Will continue efforts next date.)   Genelle Economou H. Manson PasseyBrown, PT, DPT, NCS 02/18/18, 10:51 AM [phone]

## 2018-02-18 NOTE — Plan of Care (Signed)
  Problem: Activity: Goal: Risk for activity intolerance will decrease Outcome: Progressing  Pt oob to bsc x 1 min assist this shift

## 2018-02-18 NOTE — Consult Note (Addendum)
[facility] Vascular Consult Note  MRN : [id number]  Rose [name] is a 84 y.o. ([birth year]) female who presents with chief complaint of  Chief Complaint  Patient presents with  . Weakness   History of Present Illness:  The patient is an 84 year old female with a past medical history of chronic diastolic heart failure, hypertension, hyperlipidemia, coronary artery disease, COPD, peripheral vascular disease, carotid stenosis, obstructive sleep apnea who presented to the [facility] ED with concerns regarding acute right upper extremity weakness as well as bilateral lower extremity weakness.  Patient also notes intermittent aphasia.  The patient is well-known to our practice.  We follow the patient's carotid artery stenosis.  The patient was last seen in our clinic on December 06, 2017 and underwent a carotid duplex which was notable for: Right Carotid: Velocities in the right ICA are consistent with a 40-59% stenosis. The ECA appears >50% stenosed. No significant change when compared to the previous exam. Left Carotid: Velocities in the left ICA are consistent with a 1-39% stenosis. No significant change when compared to the previous exam. Vertebrals: Both vertebral arteries were patent with antegrade flow. Subclavians: Normal flow hemodynamics were seen in bilateral subclavian arteries.  The patient was asymptomatic at the time and recommended 1 year follow-up.  Today, the patient presented to the emergency department with right upper extremity and bilateral lower extremity weakness and intermittent aphasia.  The patient has had TIAs in the past.  Carotid ultrasound duplex conducted February 15, 2018 was notable for: 1. Critical right internal carotid artery stenosis with end-diastolic velocity greater than 40 centimeters/second and a sonographic string sign. 2. Mild (1-49%) stenosis proximal left internal carotid artery secondary to heterogenous  atherosclerotic plaque. 3. Vertebral arteries are patent with antegrade flow.  Vascular surgery was consulted by Dr Juliene Pina for further recommendations.  Current Facility-Administered Medications  Medication Dose Route Frequency Provider Last Rate Last Dose  . acetaminophen (TYLENOL) tablet 650 mg  650 mg Oral Q4H PRN Oralia Manis, MD       Or  . acetaminophen (TYLENOL) solution 650 mg  650 mg Per Tube Q4H PRN Oralia Manis, MD       Or  . acetaminophen (TYLENOL) suppository 650 mg  650 mg Rectal Q4H PRN Oralia Manis, MD      . aspirin EC tablet 325 mg  325 mg Oral Daily Adrian Saran, MD   325 mg at 02/18/18 0957  . atorvastatin (LIPITOR) tablet 10 mg  10 mg Oral QHS Oralia Manis, MD      . enoxaparin (LOVENOX) injection 40 mg  40 mg Subcutaneous Q24H Oralia Manis, MD      . famotidine (PEPCID) tablet 20 mg  20 mg Oral BID Oralia Manis, MD   20 mg at 02/18/18 0957  . furosemide (LASIX) tablet 40 mg  40 mg Oral Daily Oralia Manis, MD   40 mg at 02/18/18 0957  . isosorbide mononitrate (IMDUR) 24 hr tablet 30 mg  30 mg Oral Q1200 Oralia Manis, MD   30 mg at 02/18/18 1310  . mometasone-formoterol (DULERA) 100-5 MCG/ACT inhaler 2 puff  2 puff Inhalation BID Oralia Manis, MD   2 puff at 02/18/18 [phone]  . nitroGLYCERIN (NITROSTAT) SL tablet 0.4 mg  0.4 mg Sublingual Q5 min PRN Adrian Saran, MD   0.4 mg at 02/18/18 0931  . ranolazine (RANEXA) 12 hr tablet 500 mg  500 mg Oral BID Oralia Manis, MD   500 mg at  02/18/18 0957   Past Medical History:  Diagnosis Date  . Allergic rhinitis   . Anemia   . Arrhythmia    s/p ablation for A-fib  . Carpal tunnel syndrome   . CHF (congestive heart failure) (HCC)   . GERD (gastroesophageal reflux disease)   . Heart murmur   . Hyperlipidemia   . Hypertension   . Lumbar spine pain   . Nephrolithiasis   . Osteoarthritis   . Sleep apnea   . TIA (transient ischemic attack)    Past Surgical History:  Procedure Laterality Date  . ABDOMINAL HYSTERECTOMY     . ablation  January 2011   A-fib  . BACK SURGERY    . CARDIAC CATHETERIZATION    . ESOPHAGOGASTRODUODENOSCOPY N/A 05/22/2015   Procedure: ESOPHAGOGASTRODUODENOSCOPY (EGD);  Surgeon: Scot Jun, MD;  Location: [facility] ENDOSCOPY;  Service: Endoscopy;  Laterality: N/A;  . RENAL ARTERY STENT    . REPLACEMENT TOTAL KNEE BILATERAL    . SAVORY DILATION N/A 05/22/2015   Procedure: SAVORY DILATION;  Surgeon: Scot Jun, MD;  Location: [facility] ENDOSCOPY;  Service: Endoscopy;  Laterality: N/A;  . TEE WITHOUT CARDIOVERSION N/A 05/26/2017   Procedure: TRANSESOPHAGEAL ECHOCARDIOGRAM (TEE);  Surgeon: Alwyn Pea, MD;  Location: [facility] ORS;  Service: Cardiovascular;  Laterality: N/A;  . TONSILLECTOMY     Social History Social History   Tobacco Use  . Smoking status: Never Smoker  . Smokeless tobacco: Never Used  Substance Use Topics  . Alcohol use: No  . Drug use: No   Family History Family History  Problem Relation Age of Onset  . Diabetes Father   . Hypertension Mother   Patient denies any family history of peripheral artery disease, venous disease or bleeding/clotting disorders.  Allergies  Allergen Reactions  . Celecoxib Other (See Comments)    Unknown.  Marland Kitchen Avapro [Irbesartan] Other (See Comments)    Unknown  . Lisinopril Other (See Comments)    Unknown  . Plavix [Clopidogrel Bisulfate] Other (See Comments)    Unknown  . Statins Other (See Comments)    Unknown  . Ultram [Tramadol Hcl] Other (See Comments)    Unknown   REVIEW OF SYSTEMS (Negative unless checked)  Constitutional: [] Weight loss  [] Fever  [] Chills Cardiac: [] Chest pain   [] Chest pressure   [] Palpitations   [] Shortness of breath when laying flat   [] Shortness of breath at rest   [] Shortness of breath with exertion. Vascular:  [] Pain in legs with walking   [] Pain in legs at rest   [] Pain in legs when laying flat   [] Claudication   [] Pain in feet when walking  [] Pain in feet at rest  [] Pain in feet when laying  flat   [] History of DVT   [] Phlebitis   [] Swelling in legs   [] Varicose veins   [] Non-healing ulcers Pulmonary:   [] Uses home oxygen   [] Productive cough   [] Hemoptysis   [] Wheeze  [x] COPD   [] Asthma Neurologic:  [] Dizziness  [] Blackouts   [] Seizures   [x] History of stroke   [x] History of TIA  [x] Aphasia   [] Temporary blindness   [] Dysphagia   [x] Weakness or numbness in arms   [x] Weakness or numbness in legs Musculoskeletal:  [] Arthritis   [] Joint swelling   [] Joint pain   [] Low back pain Hematologic:  [] Easy bruising  [] Easy bleeding   [] Hypercoagulable state   [] Anemic  [] Hepatitis Gastrointestinal:  [] Blood in stool   [] Vomiting blood  [] Gastroesophageal reflux/heartburn   [] Difficulty swallowing. Genitourinary:  [x] Chronic kidney  disease   [] Difficult urination  [] Frequent urination  [] Burning with urination   [] Blood in urine Skin:  [] Rashes   [] Ulcers   [] Wounds Psychological:  [] History of anxiety   []  History of major depression.  Physical Examination  Vitals:   02/18/18 0112 02/18/18 0311 02/18/18 0516 02/18/18 1500  BP: 139/73 (!) 122/59 119/79 133/74  Pulse: 75 78 69 91  Resp: 16 18 20 18   Temp: 97.7 F (36.5 C) 98 F (36.7 C) 97.9 F (36.6 C) (!) 97.4 F (36.3 C)  TempSrc: Oral Oral Oral   SpO2: 99% 96% 97% 95%  Weight: 187 lb 14.4 oz (85.2 kg)     Height: 5\' 5"  (1.651 m)      Body mass index is 31.27 kg/m. Gen:  WD/WN, NAD Head: [place]/AT, No temporalis wasting. Prominent temp pulse not noted. Ear/Nose/Throat: Hearing grossly intact, nares w/o erythema or drainage, oropharynx w/o Erythema/Exudate Eyes: Sclera non-icteric, conjunctiva clear Neck: Trachea midline.  No JVD.  Very faint right carotid bruit noted on exam Pulmonary:  Good air movement, respirations not labored, equal bilaterally.  Cardiac: RRR, normal S1, S2. Vascular:  Vessel Right Left  Radial Palpable Palpable  Ulnar Palpable Palpable  Brachial Palpable Palpable  Carotid Palpable, without bruit  Palpable, without bruit  Aorta Not palpable N/A  Femoral Palpable Palpable  Popliteal Palpable Palpable  PT Palpable Palpable  DP Palpable Palpable   Gastrointestinal: soft, non-tender/non-distended. No guarding/reflex.  Musculoskeletal: M/S 5/5 throughout.  Extremities without ischemic changes.  No deformity or atrophy.  Neurologic: Some decreased sensation to the right upper and lower extremity. Speech is fluent. Motor exam as listed above. Psychiatric: Judgment intact, Mood & affect appropriate for pt's clinical situation. Dermatologic: No rashes or ulcers noted.  No cellulitis or open wounds. Lymph : No Cervical, Axillary, or Inguinal lymphadenopathy.  CBC Lab Results  Component Value Date   WBC 5.7 02/17/2018   HGB 10.7 (L) 02/17/2018   HCT 32.9 (L) 02/17/2018   MCV 87.4 02/17/2018   PLT 201 02/17/2018   BMET    Component Value Date/Time   NA 142 02/17/2018 1856   NA 139 02/05/2015 0848   K 3.9 02/17/2018 1856   K 4.2 02/05/2015 0848   CL 110 02/17/2018 1856   CL 101 02/05/2015 0848   CO2 26 02/17/2018 1856   CO2 31 02/05/2015 0848   GLUCOSE 114 (H) 02/17/2018 1856   GLUCOSE 92 02/05/2015 0848   BUN 28 (H) 02/17/2018 1856   BUN 23 (H) 02/05/2015 0848   CREATININE 1.22 (H) 02/17/2018 1856   CREATININE 1.05 (H) 02/05/2015 0848   CALCIUM 8.7 (L) 02/17/2018 1856   CALCIUM 9.1 02/05/2015 0848   GFRNONAA 39 (L) 02/17/2018 1856   GFRNONAA 49 (L) 02/05/2015 0848   GFRAA 45 (L) 02/17/2018 1856   GFRAA 57 (L) 02/05/2015 0848   Estimated Creatinine Clearance: 35.7 mL/min (A) (by C-G formula based on SCr of 1.22 mg/dL (H)).  COAG Lab Results  Component Value Date   INR 1.04 02/17/2018   INR 1.00 06/20/2016   INR 1.0 02/05/2015   Radiology Ct Angio Head W Or Wo Contrast  Result Date: 02/17/2018 CLINICAL DATA:  Code stroke. Aphasia. Abnormal head CT with tiny hyperdensity in the left MCA branch region. EXAM: CT ANGIOGRAPHY HEAD TECHNIQUE: Multidetector CT imaging of  the head was performed using the standard protocol during bolus administration of intravenous contrast. Multiplanar CT image reconstructions and MIPs were obtained to evaluate the vascular anatomy. CONTRAST:  60mL  OMNIPAQUE IOHEXOL 350 MG/ML SOLN COMPARISON:  Earlier today.  MR angiography 07/09/2011. FINDINGS: CTA HEAD Anterior circulation: The carotid arteries show extensive peripheral calcification throughout the skull base and siphon region but flow does persist. Stenosis is difficult to measure accurately in this situation, but is probably approximately 50% on both sides. Supraclinoid internal carotid arteries are widely patent. The anterior and middle cerebral vessels are patent without proximal stenosis, aneurysm or vascular malformation. The M2 and M3 branch vessels opacify. The tiny calcification seen at noncontrast head CT is associated with a vessel, but is not occlusive. Posterior circulation: Chronic left vertebral artery occlusion. The right vertebral artery shows atherosclerotic change at the foramen magnum with stenosis of 50%. Distal vertebral artery is widely patent to the basilar. There is retrograde flow to left PICA. The basilar artery shows atherosclerotic irregularity but no critical stenosis. Superior cerebellar and posterior cerebral arteries show flow. Venous sinuses: Patent and normal. Anatomic variants: None significant Delayed phase: No abnormal enhancement. IMPRESSION: No large or medium vessel occlusion. The tiny calcification seen at previous CT is associated with the left M3 branch but is not occlusive. Extensive chronic atherosclerotic calcification throughout both carotid siphon regions with stenosis estimated at 50%. Chronic left vertebral occlusion. 50% stenosis of the right V4 segment. These results were called by telephone at the time of interpretation on 02/17/2018 at 8:18 pm to Dr. Phineas Semen , who verbally acknowledged these results. Electronically Signed   By: Paulina Fusi M.D.   On: 02/17/2018 20:19   Dg Chest 1 View  Result Date: 02/18/2018 CLINICAL DATA:  84 year old with a likely TIA, RIGHT UPPER extremity sensory deficit and weakness along with slurred speech which occurred yesterday, nearly completely resolved. Patient also complained of chest pain and shortness of breath. Current history of hypertension and CHF. EXAM: Portable CHEST 1 VIEW COMPARISON:  03/11/2017, 06/20/2016 and earlier. FINDINGS: Cardiac silhouette moderately enlarged even allowing for AP portable technique, unchanged. Thoracic aorta mildly tortuous and atherosclerotic. Enlarged central pulmonary arteries, RIGHT greater than LEFT, unchanged. Mild diffuse interstitial pulmonary edema, new since the examination 1 year ago. No confluent airspace consolidation. IMPRESSION: Mild CHF, with stable cardiomegaly and mild diffuse interstitial pulmonary edema. Electronically Signed   By: Hulan Saas M.D.   On: 02/18/2018 10:21   US Carotid Bilateral (at [facility] And [facility] Only)  Result Date: 02/18/2018 CLINICAL DATA:  84 year old female with right-sided sensory deficit EXAM: BILATERAL CAROTID DUPLEX ULTRASOUND TECHNIQUE: Wallace Cullens scale imaging, color Doppler and duplex ultrasound were performed of bilateral carotid and vertebral arteries in the neck. COMPARISON:  Prior duplex carotid ultrasound 01/08/2010 FINDINGS: Criteria: Quantification of carotid stenosis is based on velocity parameters that correlate the residual internal carotid diameter with NASCET-based stenosis levels, using the diameter of the distal internal carotid lumen as the denominator for stenosis measurement. The following velocity measurements were obtained: RIGHT ICA:  160/43 cm/sec CCA:  70/8 cm/sec SYSTOLIC ICA/CCA RATIO:  2.3 DIASTOLIC ICA/CCA RATIO:  5.1 ECA:  97 cm/sec LEFT ICA:  86/32 cm/sec CCA:  58/19 cm/sec SYSTOLIC ICA/CCA RATIO:  1.5 DIASTOLIC ICA/CCA RATIO:  1.6 ECA:  139 cm/sec RIGHT CAROTID ARTERY: Bulky, heterogeneous and  partially calcified atherosclerotic plaque in the proximal internal carotid artery. By peak diastolic velocity criteria, the estimated stenosis is greater than 70%. There is nearly a sonographic string sign consistent with a critical stenosis. RIGHT VERTEBRAL ARTERY:  Patent with antegrade flow. LEFT CAROTID ARTERY: Heterogeneous, and irregular atherosclerotic plaque in the common carotid artery extending into the  proximal internal carotid artery. By peak systolic velocity criteria, the estimated stenosis remains less than 50%. LEFT VERTEBRAL ARTERY:  Patent with normal antegrade flow. IMPRESSION: 1. Critical right internal carotid artery stenosis with end-diastolic velocity greater than 40 centimeters/second and a sonographic string sign. 2. Mild (1-49%) stenosis proximal left internal carotid artery secondary to heterogenous atherosclerotic plaque. 3. Vertebral arteries are patent with antegrade flow. These results will be called to the ordering clinician or representative by the Radiologist Assistant, and communication documented in the PACS or zVision Dashboard. Signed, Sterling BigHeath K. McCullough, MD [facility] Electronically Signed   By: Malachy MoanHeath  McCullough M.D.   On: 02/18/2018 08:24   Ct Head Code Stroke Wo Contrast  Result Date: 02/17/2018 CLINICAL DATA:  Code stroke.  Aphasia beginning 4 hours ago. EXAM: CT HEAD WITHOUT CONTRAST TECHNIQUE: Contiguous axial images were obtained from the base of the skull through the vertex without intravenous contrast. COMPARISON:  10/07/2013 CT.  07/09/2011 MRI. FINDINGS: Brain: No abnormality seen affecting the brainstem. Probably old small vessel infarction in the left cerebellum. Cerebral hemispheres show atrophy with chronic small-vessel changes of the white matter. Old right frontal cortical and subcortical infarction. Old left parieto-occipital cortical and subcortical infarction. No sign of acute infarction, mass  lesion, hemorrhage, hydrocephalus or extra-axial collection. Vascular: Question hyperdense left MCA in the insular region. Skull: Normal Sinuses/Orbits: Clear/normal Other: None ASPECTS (Alberta Stroke Program Early CT Score) - Ganglionic level infarction (caudate, lentiform nuclei, internal capsule, insula, M1-M3 cortex): 7 - Supraganglionic infarction (M4-M6 cortex): 3 Total score (0-10 with 10 being normal): 10 IMPRESSION: 1. No acute parenchymal finding by CT at this time. Old right frontal and left parietooccipital infarctions. However, it appears that there is a hyperdense left middle cerebral artery in the insular region. 2. ASPECTS is 10. 3. These results were called by telephone at the time of interpretation on 02/17/2018 at 7:15 pm to Dr. Phineas SemenGRAYDON GOODMAN , who verbally acknowledged these results. Electronically Signed   By: Paulina FusiMark  Shogry M.D.   On: 02/17/2018 19:17   Assessment/Plan The patient is an 84 year old female with a past medical history of chronic diastolic heart failure, hypertension, hyperlipidemia, coronary artery disease, COPD, peripheral vascular disease, carotid stenosis, obstructive sleep apnea who [facility] ED found to have worsening carotid artery stenosis, TIA and possibly CVA.  1.  Carotid artery stenosis: String sign noted to the right internal carotid artery on duplex [facility].  I will order a CTA of the neck to assess the patient's anatomy and exact degree of stenosis in preparation for possible repair.  Repair would be stenting versus open carotid endarterectomy.  Patient is currently on aspirin 325mg .  Would like to start Plavix however the patient has an allergy to this medication. 2.  Hyperlipidemia: On statin.Encouraged good control as its slows the progression of atherosclerotic disease 3.  Hypertension: Encouraged good control as its slows the progression of atherosclerotic disease.  Discussed  with Dr. Weldon Inchesew   A , PA-C  02/18/2018 3:46 PM  This note was created with Dragon medical transcription system.  Any error is purely unintentional.

## 2018-02-18 NOTE — Progress Notes (Signed)
Family Meeting Note  Advance Directive:no  Today a meeting took place with the Patient.and family   The following clinical team members were present during this meeting:MD  The following were discussed:Patient's diagnosis: Sensory deficit concerning for stroke Chronic kidney disease stage III Chronic hypoxic respiratory failure with chronic diastolic heart failure Moderate aortic and mitral valve stenosis , Patient's progosis: Unable to determine and Goals for treatment: Full Code  Additional follow-up to be provided: [place] consultation for advanced directives  Time spent during discussion: 17 minutes  Rose Muston, MD

## 2018-02-18 NOTE — ED Notes (Signed)
Notified by Toney RakesAnn and Lea, RN to give bedside report d/t surge

## 2018-02-18 NOTE — Progress Notes (Signed)
Ch follow-up with patient and family. Patient's daughter has copy of HCPOA. CH requested a copy for records. Patient declined needing to make any changes. CH provided education and support.

## 2018-02-18 NOTE — Consult Note (Signed)
STROKE TEAM PROGRESS NOTE   HPI (From Chart): Tresa ResMaggie G Serratore is a 84 y.o. female who presents to the emergency department today because of concern for change in sensation to her right upper arm. It occurred roughly 3.5 hours prior to my evaluation. She has a hard time describing it but states there is some decreased sensation. In addition the patient has felt like she has been weak in both of her legs. Additionally her speech has been coming and going. She denies any headaches. Denies any fevers. Denies any recent illness. States she has had similar symptoms in the past and was told she had a mini stroke.  SUBJECTIVE (INTERVAL HISTORY) Her family is at bedside.  Patient reports that she feels better, her speech is improved, she still feels generally weak and she has some right-sided sensory deficits.  She takes aspirin 3 times a week at home.  She is also on a statin.   OBJECTIVE Vitals:   02/17/18 2310 02/18/18 0112 02/18/18 0311 02/18/18 0516  BP: 131/68 139/73 (!) 122/59 119/79  Pulse: 73 75 78 69  Resp: 19 16 18 20   Temp:  97.7 F (36.5 C) 98 F (36.7 C) 97.9 F (36.6 C)  TempSrc:  Oral Oral Oral  SpO2: 95% 99% 96% 97%  Weight:  187 lb 14.4 oz (85.2 kg)    Height:  5\' 5"  (1.651 m)      CBC:  Recent Labs  Lab 02/17/18 1856  WBC 5.7  HGB 10.7*  HCT 32.9*  MCV 87.4  PLT 201    Basic Metabolic Panel:  Recent Labs  Lab 02/17/18 1856  NA 142  K 3.9  CL 110  CO2 26  GLUCOSE 114*  BUN 28*  CREATININE 1.22*  CALCIUM 8.7*    Lipid Panel:     Component Value Date/Time   CHOL 169 02/17/2018 1856   TRIG 155 (H) 02/17/2018 1856   HDL 55 02/17/2018 1856   CHOLHDL 3.1 02/17/2018 1856   VLDL 31 02/17/2018 1856   LDLCALC 83 02/17/2018 1856   HgbA1c:  Lab Results  Component Value Date   HGBA1C 5.8 (H) 02/17/2018   Urine Drug Screen:     Component Value Date/Time   LABOPIA NONE DETECTED 02/17/2018 2300   COCAINSCRNUR NONE DETECTED 02/17/2018 2300   LABBENZ NONE  DETECTED 02/17/2018 2300   AMPHETMU NONE DETECTED 02/17/2018 2300   THCU NONE DETECTED 02/17/2018 2300   LABBARB NONE DETECTED 02/17/2018 2300    Alcohol Level No results found for: ETH  IMAGING  Ct Angio Head W Or Wo Contrast  Result Date: 02/17/2018 CLINICAL DATA:  Code stroke. Aphasia. Abnormal head CT with tiny hyperdensity in the left MCA branch region. EXAM: CT ANGIOGRAPHY HEAD TECHNIQUE: Multidetector CT imaging of the head was performed using the standard protocol during bolus administration of intravenous contrast. Multiplanar CT image reconstructions and MIPs were obtained to evaluate the vascular anatomy. CONTRAST:  60mL OMNIPAQUE IOHEXOL 350 MG/ML SOLN COMPARISON:  Earlier today.  MR angiography 07/09/2011. FINDINGS: CTA HEAD Anterior circulation: The carotid arteries show extensive peripheral calcification throughout the skull base and siphon region but flow does persist. Stenosis is difficult to measure accurately in this situation, but is probably approximately 50% on both sides. Supraclinoid internal carotid arteries are widely patent. The anterior and middle cerebral vessels are patent without proximal stenosis, aneurysm or vascular malformation. The M2 and M3 branch vessels opacify. The tiny calcification seen at noncontrast head CT is associated with a vessel, but  is not occlusive. Posterior circulation: Chronic left vertebral artery occlusion. The right vertebral artery shows atherosclerotic change at the foramen magnum with stenosis of 50%. Distal vertebral artery is widely patent to the basilar. There is retrograde flow to left PICA. The basilar artery shows atherosclerotic irregularity but no critical stenosis. Superior cerebellar and posterior cerebral arteries show flow. Venous sinuses: Patent and normal. Anatomic variants: None significant Delayed phase: No abnormal enhancement. IMPRESSION: No large or medium vessel occlusion. The tiny calcification seen at previous CT is  associated with the left M3 branch but is not occlusive. Extensive chronic atherosclerotic calcification throughout both carotid siphon regions with stenosis estimated at 50%. Chronic left vertebral occlusion. 50% stenosis of the right V4 segment. These results were called by telephone at the time of interpretation on 02/17/2018 at 8:18 pm to Dr. Phineas Semen , who verbally acknowledged these results. Electronically Signed   By: Paulina Fusi M.D.   On: 02/17/2018 20:19   Dg Chest 1 View  Result Date: 02/18/2018 CLINICAL DATA:  84 year old with a likely TIA, RIGHT UPPER extremity sensory deficit and weakness along with slurred speech which occurred yesterday, nearly completely resolved. Patient also complained of chest pain and shortness of breath. Current history of hypertension and CHF. EXAM: Portable CHEST 1 VIEW COMPARISON:  03/11/2017, 06/20/2016 and earlier. FINDINGS: Cardiac silhouette moderately enlarged even allowing for AP portable technique, unchanged. Thoracic aorta mildly tortuous and atherosclerotic. Enlarged central pulmonary arteries, RIGHT greater than LEFT, unchanged. Mild diffuse interstitial pulmonary edema, new since the examination 1 year ago. No confluent airspace consolidation. IMPRESSION: Mild CHF, with stable cardiomegaly and mild diffuse interstitial pulmonary edema. Electronically Signed   By: Hulan Saas M.D.   On: 02/18/2018 10:21   US Carotid Bilateral (at [facility] And [facility] Only)  Result Date: 02/18/2018 CLINICAL DATA:  84 year old female with right-sided sensory deficit EXAM: BILATERAL CAROTID DUPLEX ULTRASOUND TECHNIQUE: Wallace Cullens scale imaging, color Doppler and duplex ultrasound were performed of bilateral carotid and vertebral arteries in the neck. COMPARISON:  Prior duplex carotid ultrasound 01/08/2010 FINDINGS: Criteria: Quantification of carotid stenosis is based on velocity parameters that correlate the residual internal carotid diameter with NASCET-based stenosis levels,  using the diameter of the distal internal carotid lumen as the denominator for stenosis measurement. The following velocity measurements were obtained: RIGHT ICA:  160/43 cm/sec CCA:  70/8 cm/sec SYSTOLIC ICA/CCA RATIO:  2.3 DIASTOLIC ICA/CCA RATIO:  5.1 ECA:  97 cm/sec LEFT ICA:  86/32 cm/sec CCA:  58/19 cm/sec SYSTOLIC ICA/CCA RATIO:  1.5 DIASTOLIC ICA/CCA RATIO:  1.6 ECA:  139 cm/sec RIGHT CAROTID ARTERY: Bulky, heterogeneous and partially calcified atherosclerotic plaque in the proximal internal carotid artery. By peak diastolic velocity criteria, the estimated stenosis is greater than 70%. There is nearly a sonographic string sign consistent with a critical stenosis. RIGHT VERTEBRAL ARTERY:  Patent with antegrade flow. LEFT CAROTID ARTERY: Heterogeneous, and irregular atherosclerotic plaque in the common carotid artery extending into the proximal internal carotid artery. By peak systolic velocity criteria, the estimated stenosis remains less than 50%. LEFT VERTEBRAL ARTERY:  Patent with normal antegrade flow. IMPRESSION: 1. Critical right internal carotid artery stenosis with end-diastolic velocity greater than 40 centimeters/second and a sonographic string sign. 2. Mild (1-49%) stenosis proximal left internal carotid artery secondary to heterogenous atherosclerotic plaque. 3. Vertebral arteries are patent with antegrade flow. These results will be called to the ordering clinician or representative by the Radiologist Assistant, and communication documented in the PACS or zVision Dashboard. Signed, Sterling Big, MD  [facility] Electronically Signed   By: Malachy Moan M.D.   On: 02/18/2018 08:24   Ct Head Code Stroke Wo Contrast  Result Date: 02/17/2018 CLINICAL DATA:  Code stroke.  Aphasia beginning 4 hours ago. EXAM: CT HEAD WITHOUT CONTRAST TECHNIQUE: Contiguous axial images were obtained from the base of the skull through the vertex  without intravenous contrast. COMPARISON:  10/07/2013 CT.  07/09/2011 MRI. FINDINGS: Brain: No abnormality seen affecting the brainstem. Probably old small vessel infarction in the left cerebellum. Cerebral hemispheres show atrophy with chronic small-vessel changes of the white matter. Old right frontal cortical and subcortical infarction. Old left parieto-occipital cortical and subcortical infarction. No sign of acute infarction, mass lesion, hemorrhage, hydrocephalus or extra-axial collection. Vascular: Question hyperdense left MCA in the insular region. Skull: Normal Sinuses/Orbits: Clear/normal Other: None ASPECTS (Alberta Stroke Program Early CT Score) - Ganglionic level infarction (caudate, lentiform nuclei, internal capsule, insula, M1-M3 cortex): 7 - Supraganglionic infarction (M4-M6 cortex): 3 Total score (0-10 with 10 being normal): 10 IMPRESSION: 1. No acute parenchymal finding by CT at this time. Old right frontal and left parietooccipital infarctions. However, it appears that there is a hyperdense left middle cerebral artery in the insular region. 2. ASPECTS is 10. 3. These results were called by telephone at the time of interpretation on 02/17/2018 at 7:15 pm to Dr. Phineas Semen , who verbally acknowledged these results. Electronically Signed   By: Paulina Fusi M.D.   On: 02/17/2018 19:17       PHYSICAL EXAM Physical exam: Exam: Gen: NAD, conversant, well nourised,                  CV: RRR, no MRG.  Eyes: Conjunctivae clear without exudates or hemorrhage  Neuro: Detailed Neurologic Exam  Speech:    Speech is normal; fluent and spontaneous with normal comprehension.  Cognition:    The patient is oriented to person, place, and time;  Cranial Nerves:    The pupils are equal, round, and reactive to light.  Attempted funduscopic exam could not visualize.  Visual fields are full to finger confrontation. Extraocular movements are intact. Trigeminal sensation is decreased on the right.  The face is symmetric. The palate elevates in the midline. Hearing intact. Voice is normal. Shoulder shrug is normal. The tongue has normal motion without fasciculations.   Coordination:    Normal finger to nose  Gait:    Not tested  Motor Observation:    No asymmetry, no atrophy, and no involuntary movements noted. Tone:    Normal muscle tone.      Strength:    Patient's limbs appear equal and symmetric, no drift in the upper extremities, some mild proximal weakness in the lower extremities however this is symmetric and bilateral.     Sensation: Decreased sensation right arm and right leg     Reflex Exam:  DTR's:    Deep tendon reflexes in the upper and lower extremities are symmetrical bilaterally.   Toes:    The toes are equivocal bilaterally.   Clonus:    Clonus is absent.    ASSESSMENT/PLAN Ms. AZENETH CARBONELL is a 84 y.o. female with history of A. fib status post ablation, congestive heart failure, hyperlipidemia, hypertension, sleep apnea, TIA presenting with right sided sensory deficits  Stroke: Likely stroke due to embolic etiology given persistence symptoms and possible hyperdense left MCA sign in the insular region.   MRI of the brain would be quite helpful.  Resultant right-sided sensory deficits  CT head old right frontal and left parieto-occipital infarctions.  Hyperdense left middle cerebral artery in the insular region.  CTA of the head: Extensive atherosclerosis bilateral carotid arteries as well as atherosclerosis in the posterior circulation.  Vascular has been consulted, discussed with Dr. Juliene Pina  Carotid Doppler: See CTA of the neck  2D Echo  Pending. May need a TEE.  LDL 83, goal<70 consider increasing dose of statin  HgbA1c 5.8, controlled  ASA 81mg  three times a weekprior to admission, now on ASA 325mg  daily  Patient counseled to be compliant with her antithrombotic medications  Ongoing aggressive stroke risk factor management  Therapy  recommendations:  pending  Disposition:  pending  Hypertension .  Permissive hypertension (OK if < 220/120) but gradually normalize in 5-7 days .  Long-term BP goal normotensive  Hyperlipidemia  LDL 83, goal < 70  Consider increasing statin at discharge  Diabetes type II  HgbA1c 5.9, goal < 7.0  Controlled  Other Stroke Risk Factors  Advanced age  OSA: Needs follow up with sleep physician  Obesity, Body mass index is 31.27 kg/m., recommend weight loss, diet and exercise as appropriate   Hx stroke/TIA  History of A. Fib  Extensive atherosclerosis  Plan  An MRI of the brain would be helpful in this situation, they are going to call the orthopedist who placed the rod to get more information tomorrow.  With that information should call MRI and see if she can have an MRI or not.  If not repeat CT of the head.  Patient may need TEE, she has a history of A. fib status post ablation  Her obstructive sleep apnea needs to be followed up on this is a stroke risk factor.  Vascular consult pending  Thana Farr will follow tomorrow, Monday the 8th  Hospital day # 0  Personally examined patient and images, and have participated in and made any corrections needed to history, physical, neuro exam,assessment and plan as stated above.  I have personally obtained the history, evaluated lab date, reviewed imaging studies and agree with radiology interpretations.    Naomie Dean, MD [facility]    To contact Stroke Continuity provider, please refer to [email or web address]. After hours, contact General Neurology

## 2018-02-18 NOTE — Plan of Care (Signed)
  Problem: Education: Goal: Knowledge of General Education information will improve Outcome: Progressing   Problem: Pain Managment: Goal: General experience of comfort will improve Outcome: Progressing   Problem: Safety: Goal: Ability to remain free from injury will improve Outcome: Progressing   Problem: Skin Integrity: Goal: Risk for impaired skin integrity will decrease Outcome: Progressing   Problem: Education: Goal: Knowledge of disease or condition will improve Outcome: Progressing Goal: Knowledge of secondary prevention will improve Outcome: Progressing Goal: Knowledge of patient specific risk factors addressed and post discharge goals established will improve Outcome: Progressing

## 2018-02-18 NOTE — Care Management Obs Status (Signed)
MEDICARE OBSERVATION STATUS NOTIFICATION   Patient Details  Name: Rose [name] MRN: [id number] Date of Birth: 04/25/1930   Medicare Observation Status Notification Given:  Yes    Eber HongGreene, Umer Harig R, RN 02/18/2018, 2:37 PM

## 2018-02-18 NOTE — Progress Notes (Signed)
[facility] received a order requisition for HCPOA. A copy of HCPOA has been requested according to patient chart notes. [facility] is available for follow-up at the request of patient, family, or staff.

## 2018-02-18 NOTE — Care Management (Signed)
Patient placed in observation for sx concerning for TIA/CVA. Patient presents from home.  Prior to this independent in all her adls. Current with her PCP. No issues with transportation or obtaining medications. PT and OT consults pending.  Found to have critical ICA stenosis and and admitted 4/7. Neuro consult is pending

## 2018-02-18 NOTE — Progress Notes (Signed)
[employer] [facility] [place] [facility]   PATIENT NAME: Shirlean KellyMaggie Moronta    MR#:  [id number]  DATE OF BIRTH:  12/28/1929  SUBJECTIVE:   Patient complaining of inspiratory and expiratory chest pain which has been persistent since yesterday.  No shortness of breath.  No nausea or vomiting.  Patient continues to have some sensory deficit.  No other neurological deficits noted.  Family at bedside.  REVIEW OF SYSTEMS:    Review of Systems  Constitutional: Negative for fever, chills weight loss HENT: Negative for ear pain, nosebleeds, congestion, facial swelling, rhinorrhea, neck pain, neck stiffness and ear discharge.   Respiratory: Negative for cough, shortness of breath, wheezing  Cardiovascular: Positive for chest pain, no palpitations and leg swelling.  Gastrointestinal: Negative for heartburn, abdominal pain, vomiting, diarrhea or consitpation Genitourinary: Negative for dysuria, urgency, frequency, hematuria Musculoskeletal: Negative for back pain or joint pain Neurological: Negative for dizziness, seizures, syncope, focal weakness,   and headaches.  Right upper extremity sensory deficit with weakness slurred speech is resolved Hematological: Does not bruise/bleed easily.  Psychiatric/Behavioral: Negative for hallucinations, confusion, dysphoric mood    Tolerating Diet: yes      DRUG ALLERGIES:   Allergies  Allergen Reactions  . Celecoxib Other (See Comments)    Unknown.  Marland Kitchen. Avapro [Irbesartan] Other (See Comments)    Unknown  . Lisinopril Other (See Comments)    Unknown  . Plavix [Clopidogrel Bisulfate] Other (See Comments)    Unknown  . Statins Other (See Comments)    Unknown  . Ultram [Tramadol Hcl] Other (See Comments)    Unknown    VITALS:  Blood pressure 119/79, pulse 69, temperature 97.9 F (36.6 C), temperature source Oral, resp. rate 20, height 5\' 5"  (1.651 m), weight 85.2 kg (187 lb 14.4 oz), SpO2 97 %.  PHYSICAL EXAMINATION:  Constitutional:  Appears well-developed and well-nourished. No distress. HENT: Normocephalic. Marland Kitchen. Oropharynx is clear and moist.  Eyes: Conjunctivae and EOM are normal. PERRLA, no scleral icterus.  Neck: Normal ROM. Neck supple. No JVD. No tracheal deviation. CVS: RRR, S1/S2 +, 3/6 holosystolic murmur no gallops, no carotid bruit.  Pulmonary: Effort and breath sounds normal, no stridor, rhonchi, wheezes, rales.  Abdominal: Soft. BS +,  no distension, tenderness, rebound or guarding.  Musculoskeletal: Normal range of motion. No edema and no tenderness.  Neuro: Alert. CN 2-12 grossly intact. No focal deficits. Upper extremity strength 4-5 and decreased sensation noted Skin: Skin is warm and dry. No rash noted. Psychiatric: Normal mood and affect.      LABORATORY PANEL:   CBC Recent Labs  Lab 02/17/18 1856  WBC 5.7  HGB 10.7*  HCT 32.9*  PLT 201   ------------------------------------------------------------------------------------------------------------------  Chemistries  Recent Labs  Lab 02/17/18 1856  NA 142  K 3.9  CL 110  CO2 26  GLUCOSE 114*  BUN 28*  CREATININE 1.22*  CALCIUM 8.7*   ------------------------------------------------------------------------------------------------------------------  Cardiac Enzymes Recent Labs  Lab 02/17/18 1856  TROPONINI 0.04*   ------------------------------------------------------------------------------------------------------------------  RADIOLOGY:  Ct Angio Head W Or Wo Contrast  Result Date: 02/17/2018 CLINICAL DATA:  Code stroke. Aphasia. Abnormal head CT with tiny hyperdensity in the left MCA branch region. EXAM: CT ANGIOGRAPHY HEAD TECHNIQUE: Multidetector CT imaging of the head was performed using the standard protocol during bolus administration of intravenous contrast. Multiplanar CT image reconstructions and MIPs were obtained to evaluate the vascular anatomy. CONTRAST:  60mL OMNIPAQUE IOHEXOL 350 MG/ML SOLN COMPARISON:  Earlier  today.  MR angiography 07/09/2011. FINDINGS:  CTA HEAD Anterior circulation: The carotid arteries show extensive peripheral calcification throughout the skull base and siphon region but flow does persist. Stenosis is difficult to measure accurately in this situation, but is probably approximately 50% on both sides. Supraclinoid internal carotid arteries are widely patent. The anterior and middle cerebral vessels are patent without proximal stenosis, aneurysm or vascular malformation. The M2 and M3 branch vessels opacify. The tiny calcification seen at noncontrast head CT is associated with a vessel, but is not occlusive. Posterior circulation: Chronic left vertebral artery occlusion. The right vertebral artery shows atherosclerotic change at the foramen magnum with stenosis of 50%. Distal vertebral artery is widely patent to the basilar. There is retrograde flow to left PICA. The basilar artery shows atherosclerotic irregularity but no critical stenosis. Superior cerebellar and posterior cerebral arteries show flow. Venous sinuses: Patent and normal. Anatomic variants: None significant Delayed phase: No abnormal enhancement. IMPRESSION: No large or medium vessel occlusion. The tiny calcification seen at previous CT is associated with the left M3 branch but is not occlusive. Extensive chronic atherosclerotic calcification throughout both carotid siphon regions with stenosis estimated at 50%. Chronic left vertebral occlusion. 50% stenosis of the right V4 segment. These results were called by telephone at the time of interpretation on 02/17/2018 at 8:18 pm to Dr. Phineas Semen , who verbally acknowledged these results. Electronically Signed   By: Paulina Fusi M.D.   On: 02/17/2018 20:19   US Carotid Bilateral (at [facility] Only)  Result Date: 02/18/2018 CLINICAL DATA:  84 year old female with right-sided sensory deficit EXAM: BILATERAL CAROTID DUPLEX ULTRASOUND TECHNIQUE: Wallace Cullens scale imaging, color Doppler and  duplex ultrasound were performed of bilateral carotid and vertebral arteries in the neck. COMPARISON:  Prior duplex carotid ultrasound 01/08/2010 FINDINGS: Criteria: Quantification of carotid stenosis is based on velocity parameters that correlate the residual internal carotid diameter with NASCET-based stenosis levels, using the diameter of the distal internal carotid lumen as the denominator for stenosis measurement. The following velocity measurements were obtained: RIGHT ICA:  160/43 cm/sec CCA:  70/8 cm/sec SYSTOLIC ICA/CCA RATIO:  2.3 DIASTOLIC ICA/CCA RATIO:  5.1 ECA:  97 cm/sec LEFT ICA:  86/32 cm/sec CCA:  58/19 cm/sec SYSTOLIC ICA/CCA RATIO:  1.5 DIASTOLIC ICA/CCA RATIO:  1.6 ECA:  139 cm/sec RIGHT CAROTID ARTERY: Bulky, heterogeneous and partially calcified atherosclerotic plaque in the proximal internal carotid artery. By peak diastolic velocity criteria, the estimated stenosis is greater than 70%. There is nearly a sonographic string sign consistent with a critical stenosis. RIGHT VERTEBRAL ARTERY:  Patent with antegrade flow. LEFT CAROTID ARTERY: Heterogeneous, and irregular atherosclerotic plaque in the common carotid artery extending into the proximal internal carotid artery. By peak systolic velocity criteria, the estimated stenosis remains less than 50%. LEFT VERTEBRAL ARTERY:  Patent with normal antegrade flow. IMPRESSION: 1. Critical right internal carotid artery stenosis with end-diastolic velocity greater than 40 centimeters/second and a sonographic string sign. 2. Mild (1-49%) stenosis proximal left internal carotid artery secondary to heterogenous atherosclerotic plaque. 3. Vertebral arteries are patent with antegrade flow. These results will be called to the ordering clinician or representative by the Radiologist Assistant, and communication documented in the PACS or zVision Dashboard. Signed, Sterling Big, MD [facility]  Electronically Signed   By: Malachy Moan M.D.   On: 02/18/2018 08:24   Ct Head Code Stroke Wo Contrast  Result Date: 02/17/2018 CLINICAL DATA:  Code stroke.  Aphasia beginning 4 hours ago. EXAM: CT HEAD WITHOUT CONTRAST  TECHNIQUE: Contiguous axial images were obtained from the base of the skull through the vertex without intravenous contrast. COMPARISON:  10/07/2013 CT.  07/09/2011 MRI. FINDINGS: Brain: No abnormality seen affecting the brainstem. Probably old small vessel infarction in the left cerebellum. Cerebral hemispheres show atrophy with chronic small-vessel changes of the white matter. Old right frontal cortical and subcortical infarction. Old left parieto-occipital cortical and subcortical infarction. No sign of acute infarction, mass lesion, hemorrhage, hydrocephalus or extra-axial collection. Vascular: Question hyperdense left MCA in the insular region. Skull: Normal Sinuses/Orbits: Clear/normal Other: None ASPECTS (Alberta Stroke Program Early CT Score) - Ganglionic level infarction (caudate, lentiform nuclei, internal capsule, insula, M1-M3 cortex): 7 - Supraganglionic infarction (M4-M6 cortex): 3 Total score (0-10 with 10 being normal): 10 IMPRESSION: 1. No acute parenchymal finding by CT at this time. Old right frontal and left parietooccipital infarctions. However, it appears that there is a hyperdense left middle cerebral artery in the insular region. 2. ASPECTS is 10. 3. These results were called by telephone at the time of interpretation on 02/17/2018 at 7:15 pm to Dr. Phineas Semen , who verbally acknowledged these results. Electronically Signed   By: Paulina Fusi M.D.   On: 02/17/2018 19:17     ASSESSMENT AND PLAN:   84 year old female with history of chronic diastolic heart failure and preserved ejection fraction, chronic kidney disease stage III and nonrheumatic mitral valve stenosis/aortic stenosis who presents with sensory changes of the right upper extremity and speech  difficulty.  1.  Right upper extremity sensory changes concerning for CVA Unable to obtain MRI as patient has metal in her back Change aspirin 81 to aspirin 325 mg daily Continue statin therapy LDL 83 A1c 5.8 Carotid ultrasound concerning for critical ICA stenosis Vascular neurology consultation requested Follow-up on today's echocardiogram Consider CT head tomorrow which would be about 48 hours after symptoms started. PT, OT consultation pending Does not need speech evaluation  2.  Chest pain: This sounds like it was related to the pancake she ate for breakfast GI cocktail ordered EKG ordered this morning shows no ST elevation MI Follow troponins and telemetry  3.  Essential hypertension: Continue isosorbide  4.  Chronic hypoxic respiratory failure with chronic diastolic heart failure without signs of exacerbation Continue Lasix  5.  Chronic kidney disease stage III: Creatinine at baseline  6.  Heart murmur: Patient is followed by Dr. Juliann Pares Management plans discussed with the patient and family and they are in agreement.  CODE STATUS: FULl  TOTAL TIME TAKING CARE OF THIS PATIENT: 33 minutes.     POSSIBLE D/C tomrrow, DEPENDING ON CLINICAL CONDITION.   Shaterrica Territo M.D on 02/18/2018 at 10:14 AM  Between 7am to 6pm - Pager - [phone] After 6pm go to [email or web address] - password [email or web address]  [facility] [place]  Office  [phone]  CC: Primary care physician; Pennie Banter, MD  Note: This dictation was prepared with Dragon dictation along with smaller phrase technology. Any transcriptional errors that result from this process are unintentional.

## 2018-02-19 DIAGNOSIS — G459 Transient cerebral ischemic attack, unspecified: Secondary | ICD-10-CM

## 2018-02-19 LAB — BASIC METABOLIC PANEL
ANION GAP: 6 (ref 5–15)
BUN: 25 mg/dL — ABNORMAL HIGH (ref 6–20)
CO2: 27 mmol/L (ref 22–32)
Calcium: 8.6 mg/dL — ABNORMAL LOW (ref 8.9–10.3)
Chloride: 106 mmol/L (ref 101–111)
Creatinine, Ser: 1.35 mg/dL — ABNORMAL HIGH (ref 0.44–1.00)
GFR calc non Af Amer: 34 mL/min — ABNORMAL LOW (ref >60)
GFR, EST AFRICAN AMERICAN: 40 mL/min — AB (ref >60)
GLUCOSE: 106 mg/dL — AB (ref 65–99)
Potassium: 4.1 mmol/L (ref 3.5–5.1)
Sodium: 139 mmol/L (ref 135–145)

## 2018-02-19 LAB — CBC
HCT: 32.6 % — ABNORMAL LOW (ref 35.0–47.0)
Hemoglobin: 10.5 g/dL — ABNORMAL LOW (ref 12.0–16.0)
MCH: 28.2 pg (ref 26.0–34.0)
MCHC: 32.3 g/dL (ref 32.0–36.0)
MCV: 87.3 fL (ref 80.0–100.0)
PLATELETS: 211 10*3/uL (ref 150–440)
RBC: 3.73 MIL/uL — ABNORMAL LOW (ref 3.80–5.20)
RDW: 15.4 % — ABNORMAL HIGH (ref 11.5–14.5)
WBC: 6.6 10*3/uL (ref 3.6–11.0)

## 2018-02-19 LAB — ECHOCARDIOGRAM COMPLETE
HEIGHTINCHES: 65 in
WEIGHTICAEL: 3006.4 [oz_av]

## 2018-02-19 MED ORDER — ATORVASTATIN CALCIUM 20 MG PO TABS
20.0000 mg | ORAL_TABLET | Freq: Every day | ORAL | Status: DC
  Administered 2018-02-19: 20:00:00 20 mg via ORAL
  Filled 2018-02-19: qty 1

## 2018-02-19 MED ORDER — ASPIRIN EC 81 MG PO TBEC
81.0000 mg | DELAYED_RELEASE_TABLET | Freq: Every day | ORAL | Status: DC
Start: 2018-02-19 — End: 2018-02-20
  Administered 2018-02-20: 81 mg via ORAL
  Filled 2018-02-19: qty 1

## 2018-02-19 MED ORDER — TICAGRELOR 90 MG PO TABS
90.0000 mg | ORAL_TABLET | Freq: Two times a day (BID) | ORAL | Status: DC
  Administered 2018-02-19 – 2018-02-20 (×3): 90 mg via ORAL
  Filled 2018-02-19 (×4): qty 1

## 2018-02-19 NOTE — Care Management Important Message (Signed)
Important Message  Patient Details  Name: Tresa ResMaggie G Towery MRN: [id number] Date of Birth: 03/15/1930   Medicare Important Message Given:  Yes  Signed IM notice given   Eber HongGreene, Juleen Sorrels R, RN 02/19/2018, 9:24 AM

## 2018-02-19 NOTE — Evaluation (Signed)
Physical Therapy Evaluation Patient Details Name: Rose ResMaggie G Wilhite MRN: [id number] DOB: 06/08/1930 Today's Date: 02/19/2018   History of Present Illness  presented to ER secondary to R UE/LE sensory changes, weakness and slurred speech; admitted for TIA/CVA work-up.  Initial CT head negative for acute change (noted for chronic infarcts R frontal, L parietoccipital); MRI pending.  Clinical Impression  Upon evaluation, patient alert and oriented; follows all commands and demonstrates fair/good insight into safety needs.  Mild weakness (R LE > UE) and sensory deficits (sensation generally decreased compared to L UE, but localizes appropriately, no extinction) persist in R hemibody; able to utilize functionally without significant difficulty.  Completes bed mobility with mod indep; sit/stand, basic transfers and gait (60') with RW, cga.  All movement slow and deliberate, but no overt buckling or LOB noted.  Does require UE support for functional reach beyond 2-3" or for any activities requiring altered/narrowed BOS. Would benefit from skilled PT to address above deficits and promote optimal return to PLOF; Recommend transition to HHPT upon discharge from acute hospitalization.     Follow Up Recommendations Home health PT    Equipment Recommendations  (has (and prefers) [id number])    Recommendations for Other Services       Precautions / Restrictions Precautions Precautions: Fall Restrictions Weight Bearing Restrictions: No      Mobility  Bed Mobility Overal bed mobility: Modified Independent             General bed mobility comments: deferred, up in recliner for start and end of session  Transfers Overall transfer level: Needs assistance Equipment used: Rolling walker (2 wheeled) Transfers: Sit to/from Stand Sit to Stand: Min guard;Supervision         General transfer comment: cuing for hand placement; fair/good LE strength/stability with movement  transition  Ambulation/Gait Ambulation/Gait assistance: Min guard Ambulation Distance (Feet): 60 Feet Assistive device: Rolling walker (2 wheeled)       General Gait Details: reciprocal stepping pattern with fair step height/length; slow, deliberate cadence, but no overt buckling or LOB.  Maintains R UE grasp on RW without difficulty.  Stairs            Wheelchair Mobility    Modified Rankin (Stroke Patients Only)       Balance Overall balance assessment: Needs assistance Sitting-balance support: No upper extremity supported;Feet supported Sitting balance-Leahy Scale: Good     Standing balance support: Bilateral upper extremity supported Standing balance-Leahy Scale: Fair Standing balance comment: standing functional reach 2-3" from immediate BOS; generally fearful of weight shift outside immediate BOS Single Leg Stance - Right Leg: 0(requires UE support for any activities with altered/narrowed BOS) Single Leg Stance - Left Leg: 0(requires UE support for any activities with altered/narrowed BOS)                         Pertinent Vitals/Pain Pain Assessment: No/denies pain    Home Living Family/patient expects to be discharged to:: Private residence Living Arrangements: Children Available Help at Discharge: Family;Available 24 hours/day Type of Home: House Home Access: Level entry     Home Layout: One level Home Equipment: Shower seat;Grab bars - tub/shower;Other (comment)      Prior Function Level of Independence: Independent with assistive device(s)         Comments: Pt ambulates with rollator when she feels she is less steady, indep with ADL, medication mgt, and some cooking/cleaning. Pt reports no falls in past 12 months. Pt reports wearing 3L  O2 only at night, no difficulties managing home O2.     Hand Dominance   Dominant Hand: Right    Extremity/Trunk Assessment   Upper Extremity Assessment Upper Extremity Assessment: (bilat UEs  grossly 4-/5 throughout, persistent paresthesia  R UE (able to localize appropriately, no extinction).  No coordination deficits appreciated) RUE Deficits / Details: impaired sensation noted with testing, coordination intact with testing RUE Sensation: decreased light touch    Lower Extremity Assessment Lower Extremity Assessment: (L LE grossly 4+/5, R LE grossly 4-/5; persistent paresthesia  R LE (able to localize appropriately, no extinction)) RLE Deficits / Details: impaired sensation noted with testing, coordination intact with testing RLE Sensation: decreased light touch    Cervical / Trunk Assessment Cervical / Trunk Assessment: Normal  Communication   Communication: No difficulties  Cognition Arousal/Alertness: Awake/alert Behavior During Therapy: WFL for tasks assessed/performed Overall Cognitive Status: Within Functional Limits for tasks assessed                                        General Comments      Exercises     Assessment/Plan    PT Assessment Patient needs continued PT services  PT Problem List Decreased strength;Decreased activity tolerance;Decreased balance;Decreased mobility;Decreased knowledge of use of DME;Decreased safety awareness;Decreased knowledge of precautions;Cardiopulmonary status limiting activity       PT Treatment Interventions DME instruction;Gait training;Functional mobility training;Therapeutic activities;Patient/family education;Therapeutic exercise;Balance training;Neuromuscular re-education    PT Goals (Current goals can be found in the Care Plan section)  Acute Rehab PT Goals Patient Stated Goal: to go home PT Goal Formulation: With patient/family Time For Goal Achievement: 03/05/18 Potential to Achieve Goals: Good    Frequency Min 2X/week   Barriers to discharge        Co-evaluation               AM-PAC PT "6 Clicks" Daily Activity  Outcome Measure Difficulty turning over in bed (including adjusting  bedclothes, sheets and blankets)?: None Difficulty moving from lying on back to sitting on the side of the bed? : None Difficulty sitting down on and standing up from a chair with arms (e.g., wheelchair, bedside commode, etc,.)?: A Little Help needed moving to and from a bed to chair (including a wheelchair)?: A Little Help needed walking in hospital room?: A Little Help needed climbing 3-5 steps with a railing? : A Little 6 Click Score: 20    End of Session Equipment Utilized During Treatment: Gait belt Activity Tolerance: Patient tolerated treatment well Patient left: in bed;with call bell/phone within reach;with bed alarm set;with family/visitor present Nurse Communication: Mobility status PT Visit Diagnosis: Difficulty in walking, not elsewhere classified (R26.2);Muscle weakness (generalized) (M62.81);Hemiplegia and hemiparesis Hemiplegia - Right/Left: Right Hemiplegia - dominant/non-dominant: Dominant Hemiplegia - caused by: Other cerebrovascular disease    Time: 0932-3557 PT Time Calculation (min) (ACUTE ONLY): 18 min   Charges:   PT Evaluation $PT Eval Moderate Complexity: 1 Mod PT Treatments $Therapeutic Activity: 8-22 mins   PT G Codes:        Chinmay Squier H. Manson Passey, PT, DPT, NCS 02/19/18, 2:23 PM [phone]

## 2018-02-19 NOTE — Progress Notes (Signed)
[facility]  Daily Progress Note   After review of the CTA of the neck, Dr. Wyn Quakerew would like to proceed with a right carotid endarterectomy in 1-2 weeks.  The patient should be discharged home on aspirin 81mg  daily and Brilinta 90mg  twice daily as patient is allergic to Plavix.  The patient will need cardiac clearance before surgery.  Thank you.  Vascular surgery will sign off at this time.  Cleda DaubKimberly Haeden Hudock PA-C 02/19/2018 3:20 PM

## 2018-02-19 NOTE — Evaluation (Signed)
Occupational Therapy Evaluation Patient Details Name: Rose [name] MRN: [id number] DOB: Oct 14, 1930 Today's Date: 02/19/2018    History of Present Illness Pt is an 84 year old female with a PMHx significant for chronic diastolic heart failure, HTN, HLD, CAD, COPD, peripheral vascular disease, carotid stenosis, and obstructive sleep apnea who presented to the ED on 4/6 with concerns regarding acute right upper extremity weakness, bilateral lower extremity weakness, and intermittent aphasia   Clinical Impression   Pt seen for OT evaluation this date. At baseline, pt is relatively independent, using a rollator for mobility a large majority of the time, preparing simple meals, and independent with all ADL. Pt notes no falls in past 12 months and no difficulty with medication mgt. Pt currently presents with deficits in strength and balance globally and impaired R side sensation (LE, UE, and L side of face) which are impairing her ability to perform functional mobility and self care tasks at safe PLOF. Pt notes no difficulty with swallowing/chewing/drinking at meals. Pt instructed in skin/joint protection strategies to minimize risk of injury to the RUE and RLE. Pt will benefit from skilled OT services to address noted impairments and functional deficits including bathing, dressing, and toileting, in order to maximize return to PLOF, minimize falls risk, and minimize caregiver burden. Recommend HHOT services following this hospitalization.     Follow Up Recommendations  Home health OT    Equipment Recommendations  None recommended by OT    Recommendations for Other Services       Precautions / Restrictions Precautions Precautions: Fall Restrictions Weight Bearing Restrictions: No      Mobility Bed Mobility               General bed mobility comments: deferred, up in recliner for start and end of session  Transfers Overall transfer level: Needs assistance Equipment used: 1 person  hand held assist Transfers: Sit to/from Stand Sit to Stand: Min guard              Balance Overall balance assessment: Needs assistance Sitting-balance support: Feet supported;No upper extremity supported Sitting balance-Leahy Scale: Good       Standing balance-Leahy Scale: Fair Standing balance comment: fair+                           ADL either performed or assessed with clinical judgement   ADL Overall ADL's : Needs assistance/impaired Eating/Feeding: Sitting;Independent   Grooming: Sitting;Independent   Upper Body Bathing: Sitting;Minimal assistance   Lower Body Bathing: Sit to/from stand;Minimal assistance   Upper Body Dressing : Sitting;Supervision/safety   Lower Body Dressing: Sit to/from stand;Minimal assistance   Toilet Transfer: Comfort height toilet;Ambulation;Min guard;Minimal assistance           Functional mobility during ADLs: Min guard;Minimal assistance       Vision Baseline Vision/History: Wears glasses Wears Glasses: At all times Patient Visual Report: No change from baseline Vision Assessment?: No apparent visual deficits     Perception     Praxis      Pertinent Vitals/Pain Pain Assessment: No/denies pain     Hand Dominance Right   Extremity/Trunk Assessment Upper Extremity Assessment Upper Extremity Assessment: Generalized weakness;RUE deficits/detail(grossly 4-/5 shoulders, 4/5 grip bilaterally) RUE Deficits / Details: impaired sensation noted with testing, coordination intact with testing RUE Sensation: decreased light touch   Lower Extremity Assessment Lower Extremity Assessment: Defer to PT evaluation;Generalized weakness;RLE deficits/detail(at least 4-/5 bilaterally) RLE Deficits / Details: impaired sensation noted with testing,  coordination intact with testing RLE Sensation: decreased light touch   Cervical / Trunk Assessment Cervical / Trunk Assessment: Normal   Communication  Communication Communication: No difficulties   Cognition Arousal/Alertness: Awake/alert Behavior During Therapy: WFL for tasks assessed/performed Overall Cognitive Status: Within Functional Limits for tasks assessed                                     General Comments       Exercises     Shoulder Instructions      Home Living Family/patient expects to be discharged to:: Private residence Living Arrangements: Children(daughter, son in law, and adult grandson) Available Help at Discharge: Family;Available 24 hours/day Type of Home: House Home Access: Level entry     Home Layout: One level     Bathroom Shower/Tub: Chief Strategy OfficerTub/shower unit   Bathroom Toilet: Handicapped height     Home Equipment: Shower seat;Grab bars - tub/shower;Other (comment)(rollator)          Prior Functioning/Environment Level of Independence: Independent with assistive device(s)        Comments: Pt ambulates with rollator when she feels she is less steady, indep with ADL, medication mgt, and some cooking/cleaning. Pt reports no falls in past 12 months. Pt reports wearing 3L O2 only at night, no difficulties managing home O2.        OT Problem List: Decreased strength;Decreased knowledge of use of DME or AE;Impaired balance (sitting and/or standing);Impaired sensation      OT Treatment/Interventions: Self-care/ADL training;Balance training;Therapeutic exercise;Therapeutic activities;DME and/or AE instruction;Patient/family education    OT Goals(Current goals can be found in the care plan section) Acute Rehab OT Goals Patient Stated Goal: to go home OT Goal Formulation: With patient/family Time For Goal Achievement: 02/26/18 Potential to Achieve Goals: Good ADL Goals Pt Will Perform Lower Body Dressing: with modified independence;sit to/from stand Pt Will Transfer to Toilet: with supervision;ambulating(LRAD for amb, comfort height toilet) Additional ADL Goal #1: Pt will demo use of  joint/skin protection strategies for RUE/RLE to minimize risk of injury during functional mobility and self care tasks, 5/5 opportunities.  OT Frequency: Min 1X/week   Barriers to D/C:            Co-evaluation              AM-PAC PT "6 Clicks" Daily Activity     Outcome Measure Help from another person eating meals?: None Help from another person taking care of personal grooming?: None Help from another person toileting, which includes using toliet, bedpan, or urinal?: A Little Help from another person bathing (including washing, rinsing, drying)?: A Little Help from another person to put on and taking off regular upper body clothing?: A Little Help from another person to put on and taking off regular lower body clothing?: A Little 6 Click Score: 20   End of Session Equipment Utilized During Treatment: Gait belt Nurse Communication: Other (comment)(removed O2, Ot sats >96% on RA)  Activity Tolerance: Patient tolerated treatment well Patient left: in chair;with call bell/phone within reach;with chair alarm set;with family/visitor present  OT Visit Diagnosis: Other abnormalities of gait and mobility (R26.89);Other symptoms and signs involving cognitive function                Time: [id number] OT Time Calculation (min): 17 min Charges:  OT General Charges $OT Visit: 1 Visit OT Evaluation $OT Eval Low Complexity: 1 Low OT Treatments $Self Care/Home Management :  8-22 mins  Richrd Prime, MPH, MS, OTR/L ascom [phone] 02/19/18, 11:23 AM

## 2018-02-19 NOTE — Progress Notes (Signed)
OT Cancellation Note  Patient Details Name: Tresa ResMaggie G Zeidman MRN: [id number] DOB: 02/09/1930   Cancelled Treatment:    Reason Eval/Treat Not Completed: Other (comment). Order received, chart reviewed. MD in room to assess pt. Will re-attempt OT evaluation at later time as pt is available and medically appropriate.  Richrd PrimeJamie Stiller, MPH, MS, OTR/L ascom [phone] 02/19/18, 9:08 AM

## 2018-02-19 NOTE — Progress Notes (Signed)
[employer] [facility] [place] [facility]   PATIENT NAME: Rose [name]    MR#:  [id number]  DATE OF BIRTH:  07-19-1930  SUBJECTIVE:  CHIEF COMPLAINT:   Chief Complaint  Patient presents with  . Weakness   - concern for stroke, right carotid occlusion - feels about the same  REVIEW OF SYSTEMS:  ROS  DRUG ALLERGIES:   Allergies  Allergen Reactions  . Celecoxib Other (See Comments)    Unknown.  Marland Kitchen Avapro [Irbesartan] Other (See Comments)    Unknown  . Lisinopril Other (See Comments)    Unknown  . Plavix [Clopidogrel Bisulfate] Other (See Comments)    Unknown  . Statins Other (See Comments)    Unknown  . Ultram [Tramadol Hcl] Other (See Comments)    Unknown    VITALS:  Blood pressure 127/76, pulse 80, temperature 98.6 F (37 C), temperature source Oral, resp. rate 16, height 5\' 5"  (1.651 m), weight 85.2 kg (187 lb 14.4 oz), SpO2 97 %.  PHYSICAL EXAMINATION:  Physical Exam  GENERAL:  84 y.o.-year-old patient lying in the bed with no acute distress.  EYES: Pupils equal, round, reactive to light and accommodation. No scleral icterus. Extraocular muscles intact.  HEENT: Head atraumatic, normocephalic. Oropharynx and nasopharynx clear.  NECK:  Supple, no jugular venous distention. No thyroid enlargement, no tenderness.  LUNGS: Normal breath sounds bilaterally, no wheezing, rales,rhonchi or crepitation. No use of accessory muscles of respiration.  CARDIOVASCULAR: S1, S2 normal. No murmurs, rubs, or gallops.  ABDOMEN: Soft, nontender, nondistended. Bowel sounds present. No organomegaly or mass.  EXTREMITIES: No pedal edema, cyanosis, or clubbing.  NEUROLOGIC: Cranial nerves II through XII are intact. Muscle strength 5/5 in all extremities. Sensation intact except slightly decreased on the right side. Gait not checked.  PSYCHIATRIC: The patient is alert and oriented x 3.  SKIN: No obvious rash, lesion, or ulcer.    LABORATORY PANEL:   CBC Recent Labs  Lab  02/19/18 0405  WBC 6.6  HGB 10.5*  HCT 32.6*  PLT 211   ------------------------------------------------------------------------------------------------------------------  Chemistries  Recent Labs  Lab 02/19/18 0405  NA 139  K 4.1  CL 106  CO2 27  GLUCOSE 106*  BUN 25*  CREATININE 1.35*  CALCIUM 8.6*   ------------------------------------------------------------------------------------------------------------------  Cardiac Enzymes Recent Labs  Lab 02/18/18 2116  TROPONINI 0.03*   ------------------------------------------------------------------------------------------------------------------  RADIOLOGY:  Ct Angio Head W Or Wo Contrast  Result Date: 02/17/2018 CLINICAL DATA:  Code stroke. Aphasia. Abnormal head CT with tiny hyperdensity in the left MCA branch region. EXAM: CT ANGIOGRAPHY HEAD TECHNIQUE: Multidetector CT imaging of the head was performed using the standard protocol during bolus administration of intravenous contrast. Multiplanar CT image reconstructions and MIPs were obtained to evaluate the vascular anatomy. CONTRAST:  60mL OMNIPAQUE IOHEXOL 350 MG/ML SOLN COMPARISON:  Earlier today.  MR angiography 07/09/2011. FINDINGS: CTA HEAD Anterior circulation: The carotid arteries show extensive peripheral calcification throughout the skull base and siphon region but flow does persist. Stenosis is difficult to measure accurately in this situation, but is probably approximately 50% on both sides. Supraclinoid internal carotid arteries are widely patent. The anterior and middle cerebral vessels are patent without proximal stenosis, aneurysm or vascular malformation. The M2 and M3 branch vessels opacify. The tiny calcification seen at noncontrast head CT is associated with a vessel, but is not occlusive. Posterior circulation: Chronic left vertebral artery occlusion. The right vertebral artery shows atherosclerotic change at the foramen magnum with stenosis of 50%. Distal  vertebral  artery is widely patent to the basilar. There is retrograde flow to left PICA. The basilar artery shows atherosclerotic irregularity but no critical stenosis. Superior cerebellar and posterior cerebral arteries show flow. Venous sinuses: Patent and normal. Anatomic variants: None significant Delayed phase: No abnormal enhancement. IMPRESSION: No large or medium vessel occlusion. The tiny calcification seen at previous CT is associated with the left M3 branch but is not occlusive. Extensive chronic atherosclerotic calcification throughout both carotid siphon regions with stenosis estimated at 50%. Chronic left vertebral occlusion. 50% stenosis of the right V4 segment. These results were called by telephone at the time of interpretation on 02/17/2018 at 8:18 pm to Dr. Phineas Semen , who verbally acknowledged these results. Electronically Signed   By: Paulina Fusi M.D.   On: 02/17/2018 20:19   Dg Chest 1 View  Result Date: 02/18/2018 CLINICAL DATA:  84 year old with a likely TIA, RIGHT UPPER extremity sensory deficit and weakness along with slurred speech which occurred yesterday, nearly completely resolved. Patient also complained of chest pain and shortness of breath. Current history of hypertension and CHF. EXAM: Portable CHEST 1 VIEW COMPARISON:  03/11/2017, 06/20/2016 and earlier. FINDINGS: Cardiac silhouette moderately enlarged even allowing for AP portable technique, unchanged. Thoracic aorta mildly tortuous and atherosclerotic. Enlarged central pulmonary arteries, RIGHT greater than LEFT, unchanged. Mild diffuse interstitial pulmonary edema, new since the examination 1 year ago. No confluent airspace consolidation. IMPRESSION: Mild CHF, with stable cardiomegaly and mild diffuse interstitial pulmonary edema. Electronically Signed   By: Hulan Saas M.D.   On: 02/18/2018 10:21   Ct Angio Neck W Or Wo Contrast  Result Date: 02/18/2018 CLINICAL DATA:  Follow-up carotid stenosis. EXAM: CT  ANGIOGRAPHY NECK TECHNIQUE: Multidetector CT imaging of the neck was performed using the standard protocol during bolus administration of intravenous contrast. Multiplanar CT image reconstructions and MIPs were obtained to evaluate the vascular anatomy. Carotid stenosis measurements (when applicable) are obtained utilizing NASCET criteria, using the distal internal carotid diameter as the denominator. CONTRAST:  75mL OMNIPAQUE IOHEXOL 350 MG/ML SOLN COMPARISON:  Ultrasound earlier today. FINDINGS: Aortic arch: Aortic atherosclerosis. No aneurysm or dissection. Branching pattern of the brachiocephalic vessels from the arch is normal. 40-50% stenosis of the left subclavian artery origin. Right carotid system: Common carotid artery widely patent to the bifurcation region. Dense calcified plaque at the proximal ICA and through the ICA bulb. Luminal diameter 1 mm or less. Compared to an expected distal ICA diameter of 5 mm, this indicates a stenosis of 80% or possibly greater. In this case, the cervical ICA is small because of reduced inflow. Note that the vessels are tortuous in the carotid bifurcations are somewhat medial to the normal position, near the posterior hypopharynx. Left carotid system: Common carotid artery shows some plaque but is widely patent to the bifurcation. Calcified plaque at the carotid bifurcation and proximal ICA. Minimal diameter in the ICA bulb is approximately 3 mm. Compared to a more distal cervical ICA diameter of 5 mm, this indicates a 40% stenosis. Pronounced vessel tortuosity on this side as well. External carotid artery origin is severely stenotic. Vertebral arteries: Right vertebral artery is dominant. There is atherosclerotic plaque at the right vertebral artery origin with stenosis estimated at 70%. The vessel is widely patent beyond that through the cervical region. The small left vertebral artery shows wide patency at its origin and through the cervical region. Skeleton: Advanced  cervical spondylosis. Other neck: No soft tissue mass or lymphadenopathy. Upper chest: Interstitial and alveolar pulmonary edema.  Bilateral pleural effusions layering dependently. IMPRESSION: Advanced/extensive calcified plaque at the right carotid bifurcation and ICA bulb region. Patent luminal diameter 1 mm or less, consistent with 80% or greater stenosis. Distal ICA shows a small caliber secondary to reduced inflow. Atherosclerotic calcification at the left carotid bifurcation and ICA bulb. Maximal stenosis at the distal bulb level measures 40%. Severe stenosis of the proximal external carotid artery. Both carotid systems are markedly tortuous, the bifurcations being more medially situated than normal, close to the posterior aspect of the hypopharynx. Interstitial and alveolar pulmonary edema with layering effusions. Electronically Signed   By: Paulina Fusi M.D.   On: 02/18/2018 21:15   US Carotid Bilateral (at [facility] And [facility] Only)  Result Date: 02/18/2018 CLINICAL DATA:  84 year old female with right-sided sensory deficit EXAM: BILATERAL CAROTID DUPLEX ULTRASOUND TECHNIQUE: Wallace Cullens scale imaging, color Doppler and duplex ultrasound were performed of bilateral carotid and vertebral arteries in the neck. COMPARISON:  Prior duplex carotid ultrasound 01/08/2010 FINDINGS: Criteria: Quantification of carotid stenosis is based on velocity parameters that correlate the residual internal carotid diameter with NASCET-based stenosis levels, using the diameter of the distal internal carotid lumen as the denominator for stenosis measurement. The following velocity measurements were obtained: RIGHT ICA:  160/43 cm/sec CCA:  70/8 cm/sec SYSTOLIC ICA/CCA RATIO:  2.3 DIASTOLIC ICA/CCA RATIO:  5.1 ECA:  97 cm/sec LEFT ICA:  86/32 cm/sec CCA:  58/19 cm/sec SYSTOLIC ICA/CCA RATIO:  1.5 DIASTOLIC ICA/CCA RATIO:  1.6 ECA:  139 cm/sec RIGHT CAROTID ARTERY: Bulky, heterogeneous and partially calcified atherosclerotic plaque in the  proximal internal carotid artery. By peak diastolic velocity criteria, the estimated stenosis is greater than 70%. There is nearly a sonographic string sign consistent with a critical stenosis. RIGHT VERTEBRAL ARTERY:  Patent with antegrade flow. LEFT CAROTID ARTERY: Heterogeneous, and irregular atherosclerotic plaque in the common carotid artery extending into the proximal internal carotid artery. By peak systolic velocity criteria, the estimated stenosis remains less than 50%. LEFT VERTEBRAL ARTERY:  Patent with normal antegrade flow. IMPRESSION: 1. Critical right internal carotid artery stenosis with end-diastolic velocity greater than 40 centimeters/second and a sonographic string sign. 2. Mild (1-49%) stenosis proximal left internal carotid artery secondary to heterogenous atherosclerotic plaque. 3. Vertebral arteries are patent with antegrade flow. These results will be called to the ordering clinician or representative by the Radiologist Assistant, and communication documented in the PACS or zVision Dashboard. Signed, Sterling Big, MD [facility] Electronically Signed   By: Malachy Moan M.D.   On: 02/18/2018 08:24   Ct Head Code Stroke Wo Contrast  Result Date: 02/17/2018 CLINICAL DATA:  Code stroke.  Aphasia beginning 4 hours ago. EXAM: CT HEAD WITHOUT CONTRAST TECHNIQUE: Contiguous axial images were obtained from the base of the skull through the vertex without intravenous contrast. COMPARISON:  10/07/2013 CT.  07/09/2011 MRI. FINDINGS: Brain: No abnormality seen affecting the brainstem. Probably old small vessel infarction in the left cerebellum. Cerebral hemispheres show atrophy with chronic small-vessel changes of the white matter. Old right frontal cortical and subcortical infarction. Old left parieto-occipital cortical and subcortical infarction. No sign of acute infarction, mass lesion, hemorrhage, hydrocephalus or extra-axial  collection. Vascular: Question hyperdense left MCA in the insular region. Skull: Normal Sinuses/Orbits: Clear/normal Other: None ASPECTS (Alberta Stroke Program Early CT Score) - Ganglionic level infarction (caudate, lentiform nuclei, internal capsule, insula, M1-M3 cortex): 7 - Supraganglionic infarction (M4-M6 cortex): 3 Total score (0-10 with 10 being normal): 10 IMPRESSION: 1. No acute  parenchymal finding by CT at this time. Old right frontal and left parietooccipital infarctions. However, it appears that there is a hyperdense left middle cerebral artery in the insular region. 2. ASPECTS is 10. 3. These results were called by telephone at the time of interpretation on 02/17/2018 at 7:15 pm to Dr. Phineas SemenGRAYDON GOODMAN , who verbally acknowledged these results. Electronically Signed   By: Paulina FusiMark  Shogry M.D.   On: 02/17/2018 19:17    EKG:   Orders placed or performed during the hospital encounter of 02/17/18  . EKG 12-Lead  . EKG 12-Lead  . ED EKG  . ED EKG  . EKG 12-Lead  . EKG 12-Lead  . EKG 12-Lead  . EKG 12-Lead  . EKG 12-Lead  . EKG 12-Lead  . EKG 12-Lead  . EKG 12-Lead    ASSESSMENT AND PLAN:   84y/o F with PMH significant for Afib s/p ablation, CHF, hypertension, sleep apnea presenting with TIA symptoms  1. Acute CVA- could be embolic, given persistence of symptoms -However symptoms are on the right side with sensory deficits. -MRI ordered. Prior back surgery but that was in 2010, patient had an MRI in 2012. -Appreciate neurology consult. On aspirin, allergy to Plavix -Will need TEE. Cardiology consulted -Continue statin -PT, OT consults  2. Right carotid stenosis-CT angiogram confirming severe stenosis -Appreciate vascular consult. -Likely stent placement as outpatient  3. Hypertension-on Imdur  4. COPD-stable, continue inhalers  5. DVT prophylaxis-on Lovenox     All the records are reviewed and case discussed with Care Management/Social Workerr. Management plans  discussed with the patient, family and they are in agreement.  CODE STATUS: Full Code  TOTAL TIME TAKING CARE OF THIS PATIENT: 38 minutes.   POSSIBLE D/C IN 1-2 DAYS, DEPENDING ON CLINICAL CONDITION.   Enid BaasKALISETTI,Maisen Schmit M.D on 02/19/2018 at 12:42 PM  Between 7am to 6pm - Pager - [phone]  After 6pm go to [email or web address] - password [email or web address]  [facility]  Office  [phone]  CC: Primary care physician; Pennie BanterPatel, Bhavesh, MD

## 2018-02-19 NOTE — Consult Note (Signed)
[facility]  CARDIOLOGY CONSULT NOTE  Patient ID: Rose ResMaggie G Langham MRN: [id number] DOB/AGE: 82/04/1930 82 y.o.  Admit date: 02/17/2018 Referring Physician Nemiah CommanderKalisetti Primary Physician [facility] Reason for Consultation CVA  HPI: 84 year old female referred for transesophageal echocardiogram.  The patient presents 02/17/2018 with slurred speech and right upper extremity numbness and weakness.  The patient symptoms have mostly resolved.  CT scan did not reveal acute findings.  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  CT angiography revealed 80% or greater stenosis right carotid bifurcation.  Patient was seen by neurology who felt that the symptoms were more consistent with embolic etiology and is requesting transesophageal echocardiogram to rule out intracardiac thrombus.  Review of systems complete and found to be negative unless listed above     Past Medical History:  Diagnosis Date  . Allergic rhinitis   . Anemia   . Arrhythmia    s/p ablation for A-fib  . Carpal tunnel syndrome   . CHF (congestive heart failure) (HCC)   . GERD (gastroesophageal reflux disease)   . Heart murmur   . Hyperlipidemia   . Hypertension   . Lumbar spine pain   . Nephrolithiasis   . Osteoarthritis   . Sleep apnea   . TIA (transient ischemic attack)     Past Surgical History:  Procedure Laterality Date  . ABDOMINAL HYSTERECTOMY    . ablation  January 2011   A-fib  . BACK SURGERY    . CARDIAC CATHETERIZATION    . ESOPHAGOGASTRODUODENOSCOPY N/A 05/22/2015   Procedure: ESOPHAGOGASTRODUODENOSCOPY (EGD);  Surgeon: Scot Junobert T Elliott, MD;  [facility];  Service: Endoscopy;  Laterality: N/A;  . RENAL ARTERY STENT    . REPLACEMENT TOTAL KNEE BILATERAL    . SAVORY DILATION N/A 05/22/2015   Procedure: SAVORY DILATION;  Surgeon: Scot Junobert T Elliott, MD;  [facility];  Service: Endoscopy;  Laterality: N/A;  . TEE WITHOUT CARDIOVERSION N/A  05/26/2017   Procedure: TRANSESOPHAGEAL ECHOCARDIOGRAM (TEE);  Surgeon: Alwyn Peaallwood, Dwayne D, MD;  Location: [facility] ORS;  Service: Cardiovascular;  Laterality: N/A;  . TONSILLECTOMY      Medications Prior to Admission  Medication Sig Dispense Refill Last Dose  . aspirin EC 81 MG tablet Take 81 mg by mouth 3 (three) times a week.    02/16/2018 at Unknown time  . atorvastatin (LIPITOR) 10 MG tablet Take 10 mg by mouth at bedtime.    02/17/2018 at Unknown time  . Calcium-Magnesium-Zinc 333-133-5 MG TABS Take 1 tablet by mouth once a week.   Past Week at Unknown time  . cholecalciferol (VITAMIN D) 1000 units tablet Take 1,000 Units by mouth daily.   02/17/2018 at Unknown time  . docusate sodium (COLACE) 100 MG capsule Take 100 mg by mouth 2 (two) times daily.   02/17/2018 at Unknown time  . furosemide (LASIX) 40 MG tablet Take 1 tablet (40 mg total) by mouth daily. 30 tablet 0 02/17/2018 at Unknown time  . hydrALAZINE (APRESOLINE) 10 MG tablet Take 1 tablet (10 mg total) by mouth 2 (two) times daily. 60 tablet 0 02/17/2018 at Unknown time  . isosorbide mononitrate (IMDUR) 30 MG 24 hr tablet Take 30 mg by mouth daily at 12 noon.    02/17/2018 at Unknown time  . losartan (COZAAR) 25 MG tablet Take 25 mg by mouth every other day.    02/16/2018 at Unknown time  . Melatonin 5 MG CAPS Take 10 mg by mouth at bedtime.  02/16/2018 at Unknown time  . metoprolol succinate (TOPROL-XL) 25 MG 24 hr tablet Take 25 mg by mouth at bedtime.    02/17/2018 at Unknown time  . Multiple Vitamin (MULTIVITAMIN) tablet Take 1 tablet by mouth daily.   02/17/2018 at Unknown time  . ranitidine (ZANTAC) 150 MG tablet TAKE ONE TABLET BY MOUTH TWICE DAILY HEARTBURN  5 02/17/2018 at Unknown time  . ranolazine (RANEXA) 500 MG 12 hr tablet Take 500 mg by mouth 2 (two) times daily.   02/17/2018 at Unknown time  . vitamin B-12 (CYANOCOBALAMIN) 1000 MCG tablet Take 1,000 mcg by mouth daily.   02/17/2018 at Unknown time  . vitamin k 100 MCG tablet Take 150 mcg by  mouth daily.   02/17/2018 at Unknown time  . acetaminophen (TYLENOL) 500 MG tablet Take 500-1,000 mg by mouth every 6 (six) hours as needed (pain).   PRN at PRN  . albuterol (PROVENTIL HFA;VENTOLIN HFA) 108 (90 BASE) MCG/ACT inhaler Inhale 2 puffs into the lungs every 6 (six) hours as needed for wheezing or shortness of breath.   PRN at PRN  . budesonide-formoterol (SYMBICORT) 80-4.5 MCG/ACT inhaler Inhale 2 puffs into the lungs 2 (two) times daily.   PRN at PRN   Social History   Socioeconomic History  . Marital status: Widowed    Spouse name: Not on file  . Number of children: Not on file  . Years of education: Not on file  . Highest education level: High school graduate  Occupational History  . Not on file  Social Needs  . Financial resource strain: Not very hard  . Food insecurity:    Worry: Sometimes true    Inability: Never true  . Transportation needs:    Medical: Yes    Non-medical: Yes  Tobacco Use  . Smoking status: Never Smoker  . Smokeless tobacco: Never Used  Substance and Sexual Activity  . Alcohol use: No  . Drug use: No  . Sexual activity: Not on file  Lifestyle  . Physical activity:    Days per week: 7 days    Minutes per session: 20 min  . Stress: Not at all  Relationships  . Social connections:    Talks on phone: More than three times a week    Gets together: Once a week    Attends religious service: More than 4 times per year    Active member of club or organization: Yes    Attends meetings of clubs or organizations: 1 to 4 times per year    Relationship status: Widowed  . Intimate partner violence:    Fear of current or ex partner: Patient refused    Emotionally abused: Patient refused    Physically abused: Patient refused    Forced sexual activity: Patient refused  Other Topics Concern  . Not on file  Social History Narrative  . Not on file    Family History  Problem Relation Age of Onset  . Diabetes Father   . Hypertension Mother        Review of systems complete and found to be negative unless listed above      PHYSICAL EXAM  General: Well developed, well nourished, in no acute distress HEENT:  Normocephalic and atramatic Neck:  No JVD.  Lungs: Clear bilaterally to auscultation and percussion. Heart: HRRR . Normal S1 and S2 without gallops or murmurs.  Abdomen: Bowel sounds are positive, abdomen soft and non-tender  Msk:  Back normal, normal gait. Normal strength and tone  for age. Extremities: No clubbing, cyanosis or edema.   Neuro: Alert and oriented X 3. Psych:  Good affect, responds appropriately  Labs:   Lab Results  Component Value Date   WBC 6.6 02/19/2018   HGB 10.5 (L) 02/19/2018   HCT 32.6 (L) 02/19/2018   MCV 87.3 02/19/2018   PLT 211 02/19/2018    Recent Labs  Lab 02/19/18 0405  NA 139  K 4.1  CL 106  CO2 27  BUN 25*  CREATININE 1.35*  CALCIUM 8.6*  GLUCOSE 106*   Lab Results  Component Value Date   CKTOTAL 115 05/19/2013   CKMB 1.5 05/19/2013   TROPONINI 0.03 (HH) 02/18/2018    Lab Results  Component Value Date   CHOL 169 02/17/2018   Lab Results  Component Value Date   HDL 55 02/17/2018   Lab Results  Component Value Date   LDLCALC 83 02/17/2018   Lab Results  Component Value Date   TRIG 155 (H) 02/17/2018   Lab Results  Component Value Date   CHOLHDL 3.1 02/17/2018   No results found for: LDLDIRECT    Radiology: Ct Angio Head W Or Wo Contrast  Result Date: 02/17/2018 CLINICAL DATA:  Code stroke. Aphasia. Abnormal head CT with tiny hyperdensity in the left MCA branch region. EXAM: CT ANGIOGRAPHY HEAD TECHNIQUE: Multidetector CT imaging of the head was performed using the standard protocol during bolus administration of intravenous contrast. Multiplanar CT image reconstructions and MIPs were obtained to evaluate the vascular anatomy. CONTRAST:  60mL OMNIPAQUE IOHEXOL 350 MG/ML SOLN COMPARISON:  Earlier today.  MR angiography 07/09/2011. FINDINGS: CTA HEAD  Anterior circulation: The carotid arteries show extensive peripheral calcification throughout the skull base and siphon region but flow does persist. Stenosis is difficult to measure accurately in this situation, but is probably approximately 50% on both sides. Supraclinoid internal carotid arteries are widely patent. The anterior and middle cerebral vessels are patent without proximal stenosis, aneurysm or vascular malformation. The M2 and M3 branch vessels opacify. The tiny calcification seen at noncontrast head CT is associated with a vessel, but is not occlusive. Posterior circulation: Chronic left vertebral artery occlusion. The right vertebral artery shows atherosclerotic change at the foramen magnum with stenosis of 50%. Distal vertebral artery is widely patent to the basilar. There is retrograde flow to left PICA. The basilar artery shows atherosclerotic irregularity but no critical stenosis. Superior cerebellar and posterior cerebral arteries show flow. Venous sinuses: Patent and normal. Anatomic variants: None significant Delayed phase: No abnormal enhancement. IMPRESSION: No large or medium vessel occlusion. The tiny calcification seen at previous CT is associated with the left M3 branch but is not occlusive. Extensive chronic atherosclerotic calcification throughout both carotid siphon regions with stenosis estimated at 50%. Chronic left vertebral occlusion. 50% stenosis of the right V4 segment. These results were called by telephone at the time of interpretation on 02/17/2018 at 8:18 pm to Dr. Phineas Semen , who verbally acknowledged these results. Electronically Signed   By: Paulina Fusi M.D.   On: 02/17/2018 20:19   Dg Chest 1 View  Result Date: 02/18/2018 CLINICAL DATA:  84 year old with a likely TIA, RIGHT UPPER extremity sensory deficit and weakness along with slurred speech which occurred yesterday, nearly completely resolved. Patient also complained of chest pain and shortness of breath.  Current history of hypertension and CHF. EXAM: Portable CHEST 1 VIEW COMPARISON:  03/11/2017, 06/20/2016 and earlier. FINDINGS: Cardiac silhouette moderately enlarged even allowing for AP portable technique, unchanged. Thoracic aorta mildly  tortuous and atherosclerotic. Enlarged central pulmonary arteries, RIGHT greater than LEFT, unchanged. Mild diffuse interstitial pulmonary edema, new since the examination 1 year ago. No confluent airspace consolidation. IMPRESSION: Mild CHF, with stable cardiomegaly and mild diffuse interstitial pulmonary edema. Electronically Signed   By: Hulan Saas M.D.   On: 02/18/2018 10:21   Ct Angio Neck W Or Wo Contrast  Result Date: 02/18/2018 CLINICAL DATA:  Follow-up carotid stenosis. EXAM: CT ANGIOGRAPHY NECK TECHNIQUE: Multidetector CT imaging of the neck was performed using the standard protocol during bolus administration of intravenous contrast. Multiplanar CT image reconstructions and MIPs were obtained to evaluate the vascular anatomy. Carotid stenosis measurements (when applicable) are obtained utilizing NASCET criteria, using the distal internal carotid diameter as the denominator. CONTRAST:  75mL OMNIPAQUE IOHEXOL 350 MG/ML SOLN COMPARISON:  Ultrasound earlier today. FINDINGS: Aortic arch: Aortic atherosclerosis. No aneurysm or dissection. Branching pattern of the brachiocephalic vessels from the arch is normal. 40-50% stenosis of the left subclavian artery origin. Right carotid system: Common carotid artery widely patent to the bifurcation region. Dense calcified plaque at the proximal ICA and through the ICA bulb. Luminal diameter 1 mm or less. Compared to an expected distal ICA diameter of 5 mm, this indicates a stenosis of 80% or possibly greater. In this case, the cervical ICA is small because of reduced inflow. Note that the vessels are tortuous in the carotid bifurcations are somewhat medial to the normal position, near the posterior hypopharynx. Left carotid  system: Common carotid artery shows some plaque but is widely patent to the bifurcation. Calcified plaque at the carotid bifurcation and proximal ICA. Minimal diameter in the ICA bulb is approximately 3 mm. Compared to a more distal cervical ICA diameter of 5 mm, this indicates a 40% stenosis. Pronounced vessel tortuosity on this side as well. External carotid artery origin is severely stenotic. Vertebral arteries: Right vertebral artery is dominant. There is atherosclerotic plaque at the right vertebral artery origin with stenosis estimated at 70%. The vessel is widely patent beyond that through the cervical region. The small left vertebral artery shows wide patency at its origin and through the cervical region. Skeleton: Advanced cervical spondylosis. Other neck: No soft tissue mass or lymphadenopathy. Upper chest: Interstitial and alveolar pulmonary edema. Bilateral pleural effusions layering dependently. IMPRESSION: Advanced/extensive calcified plaque at the right carotid bifurcation and ICA bulb region. Patent luminal diameter 1 mm or less, consistent with 80% or greater stenosis. Distal ICA shows a small caliber secondary to reduced inflow. Atherosclerotic calcification at the left carotid bifurcation and ICA bulb. Maximal stenosis at the distal bulb level measures 40%. Severe stenosis of the proximal external carotid artery. Both carotid systems are markedly tortuous, the bifurcations being more medially situated than normal, close to the posterior aspect of the hypopharynx. Interstitial and alveolar pulmonary edema with layering effusions. Electronically Signed   By: Paulina Fusi M.D.   On: 02/18/2018 21:15   US Carotid Bilateral (at [facility] And [facility] Only)  Result Date: 02/18/2018 CLINICAL DATA:  84 year old female with right-sided sensory deficit EXAM: BILATERAL CAROTID DUPLEX ULTRASOUND TECHNIQUE: Wallace Cullens scale imaging, color Doppler and duplex ultrasound were performed of bilateral carotid and vertebral  arteries in the neck. COMPARISON:  Prior duplex carotid ultrasound 01/08/2010 FINDINGS: Criteria: Quantification of carotid stenosis is based on velocity parameters that correlate the residual internal carotid diameter with NASCET-based stenosis levels, using the diameter of the distal internal carotid lumen as the denominator for stenosis measurement. The following velocity measurements were obtained: RIGHT ICA:  160/43 cm/sec CCA:  70/8 cm/sec SYSTOLIC ICA/CCA RATIO:  2.3 DIASTOLIC ICA/CCA RATIO:  5.1 ECA:  97 cm/sec LEFT ICA:  86/32 cm/sec CCA:  58/19 cm/sec SYSTOLIC ICA/CCA RATIO:  1.5 DIASTOLIC ICA/CCA RATIO:  1.6 ECA:  139 cm/sec RIGHT CAROTID ARTERY: Bulky, heterogeneous and partially calcified atherosclerotic plaque in the proximal internal carotid artery. By peak diastolic velocity criteria, the estimated stenosis is greater than 70%. There is nearly a sonographic string sign consistent with a critical stenosis. RIGHT VERTEBRAL ARTERY:  Patent with antegrade flow. LEFT CAROTID ARTERY: Heterogeneous, and irregular atherosclerotic plaque in the common carotid artery extending into the proximal internal carotid artery. By peak systolic velocity criteria, the estimated stenosis remains less than 50%. LEFT VERTEBRAL ARTERY:  Patent with normal antegrade flow. IMPRESSION: 1. Critical right internal carotid artery stenosis with end-diastolic velocity greater than 40 centimeters/second and a sonographic string sign. 2. Mild (1-49%) stenosis proximal left internal carotid artery secondary to heterogenous atherosclerotic plaque. 3. Vertebral arteries are patent with antegrade flow. These results will be called to the ordering clinician or representative by the Radiologist Assistant, and communication documented in the PACS or zVision Dashboard. Signed, Sterling Big, MD [facility] Electronically Signed   By: Malachy Moan M.D.   On: 02/18/2018  08:24   Ct Head Code Stroke Wo Contrast  Result Date: 02/17/2018 CLINICAL DATA:  Code stroke.  Aphasia beginning 4 hours ago. EXAM: CT HEAD WITHOUT CONTRAST TECHNIQUE: Contiguous axial images were obtained from the base of the skull through the vertex without intravenous contrast. COMPARISON:  10/07/2013 CT.  07/09/2011 MRI. FINDINGS: Brain: No abnormality seen affecting the brainstem. Probably old small vessel infarction in the left cerebellum. Cerebral hemispheres show atrophy with chronic small-vessel changes of the white matter. Old right frontal cortical and subcortical infarction. Old left parieto-occipital cortical and subcortical infarction. No sign of acute infarction, mass lesion, hemorrhage, hydrocephalus or extra-axial collection. Vascular: Question hyperdense left MCA in the insular region. Skull: Normal Sinuses/Orbits: Clear/normal Other: None ASPECTS (Alberta Stroke Program Early CT Score) - Ganglionic level infarction (caudate, lentiform nuclei, internal capsule, insula, M1-M3 cortex): 7 - Supraganglionic infarction (M4-M6 cortex): 3 Total score (0-10 with 10 being normal): 10 IMPRESSION: 1. No acute parenchymal finding by CT at this time. Old right frontal and left parietooccipital infarctions. However, it appears that there is a hyperdense left middle cerebral artery in the insular region. 2. ASPECTS is 10. 3. These results were called by telephone at the time of interpretation on 02/17/2018 at 7:15 pm to Dr. Phineas Semen , who verbally acknowledged these results. Electronically Signed   By: Paulina Fusi M.D.   On: 02/17/2018 19:17    EKG: Normal sinus rhythm  ASSESSMENT AND PLAN:   1.  TIA/CVA, possible embolic stroke 2.  80% stenosis right carotid bifurcation, evaluated by vascular surgery  Recommendations  1.  Agree with overall current therapy 2.  Transesophageal echocardiogram to rule out intracardiac thrombus.  The risks, benefits and alternatives were explained to the  patient and informed written consent was obtained.  Signed: Marcina Millard MD,PhD, [facility] 02/19/2018, 1:06 PM

## 2018-02-19 NOTE — Progress Notes (Signed)
Subjective: Patient reports no further speech deficits or right sided sensory deficits.  .    Objective: Current vital signs: BP 127/76 (BP Location: Right Arm)   Pulse 80   Temp 98.6 F (37 C) (Oral)   Resp 16   Ht 5\' 5"  (1.651 m)   Wt 85.2 kg (187 lb 14.4 oz)   SpO2 97%   BMI 31.27 kg/m  Vital signs in last 24 hours: Temp:  [97.4 F (36.3 C)-98.6 F (37 C)] 98.6 F (37 C) (04/08 0513) Pulse Rate:  [80-91] 80 (04/08 0751) Resp:  [16-28] 16 (04/08 0751) BP: (127-154)/(74-82) 127/76 (04/08 0751) SpO2:  [95 %-98 %] 97 % (04/08 0751)  Intake/Output from previous day: No intake/output data recorded. Intake/Output this shift: Total I/O In: 360 [P.O.:360] Out: -  Nutritional status: Fall precautions Diet Heart Room service appropriate? Yes; Fluid consistency: Thin  Neurologic Exam: Mental Status: Alert and awake.  Memory poor.  Speech fluent without evidence of aphasia.  Able to follow 3 step commands without difficulty. Cranial Nerves: II: Discs flat bilaterally; Visual fields grossly normal, pupils equal, round, reactive to light and accommodation III,IV, VI: ptosis not present, extra-ocular motions intact bilaterally V,VII: smile symmetric, facial light touch sensation normal bilaterally VIII: hearing normal bilaterally IX,X: gag reflex present XI: bilateral shoulder shrug XII: midline tongue extension Motor: Able to lift all extremities against gravity.   Sensory: No sensory complaints.     Lab Results: Basic Metabolic Panel: Recent Labs  Lab 02/17/18 1856 02/18/18 1521 02/19/18 0405  NA 142 141 139  K 3.9 4.2 4.1  CL 110 107 106  CO2 26 28 27   GLUCOSE 114* 124* 106*  BUN 28* 21* 25*  CREATININE 1.22* 1.20* 1.35*  CALCIUM 8.7* 8.6* 8.6*    Liver Function Tests: No results for input(s): AST, ALT, ALKPHOS, BILITOT, PROT, ALBUMIN in the last 168 hours. No results for input(s): LIPASE, AMYLASE in the last 168 hours. No results for input(s): AMMONIA in  the last 168 hours.  CBC: Recent Labs  Lab 02/17/18 1856 02/19/18 0405  WBC 5.7 6.6  HGB 10.7* 10.5*  HCT 32.9* 32.6*  MCV 87.4 87.3  PLT 201 211    Cardiac Enzymes: Recent Labs  Lab 02/17/18 1856 02/18/18 0937 02/18/18 1521 02/18/18 2116  TROPONINI 0.04* 0.03* 0.03* 0.03*    Lipid Panel: Recent Labs  Lab 02/17/18 1856  CHOL 169  TRIG 155*  HDL 55  CHOLHDL 3.1  VLDL 31  LDLCALC 83    CBG: Recent Labs  Lab 02/17/18 1859  GLUCAP 106*    Microbiology: Results for orders placed or performed during the hospital encounter of 01/05/10  Surgical pcr screen     Status: None   Collection Time: 01/05/10  7:01 AM  Result Value Ref Range Status   MRSA, PCR NEGATIVE NEGATIVE Final   Staphylococcus aureus  NEGATIVE Final    NEGATIVE        The Xpert SA Assay (FDA approved for NASAL specimens only), is one component of a comprehensive surveillance program.  It is not intended to diagnose infection nor to guide or monitor treatment.    Coagulation Studies: Recent Labs    02/17/18 1856  LABPROT 13.5  INR 1.04    Imaging: Ct Angio Head W Or Wo Contrast  Result Date: 02/17/2018 CLINICAL DATA:  Code stroke. Aphasia. Abnormal head CT with tiny hyperdensity in the left MCA branch region. EXAM: CT ANGIOGRAPHY HEAD TECHNIQUE: Multidetector CT imaging of the head was  performed using the standard protocol during bolus administration of intravenous contrast. Multiplanar CT image reconstructions and MIPs were obtained to evaluate the vascular anatomy. CONTRAST:  60mL OMNIPAQUE IOHEXOL 350 MG/ML SOLN COMPARISON:  Earlier today.  MR angiography 07/09/2011. FINDINGS: CTA HEAD Anterior circulation: The carotid arteries show extensive peripheral calcification throughout the skull base and siphon region but flow does persist. Stenosis is difficult to measure accurately in this situation, but is probably approximately 50% on both sides. Supraclinoid internal carotid arteries are  widely patent. The anterior and middle cerebral vessels are patent without proximal stenosis, aneurysm or vascular malformation. The M2 and M3 branch vessels opacify. The tiny calcification seen at noncontrast head CT is associated with a vessel, but is not occlusive. Posterior circulation: Chronic left vertebral artery occlusion. The right vertebral artery shows atherosclerotic change at the foramen magnum with stenosis of 50%. Distal vertebral artery is widely patent to the basilar. There is retrograde flow to left PICA. The basilar artery shows atherosclerotic irregularity but no critical stenosis. Superior cerebellar and posterior cerebral arteries show flow. Venous sinuses: Patent and normal. Anatomic variants: None significant Delayed phase: No abnormal enhancement. IMPRESSION: No large or medium vessel occlusion. The tiny calcification seen at previous CT is associated with the left M3 branch but is not occlusive. Extensive chronic atherosclerotic calcification throughout both carotid siphon regions with stenosis estimated at 50%. Chronic left vertebral occlusion. 50% stenosis of the right V4 segment. These results were called by telephone at the time of interpretation on 02/17/2018 at 8:18 pm to Dr. Phineas Semen , who verbally acknowledged these results. Electronically Signed   By: Paulina Fusi M.D.   On: 02/17/2018 20:19   Dg Chest 1 View  Result Date: 02/18/2018 CLINICAL DATA:  84 year old with a likely TIA, RIGHT UPPER extremity sensory deficit and weakness along with slurred speech which occurred yesterday, nearly completely resolved. Patient also complained of chest pain and shortness of breath. Current history of hypertension and CHF. EXAM: Portable CHEST 1 VIEW COMPARISON:  03/11/2017, 06/20/2016 and earlier. FINDINGS: Cardiac silhouette moderately enlarged even allowing for AP portable technique, unchanged. Thoracic aorta mildly tortuous and atherosclerotic. Enlarged central pulmonary arteries,  RIGHT greater than LEFT, unchanged. Mild diffuse interstitial pulmonary edema, new since the examination 1 year ago. No confluent airspace consolidation. IMPRESSION: Mild CHF, with stable cardiomegaly and mild diffuse interstitial pulmonary edema. Electronically Signed   By: Hulan Saas M.D.   On: 02/18/2018 10:21   Ct Angio Neck W Or Wo Contrast  Result Date: 02/18/2018 CLINICAL DATA:  Follow-up carotid stenosis. EXAM: CT ANGIOGRAPHY NECK TECHNIQUE: Multidetector CT imaging of the neck was performed using the standard protocol during bolus administration of intravenous contrast. Multiplanar CT image reconstructions and MIPs were obtained to evaluate the vascular anatomy. Carotid stenosis measurements (when applicable) are obtained utilizing NASCET criteria, using the distal internal carotid diameter as the denominator. CONTRAST:  75mL OMNIPAQUE IOHEXOL 350 MG/ML SOLN COMPARISON:  Ultrasound earlier today. FINDINGS: Aortic arch: Aortic atherosclerosis. No aneurysm or dissection. Branching pattern of the brachiocephalic vessels from the arch is normal. 40-50% stenosis of the left subclavian artery origin. Right carotid system: Common carotid artery widely patent to the bifurcation region. Dense calcified plaque at the proximal ICA and through the ICA bulb. Luminal diameter 1 mm or less. Compared to an expected distal ICA diameter of 5 mm, this indicates a stenosis of 80% or possibly greater. In this case, the cervical ICA is small because of reduced inflow. Note that the vessels are  tortuous in the carotid bifurcations are somewhat medial to the normal position, near the posterior hypopharynx. Left carotid system: Common carotid artery shows some plaque but is widely patent to the bifurcation. Calcified plaque at the carotid bifurcation and proximal ICA. Minimal diameter in the ICA bulb is approximately 3 mm. Compared to a more distal cervical ICA diameter of 5 mm, this indicates a 40% stenosis. Pronounced  vessel tortuosity on this side as well. External carotid artery origin is severely stenotic. Vertebral arteries: Right vertebral artery is dominant. There is atherosclerotic plaque at the right vertebral artery origin with stenosis estimated at 70%. The vessel is widely patent beyond that through the cervical region. The small left vertebral artery shows wide patency at its origin and through the cervical region. Skeleton: Advanced cervical spondylosis. Other neck: No soft tissue mass or lymphadenopathy. Upper chest: Interstitial and alveolar pulmonary edema. Bilateral pleural effusions layering dependently. IMPRESSION: Advanced/extensive calcified plaque at the right carotid bifurcation and ICA bulb region. Patent luminal diameter 1 mm or less, consistent with 80% or greater stenosis. Distal ICA shows a small caliber secondary to reduced inflow. Atherosclerotic calcification at the left carotid bifurcation and ICA bulb. Maximal stenosis at the distal bulb level measures 40%. Severe stenosis of the proximal external carotid artery. Both carotid systems are markedly tortuous, the bifurcations being more medially situated than normal, close to the posterior aspect of the hypopharynx. Interstitial and alveolar pulmonary edema with layering effusions. Electronically Signed   By: Paulina Fusi M.D.   On: 02/18/2018 21:15   US Carotid Bilateral (at [facility] Only)  Result Date: 02/18/2018 CLINICAL DATA:  84 year old female with right-sided sensory deficit EXAM: BILATERAL CAROTID DUPLEX ULTRASOUND TECHNIQUE: Wallace Cullens scale imaging, color Doppler and duplex ultrasound were performed of bilateral carotid and vertebral arteries in the neck. COMPARISON:  Prior duplex carotid ultrasound 01/08/2010 FINDINGS: Criteria: Quantification of carotid stenosis is based on velocity parameters that correlate the residual internal carotid diameter with NASCET-based stenosis levels, using the diameter of the distal internal carotid lumen as  the denominator for stenosis measurement. The following velocity measurements were obtained: RIGHT ICA:  160/43 cm/sec CCA:  70/8 cm/sec SYSTOLIC ICA/CCA RATIO:  2.3 DIASTOLIC ICA/CCA RATIO:  5.1 ECA:  97 cm/sec LEFT ICA:  86/32 cm/sec CCA:  58/19 cm/sec SYSTOLIC ICA/CCA RATIO:  1.5 DIASTOLIC ICA/CCA RATIO:  1.6 ECA:  139 cm/sec RIGHT CAROTID ARTERY: Bulky, heterogeneous and partially calcified atherosclerotic plaque in the proximal internal carotid artery. By peak diastolic velocity criteria, the estimated stenosis is greater than 70%. There is nearly a sonographic string sign consistent with a critical stenosis. RIGHT VERTEBRAL ARTERY:  Patent with antegrade flow. LEFT CAROTID ARTERY: Heterogeneous, and irregular atherosclerotic plaque in the common carotid artery extending into the proximal internal carotid artery. By peak systolic velocity criteria, the estimated stenosis remains less than 50%. LEFT VERTEBRAL ARTERY:  Patent with normal antegrade flow. IMPRESSION: 1. Critical right internal carotid artery stenosis with end-diastolic velocity greater than 40 centimeters/second and a sonographic string sign. 2. Mild (1-49%) stenosis proximal left internal carotid artery secondary to heterogenous atherosclerotic plaque. 3. Vertebral arteries are patent with antegrade flow. These results will be called to the ordering clinician or representative by the Radiologist Assistant, and communication documented in the PACS or zVision Dashboard. Signed, Sterling Big, MD [facility] Electronically Signed   By: Malachy Moan M.D.   On: 02/18/2018 08:24   Ct Head Code Stroke Wo Contrast  Result Date:  02/17/2018 CLINICAL DATA:  Code stroke.  Aphasia beginning 4 hours ago. EXAM: CT HEAD WITHOUT CONTRAST TECHNIQUE: Contiguous axial images were obtained from the base of the skull through the vertex without intravenous contrast. COMPARISON:  10/07/2013 CT.   07/09/2011 MRI. FINDINGS: Brain: No abnormality seen affecting the brainstem. Probably old small vessel infarction in the left cerebellum. Cerebral hemispheres show atrophy with chronic small-vessel changes of the white matter. Old right frontal cortical and subcortical infarction. Old left parieto-occipital cortical and subcortical infarction. No sign of acute infarction, mass lesion, hemorrhage, hydrocephalus or extra-axial collection. Vascular: Question hyperdense left MCA in the insular region. Skull: Normal Sinuses/Orbits: Clear/normal Other: None ASPECTS (Alberta Stroke Program Early CT Score) - Ganglionic level infarction (caudate, lentiform nuclei, internal capsule, insula, M1-M3 cortex): 7 - Supraganglionic infarction (M4-M6 cortex): 3 Total score (0-10 with 10 being normal): 10 IMPRESSION: 1. No acute parenchymal finding by CT at this time. Old right frontal and left parietooccipital infarctions. However, it appears that there is a hyperdense left middle cerebral artery in the insular region. 2. ASPECTS is 10. 3. These results were called by telephone at the time of interpretation on 02/17/2018 at 7:15 pm to Dr. Phineas Semen , who verbally acknowledged these results. Electronically Signed   By: Paulina Fusi M.D.   On: 02/17/2018 19:17    Medications:  I have reviewed the patient's current medications. Scheduled: . aspirin EC  325 mg Oral Daily  . atorvastatin  10 mg Oral QHS  . enoxaparin (LOVENOX) injection  40 mg Subcutaneous Q24H  . famotidine  20 mg Oral BID  . furosemide  40 mg Oral Daily  . isosorbide mononitrate  30 mg Oral Q1200  . mometasone-formoterol  2 puff Inhalation BID  . ranolazine  500 mg Oral BID    Assessment/Plan: Patient without new neurological complaints.  CTA of the neck performed and reveals >80% RICA stenosis and approximately 40% LICA stenosis.  Family has spoken with office that did the patient's back surgery and she is able to have a MRI of the brain.  This  should be correct since patient with MRI of the brain in 2012 per records (surgery done 2010 per family).  Presenting symptoms not consistent with RICA stenosis.  Concern is for embolic etiology.  Further work up as recommended previously.  Echocardiogram with EF of 60-65% and severe stenosis of mitral valve.  Patient on ASA.  Recommendations: 1.  TEE with loop to be placed if study unremarkable.   2.  MRI of the brain without contrast  Case discussed with Dr. Nemiah Commander    LOS: 1 day   Thana Farr, MD Neurology [phone] 02/19/2018  12:37 PM

## 2018-02-20 ENCOUNTER — Encounter: Payer: Self-pay | Admitting: *Deleted

## 2018-02-20 ENCOUNTER — Inpatient Hospital Stay
Admit: 2018-02-20 | Discharge: 2018-02-20 | Disposition: A | Discharge status: Home / Self Care | Payer: Medicare Other | Attending: Cardiology | Admitting: Cardiology

## 2018-02-20 ENCOUNTER — Encounter
Admission: EM | Disposition: A | Discharge status: Home / Self Care | Payer: Self-pay | Source: Home / Self Care | Attending: Internal Medicine

## 2018-02-20 ENCOUNTER — Inpatient Hospital Stay: Payer: Medicare Other

## 2018-02-20 DIAGNOSIS — I639 Cerebral infarction, unspecified: Secondary | ICD-10-CM

## 2018-02-20 HISTORY — PX: TEE WITHOUT CARDIOVERSION: SHX5443

## 2018-02-20 SURGERY — ECHOCARDIOGRAM, TRANSESOPHAGEAL
Anesthesia: Moderate Sedation

## 2018-02-20 MED ORDER — ATORVASTATIN CALCIUM 20 MG PO TABS: 20.0000 mg | ORAL_TABLET | Freq: Every day | ORAL | 2 refills | Status: AC

## 2018-02-20 MED ORDER — LIDOCAINE VISCOUS 2 % MT SOLN
OROMUCOSAL | Status: AC
  Filled 2018-02-20: qty 15

## 2018-02-20 MED ORDER — MIDAZOLAM HCL 5 MG/5ML IJ SOLN
INTRAMUSCULAR | Status: AC
  Filled 2018-02-20: qty 5

## 2018-02-20 MED ORDER — TICAGRELOR 90 MG PO TABS: 90.0000 mg | ORAL_TABLET | Freq: Two times a day (BID) | ORAL | 2 refills | Status: AC

## 2018-02-20 MED ORDER — FENTANYL CITRATE (PF) 100 MCG/2ML IJ SOLN
INTRAMUSCULAR | Status: AC
  Filled 2018-02-20: qty 2

## 2018-02-20 MED ORDER — FENTANYL CITRATE (PF) 100 MCG/2ML IJ SOLN
INTRAMUSCULAR | Status: AC | PRN
  Administered 2018-02-20: 25 ug via INTRAVENOUS
  Administered 2018-02-20: 50 ug via INTRAVENOUS

## 2018-02-20 MED ORDER — MIDAZOLAM HCL 2 MG/2ML IJ SOLN
INTRAMUSCULAR | Status: AC | PRN
  Administered 2018-02-20: 2 mg via INTRAVENOUS
  Administered 2018-02-20: 1 mg via INTRAVENOUS

## 2018-02-20 MED ORDER — SODIUM CHLORIDE FLUSH 0.9 % IV SOLN
INTRAVENOUS | Status: AC
  Filled 2018-02-20: qty 10

## 2018-02-20 MED ORDER — BUTAMBEN-TETRACAINE-BENZOCAINE 2-2-14 % EX AERO
INHALATION_SPRAY | CUTANEOUS | Status: AC
  Filled 2018-02-20: qty 5

## 2018-02-20 MED ORDER — SODIUM CHLORIDE 0.9 % IV SOLN
INTRAVENOUS | Status: DC
  Administered 2018-02-20: 08:00:00 via INTRAVENOUS

## 2018-02-20 NOTE — CV Procedure (Signed)
   TRANSESOPHAGEAL ECHOCARDIOGRAM   NAME:  Rose [name]   MRN: [id number] DOB:  07/28/1930   ADMIT DATE: 02/17/2018  INDICATIONS:   PROCEDURE:   Informed consent was obtained prior to the procedure. The risks, benefits and alternatives for the procedure were discussed and the patient comprehended these risks.  Risks include, but are not limited to, cough, sore throat, vomiting, nausea, somnolence, esophageal and stomach trauma or perforation, bleeding, low blood pressure, aspiration, pneumonia, infection, trauma to the teeth and death.    After a procedural time-out, the patient was given 3 mg versed and 75 mcg fentanyl for moderate sedation.  The oropharynx was anesthetized 2 cc of topical 1% viscous lidocaine.  The transesophageal probe was inserted in the esophagus and stomach without difficulty and multiple views were obtained.    COMPLICATIONS:    There were no immediate complications.  FINDINGS:  LEFT VENTRICLE: EF = 55%. No regional wall motion abnormalities.  RIGHT VENTRICLE: Normal size and function.   LEFT ATRIUM:  No thrombus, spontaneous contrast noted. Mildly enlarged.   LEFT ATRIAL APPENDAGE: No thrombus.   RIGHT ATRIUM: upper normal. No thrombus.   AORTIC VALVE:  Trileaflet. Trivial ai. No obvious thrombus  MITRAL VALVE:    Normal. Mild mr. No thrombus  TRICUSPID VALVE: Normal. No thrombus   PULMONIC VALVE: Grossly normal.  INTERATRIAL SEPTUM: No PFO or ASD. Agitated contrast injected. No pfo or asd.   PERICARDIUM: No effusion  DESCENDING AORTA: Mild plaque   CONCLUSION: No obvious cardiac source of emboli. Spontaneous contrast in left atrium.

## 2018-02-20 NOTE — Progress Notes (Signed)
Text paged MD to [facility] and [facility] results in are in system. MD called to confirm receipt of text.

## 2018-02-20 NOTE — Progress Notes (Signed)
 Vein & Vascular Surgery  Daily Progress Note   The patient has been placed on Dr. Driscilla Grammesew's schedule for a right carotid endarterectomy on March 07, 2018.  The patient will need to be cardiac cleared before surgery.  Recommend discharge on Aspirin and Brilinta as the patient is allergic to Plavix.  Vascular surgery will sign off at this time.  Cleda DaubKimberly Vishruth Seoane PA-C 02/20/2018 10:37 AM

## 2018-02-20 NOTE — Progress Notes (Signed)
Subjective: Patient s/p TEE. No new neurological complaints.    Objective: Current vital signs: BP 121/71 (BP Location: Left Arm)   Pulse 87   Temp 97.8 F (36.6 C) (Oral)   Resp 17   Ht 5\' 5"  (1.651 m)   Wt 85.2 kg (187 lb 14.4 oz)   SpO2 98%   BMI 31.27 kg/m  Vital signs in last 24 hours: Temp:  [97.8 F (36.6 C)-98.6 F (37 C)] 97.8 F (36.6 C) (04/09 1040) Pulse Rate:  [81-95] 87 (04/09 1040) Resp:  [12-27] 17 (04/09 1040) BP: (121-151)/(68-92) 121/71 (04/09 1040) SpO2:  [88 %-100 %] 98 % (04/09 1040)  Intake/Output from previous day: 04/08 0701 - 04/09 0700 In: 960 [P.O.:960] Out: -  Intake/Output this shift: No intake/output data recorded. Nutritional status: Fall precautions Diet Heart Room service appropriate? Yes; Fluid consistency: Thin  Neurologic Exam: Mental Status: Alert and awake.  Speech fluent without evidence of aphasia.  Able to follow 3 step commands without difficulty. Cranial Nerves: II: Discs flat bilaterally; Visual fields grossly normal, pupils equal, round, reactive to light and accommodation III,IV, VI: ptosis not present, extra-ocular motions intact bilaterally V,VII: smile symmetric, facial light touch sensation normal bilaterally VIII: hearing normal bilaterally IX,X: gag reflex present XI: bilateral shoulder shrug XII: midline tongue extension Motor: Able to lift all extremities against gravity.   Sensory: No sensory complaints.     Lab Results: Basic Metabolic Panel: Recent Labs  Lab 02/17/18 1856 02/18/18 1521 02/19/18 0405  NA 142 141 139  K 3.9 4.2 4.1  CL 110 107 106  CO2 26 28 27   GLUCOSE 114* 124* 106*  BUN 28* 21* 25*  CREATININE 1.22* 1.20* 1.35*  CALCIUM 8.7* 8.6* 8.6*    Liver Function Tests: No results for input(s): AST, ALT, ALKPHOS, BILITOT, PROT, ALBUMIN in the last 168 hours. No results for input(s): LIPASE, AMYLASE in the last 168 hours. No results for input(s): AMMONIA in the last 168  hours.  CBC: Recent Labs  Lab 02/17/18 1856 02/19/18 0405  WBC 5.7 6.6  HGB 10.7* 10.5*  HCT 32.9* 32.6*  MCV 87.4 87.3  PLT 201 211    Cardiac Enzymes: Recent Labs  Lab 02/17/18 1856 02/18/18 0937 02/18/18 1521 02/18/18 2116  TROPONINI 0.04* 0.03* 0.03* 0.03*    Lipid Panel: Recent Labs  Lab 02/17/18 1856  CHOL 169  TRIG 155*  HDL 55  CHOLHDL 3.1  VLDL 31  LDLCALC 83    CBG: Recent Labs  Lab 02/17/18 1859  GLUCAP 106*    Microbiology: Results for orders placed or performed during the hospital encounter of 01/05/10  Surgical pcr screen     Status: None   Collection Time: 01/05/10  7:01 AM  Result Value Ref Range Status   MRSA, PCR NEGATIVE NEGATIVE Final   Staphylococcus aureus  NEGATIVE Final    NEGATIVE        The Xpert SA Assay (FDA approved for NASAL specimens only), is one component of a comprehensive surveillance program.  It is not intended to diagnose infection nor to guide or monitor treatment.    Coagulation Studies: Recent Labs    02/17/18 1856  LABPROT 13.5  INR 1.04    Imaging: Ct Angio Neck W Or Wo Contrast  Result Date: 02/18/2018 CLINICAL DATA:  Follow-up carotid stenosis. EXAM: CT ANGIOGRAPHY NECK TECHNIQUE: Multidetector CT imaging of the neck was performed using the standard protocol during bolus administration of intravenous contrast. Multiplanar CT image reconstructions and MIPs were  obtained to evaluate the vascular anatomy. Carotid stenosis measurements (when applicable) are obtained utilizing NASCET criteria, using the distal internal carotid diameter as the denominator. CONTRAST:  75mL OMNIPAQUE IOHEXOL 350 MG/ML SOLN COMPARISON:  Ultrasound earlier today. FINDINGS: Aortic arch: Aortic atherosclerosis. No aneurysm or dissection. Branching pattern of the brachiocephalic vessels from the arch is normal. 40-50% stenosis of the left subclavian artery origin. Right carotid system: Common carotid artery widely patent to the  bifurcation region. Dense calcified plaque at the proximal ICA and through the ICA bulb. Luminal diameter 1 mm or less. Compared to an expected distal ICA diameter of 5 mm, this indicates a stenosis of 80% or possibly greater. In this case, the cervical ICA is small because of reduced inflow. Note that the vessels are tortuous in the carotid bifurcations are somewhat medial to the normal position, near the posterior hypopharynx. Left carotid system: Common carotid artery shows some plaque but is widely patent to the bifurcation. Calcified plaque at the carotid bifurcation and proximal ICA. Minimal diameter in the ICA bulb is approximately 3 mm. Compared to a more distal cervical ICA diameter of 5 mm, this indicates a 40% stenosis. Pronounced vessel tortuosity on this side as well. External carotid artery origin is severely stenotic. Vertebral arteries: Right vertebral artery is dominant. There is atherosclerotic plaque at the right vertebral artery origin with stenosis estimated at 70%. The vessel is widely patent beyond that through the cervical region. The small left vertebral artery shows wide patency at its origin and through the cervical region. Skeleton: Advanced cervical spondylosis. Other neck: No soft tissue mass or lymphadenopathy. Upper chest: Interstitial and alveolar pulmonary edema. Bilateral pleural effusions layering dependently. IMPRESSION: Advanced/extensive calcified plaque at the right carotid bifurcation and ICA bulb region. Patent luminal diameter 1 mm or less, consistent with 80% or greater stenosis. Distal ICA shows a small caliber secondary to reduced inflow. Atherosclerotic calcification at the left carotid bifurcation and ICA bulb. Maximal stenosis at the distal bulb level measures 40%. Severe stenosis of the proximal external carotid artery. Both carotid systems are markedly tortuous, the bifurcations being more medially situated than normal, close to the posterior aspect of the  hypopharynx. Interstitial and alveolar pulmonary edema with layering effusions. Electronically Signed   By: Paulina FusiMark  Shogry M.D.   On: 02/18/2018 21:15   Mr Brain Wo Contrast  Result Date: 02/20/2018 CLINICAL DATA:  84 y/o F; episode of right upper extremity sensory deficit, weakness, slurred speech. EXAM: MRI HEAD WITHOUT CONTRAST TECHNIQUE: Multiplanar, multiecho pulse sequences of the brain and surrounding structures were obtained without intravenous contrast. COMPARISON:  02/19/2018 CT and CTA of the head. 02/18/2018 CTA neck. FINDINGS: Brain: Subcentimeter foci of reduced diffusion are present within the left anterior insula and left anterior corona radiata compatible with acute/early subacute infarction. No associated hemorrhage or mass effect. Very small chronic infarctions are present in the left superior cerebellar hemisphere and left hemi pons. Small chronic cortical infarct in the right anterolateral frontal lobe and left medial occipital lobe. Severalnonspecific foci of T2 FLAIR hyperintense signal abnormality in subcortical and periventricular white matter are compatible withmildchronic microvascular ischemic changes for age. Mildbrain parenchymal volume loss. Vascular: Normal flow voids. Skull and upper cervical spine: Normal marrow signal. Sinuses/Orbits: Negative. Other: Bilateral intra-ocular lens replacement. IMPRESSION: 1. Subcentimeter foci of acute/early subacute infarction are present in left anterior insula and left anterior corona radiata. No associated hemorrhage or mass effect. 2. Small chronic infarctions are present in the right inferolateral frontal cortex, left hemi  pons, and left superior cerebellar hemisphere. 3. Background of mild for age chronic microvascular ischemic changes and parenchymal volume loss of the brain. These results will be called to the ordering clinician or representative by the Radiologist Assistant, and communication documented in the PACS or zVision Dashboard.  Electronically Signed   By: Mitzi Hansen M.D.   On: 02/20/2018 02:40    Medications:  I have reviewed the patient's current medications. Scheduled: . aspirin EC  81 mg Oral Daily  . atorvastatin  20 mg Oral QHS  . butamben-tetracaine-benzocaine      . enoxaparin (LOVENOX) injection  40 mg Subcutaneous Q24H  . famotidine  20 mg Oral BID  . fentaNYL      . furosemide  40 mg Oral Daily  . isosorbide mononitrate  30 mg Oral Q1200  . lidocaine      . midazolam      . mometasone-formoterol  2 puff Inhalation BID  . ranolazine  500 mg Oral BID  . sodium chloride flush      . ticagrelor  90 mg Oral BID    Assessment/Plan: No new neurological complaints.  MRI of the brain reviewed and reveals small, acute left anterior insular and anterior corona radiata infarcts. This is consistent with patient's presentation.  Suspect embolic etiology.  TEE shows EF of 55% with no evidence of thrombus.    Recommendations: 1. Loop monitor as an outpatient.   2. Follow up with neurology as an outpatient   LOS: 2 days   Thana Farr, MD Neurology [phone] 02/20/2018  11:22 AM

## 2018-02-20 NOTE — Progress Notes (Signed)
Received Md order to discharge patient to home, reviewed homes meds prescriptions  and follow up appointments with patient and daughters and all verbalized understanding

## 2018-02-20 NOTE — Plan of Care (Signed)
  Problem: Education: Goal: Knowledge of General Education information will improve Outcome: Progressing   Problem: Pain Managment: Goal: General experience of comfort will improve Outcome: Progressing   Problem: Safety: Goal: Ability to remain free from injury will improve Outcome: Progressing   Problem: Education: Goal: Knowledge of disease or condition will improve Outcome: Progressing Goal: Knowledge of secondary prevention will improve Outcome: Progressing Goal: Knowledge of patient specific risk factors addressed and post discharge goals established will improve Outcome: Progressing

## 2018-02-20 NOTE — Plan of Care (Signed)
  Problem: Education: Goal: Knowledge of General Education information will improve Outcome: Progressing   Problem: Pain Managment: Goal: General experience of comfort will improve Outcome: Progressing   Problem: Safety: Goal: Ability to remain free from injury will improve Outcome: Progressing   

## 2018-02-20 NOTE — Progress Notes (Signed)
*  PRELIMINARY RESULTS* Echocardiogram Echocardiogram Transesophageal has been performed.  Cristela BlueHege, Tajh Livsey 02/20/2018, 8:58 AM

## 2018-02-20 NOTE — Discharge Summary (Signed)
[facility] [place] [facility]   PATIENT NAME: Rose KellyMaggie [name]    MR#:  [id number]  DATE OF BIRTH:  05/30/1930  DATE OF ADMISSION:  02/17/2018   ADMITTING PHYSICIAN: Oralia Manisavid Willis, MD  DATE OF DISCHARGE:  02/20/18  PRIMARY CARE PHYSICIAN: Pennie BanterPatel, Bhavesh, MD   ADMISSION DIAGNOSIS:   Weakness [R53.1] Difficulty with speech [R47.9]  DISCHARGE DIAGNOSIS:   Principal Problem:   Sensory deficit, right Active Problems:   HTN (hypertension)   Hyperlipidemia   CAD (coronary artery disease)   GERD (gastroesophageal reflux disease)   Chronic diastolic heart failure (HCC)   COPD (chronic obstructive pulmonary disease) (HCC)   CVA (cerebral vascular accident) (HCC)   SECONDARY DIAGNOSIS:   Past Medical History:  Diagnosis Date  . Allergic rhinitis   . Anemia   . Arrhythmia    s/p ablation for A-fib  . Carpal tunnel syndrome   . CHF (congestive heart failure) (HCC)   . GERD (gastroesophageal reflux disease)   . Heart murmur   . Hyperlipidemia   . Hypertension   . Lumbar spine pain   . Nephrolithiasis   . Osteoarthritis   . Sleep apnea   . TIA (transient ischemic attack)     HOSPITAL COURSE:   84y/o F with PMH significant for Afib s/p ablation, CHF, hypertension, sleep apnea presenting with TIA symptoms  1. Acute CVA- MRI positive for acute infarct in left anterior insulin left anterior corona radiata.  Also right-sided old infarctions noted. -likely embolic, given persistence and scattered TIAs/strokes -On aspirin, allergy to Plavix- so added brillinta given her significant carotid stenosis -Appreciate cardiology consult.  TEE negative for any thrombus.  Normal EF, no PFO noted - will need a 30 day event monitor -Continue statin.  Will discharge with home health -Appreciate neurology consult  2. Right carotid stenosis-CT angiogram confirming severe stenosis on the right -Appreciate vascular consult. -Patient scheduled for right carotid  endarterectomy in 2 weeks.  Will need cardiac clearance prior to that.  Outpatient appointment set up  3. Hypertension-on Imdur, toprol and low dose losartan Hold hydralazine, lasix  at discharge  4. COPD-stable, continue inhalers   Will be discharged today.  Daughter updated at bedside   DISCHARGE CONDITIONS:   Guarded  CONSULTS OBTAINED:   Treatment Team:  Liborio NixonZaman, Mohammed N, MD Stegmayer, Renaldo FiddlerKimberly A, PA-C Reynolds, Leslie, MD Marcina MillardParaschos, Alexander, MD Dalia HeadingFath, Kenneth A, MD  DRUG ALLERGIES:   Allergies  Allergen Reactions  . Celecoxib Other (See Comments)    Unknown.  Marland Kitchen. Avapro [Irbesartan] Other (See Comments)    Unknown  . Lisinopril Other (See Comments)    Unknown  . Plavix [Clopidogrel Bisulfate] Other (See Comments)    Unknown  . Statins Other (See Comments)    Unknown  . Ultram [Tramadol Hcl] Other (See Comments)    Unknown   DISCHARGE MEDICATIONS:   Allergies as of 02/20/2018      Reactions   Celecoxib Other (See Comments)   Unknown.   Avapro [irbesartan] Other (See Comments)   Unknown   Lisinopril Other (See Comments)   Unknown   Plavix [clopidogrel Bisulfate] Other (See Comments)   Unknown   Statins Other (See Comments)   Unknown   Ultram [tramadol Hcl] Other (See Comments)   Unknown      Medication List    STOP taking these medications   furosemide 40 MG tablet Commonly known as:  LASIX   hydrALAZINE 10 MG tablet Commonly known as:  APRESOLINE  vitamin k 100 MCG tablet     TAKE these medications   acetaminophen 500 MG tablet Commonly known as:  TYLENOL Take 500-1,000 mg by mouth every 6 (six) hours as needed (pain).   albuterol 108 (90 Base) MCG/ACT inhaler Commonly known as:  PROVENTIL HFA;VENTOLIN HFA Inhale 2 puffs into the lungs every 6 (six) hours as needed for wheezing or shortness of breath.   aspirin EC 81 MG tablet Take 81 mg by mouth 3 (three) times a week.   atorvastatin 20 MG tablet Commonly known as:   LIPITOR Take 1 tablet (20 mg total) by mouth at bedtime. What changed:    medication strength  how much to take   budesonide-formoterol 80-4.5 MCG/ACT inhaler Commonly known as:  SYMBICORT Inhale 2 puffs into the lungs 2 (two) times daily.   Calcium-Magnesium-Zinc 333-133-5 MG Tabs Take 1 tablet by mouth once a week.   cholecalciferol 1000 units tablet Commonly known as:  VITAMIN D Take 1,000 Units by mouth daily.   docusate sodium 100 MG capsule Commonly known as:  COLACE Take 100 mg by mouth 2 (two) times daily.   isosorbide mononitrate 30 MG 24 hr tablet Commonly known as:  IMDUR Take 30 mg by mouth daily at 12 noon.   losartan 25 MG tablet Commonly known as:  COZAAR Take 25 mg by mouth every other day.   Melatonin 5 MG Caps Take 10 mg by mouth at bedtime.   metoprolol succinate 25 MG 24 hr tablet Commonly known as:  TOPROL-XL Take 25 mg by mouth at bedtime.   multivitamin tablet Take 1 tablet by mouth daily.   ranitidine 150 MG tablet Commonly known as:  ZANTAC TAKE ONE TABLET BY MOUTH TWICE DAILY HEARTBURN   ranolazine 500 MG 12 hr tablet Commonly known as:  RANEXA Take 500 mg by mouth 2 (two) times daily.   ticagrelor 90 MG Tabs tablet Commonly known as:  BRILINTA Take 1 tablet (90 mg total) by mouth 2 (two) times daily.   vitamin B-12 1000 MCG tablet Commonly known as:  CYANOCOBALAMIN Take 1,000 mcg by mouth daily.        DISCHARGE INSTRUCTIONS:   1. PCP f/u in 1-2 weeks 2. Cardiology f/u in 1 week for cardiac clearance for carotid surgery 3. Vascular f/u in 1-2 weeks  DIET:   Cardiac diet  ACTIVITY:   Activity as tolerated  OXYGEN:   Home Oxygen: No.  Oxygen Delivery: room air  DISCHARGE LOCATION:   home   If you experience worsening of your admission symptoms, develop shortness of breath, life threatening emergency, suicidal or homicidal thoughts you must seek medical attention immediately by calling 911 or calling your MD  immediately  if symptoms less severe.  You Must read complete instructions/literature along with all the possible adverse reactions/side effects for all the Medicines you take and that have been prescribed to you. Take any new Medicines after you have completely understood and accpet all the possible adverse reactions/side effects.   Please note  You were cared for by a hospitalist during your hospital stay. If you have any questions about your discharge medications or the care you received while you were in the hospital after you are discharged, you can call the unit and asked to speak with the hospitalist on call if the hospitalist that took care of you is not available. Once you are discharged, your primary care physician will handle any further medical issues. Please note that NO REFILLS for any discharge  medications will be authorized once you are discharged, as it is imperative that you return to your primary care physician (or establish a relationship with a primary care physician if you do not have one) for your aftercare needs so that they can reassess your need for medications and monitor your lab values.    On the day of Discharge:  VITAL SIGNS:   Blood pressure 121/71, pulse 87, temperature 97.8 F (36.6 C), temperature source Oral, resp. rate 17, height 5\' 5"  (1.651 m), weight 85.2 kg (187 lb 14.4 oz), SpO2 98 %.  PHYSICAL EXAMINATION:   GENERAL:  84 y.o.-year-old patient lying in the bed with no acute distress.  EYES: Pupils equal, round, reactive to light and accommodation. No scleral icterus. Extraocular muscles intact.  HEENT: Head atraumatic, normocephalic. Oropharynx and nasopharynx clear.  NECK:  Supple, no jugular venous distention. No thyroid enlargement, no tenderness.  LUNGS: Normal breath sounds bilaterally, no wheezing, rales,rhonchi or crepitation. No use of accessory muscles of respiration.  CARDIOVASCULAR: S1, S2 normal. No murmurs, rubs, or gallops.  ABDOMEN:  Soft, nontender, nondistended. Bowel sounds present. No organomegaly or mass.  EXTREMITIES: No pedal edema, cyanosis, or clubbing.  NEUROLOGIC: Cranial nerves II through XII are intact. Muscle strength 5/5 in all extremities. Sensation intact except slightly decreased on the right side. Gait not checked.  PSYCHIATRIC: The patient is alert and oriented x 3.  SKIN: No obvious rash, lesion, or ulcer.     DATA REVIEW:   CBC Recent Labs  Lab 02/19/18 0405  WBC 6.6  HGB 10.5*  HCT 32.6*  PLT 211    Chemistries  Recent Labs  Lab 02/19/18 0405  NA 139  K 4.1  CL 106  CO2 27  GLUCOSE 106*  BUN 25*  CREATININE 1.35*  CALCIUM 8.6*     Microbiology Results  Results for orders placed or performed during the hospital encounter of 01/05/10  Surgical pcr screen     Status: None   Collection Time: 01/05/10  7:01 AM  Result Value Ref Range Status   MRSA, PCR NEGATIVE NEGATIVE Final   Staphylococcus aureus  NEGATIVE Final    NEGATIVE        The Xpert SA Assay (FDA approved for NASAL specimens only), is one component of a comprehensive surveillance program.  It is not intended to diagnose infection nor to guide or monitor treatment.    RADIOLOGY:  Mr Brain 78 Contrast  Result Date: 02/20/2018 CLINICAL DATA:  84 y/o F; episode of right upper extremity sensory deficit, weakness, slurred speech. EXAM: MRI HEAD WITHOUT CONTRAST TECHNIQUE: Multiplanar, multiecho pulse sequences of the brain and surrounding structures were obtained without intravenous contrast. COMPARISON:  02/19/2018 CT and CTA of the head. 02/18/2018 CTA neck. FINDINGS: Brain: Subcentimeter foci of reduced diffusion are present within the left anterior insula and left anterior corona radiata compatible with acute/early subacute infarction. No associated hemorrhage or mass effect. Very small chronic infarctions are present in the left superior cerebellar hemisphere and left hemi pons. Small chronic cortical infarct in the  right anterolateral frontal lobe and left medial occipital lobe. Severalnonspecific foci of T2 FLAIR hyperintense signal abnormality in subcortical and periventricular white matter are compatible withmildchronic microvascular ischemic changes for age. Mildbrain parenchymal volume loss. Vascular: Normal flow voids. Skull and upper cervical spine: Normal marrow signal. Sinuses/Orbits: Negative. Other: Bilateral intra-ocular lens replacement. IMPRESSION: 1. Subcentimeter foci of acute/early subacute infarction are present in left anterior insula and left anterior corona radiata. No associated hemorrhage  or mass effect. 2. Small chronic infarctions are present in the right inferolateral frontal cortex, left hemi pons, and left superior cerebellar hemisphere. 3. Background of mild for age chronic microvascular ischemic changes and parenchymal volume loss of the brain. These results will be called to the ordering clinician or representative by the Radiologist Assistant, and communication documented in the PACS or zVision Dashboard. Electronically Signed   By: Mitzi Hansen M.D.   On: 02/20/2018 02:40     Management plans discussed with the patient, family and they are in agreement.  CODE STATUS:     Code Status Orders  (From admission, onward)        Start     Ordered   02/18/18 0112  Full code  Continuous     02/18/18 0111    Code Status History    Date Active Date Inactive Code Status Order ID Comments User Context   06/20/2016 1421 06/20/2016 1722 Full Code [id number]  Auburn Bilberry, MD ED   10/13/2015 1855 10/15/2015 1514 Full Code [id number]  Ramonita Lab, MD Inpatient    Advance Directive Documentation     Most Recent Value  Type of Advance Directive  Healthcare Power of Attorney, Living will  Pre-existing out of facility DNR order (yellow form or pink MOST form)  -  "MOST" Form in Place?  -      TOTAL TIME TAKING CARE OF THIS PATIENT: 39 minutes.    Enid Baas M.D on  02/20/2018 at 12:48 PM  Between 7am to 6pm - Pager - [phone]  After 6pm go to [email or web address] - Social research officer, government  [facility]  Office  [phone]  CC: Primary care physician; Pennie Banter, MD   Note: This dictation was prepared with Dragon dictation along with smaller phrase technology. Any transcriptional errors that result from this process are unintentional.

## 2018-02-20 NOTE — Care Management Note (Addendum)
Case Management Note  Patient Details  Name: Rose ResMaggie G Rosekrans MRN: [id number] Date of Birth: 02/29/1930  Subjective/Objective:  Spoke with daughter, Marylene LandMichelle Richmond, [phone]., who was at bedside. Discussed discharge planning and home health recommendations. Provided a list of home health agencies. She would like to speak to [place] with [facility]. Requested Barbara CowerJason speak with daughter at bedside.  Marcelino DusterMichelle states patient lives with daughter, Clydie BraunKaren. She uses a Museum/gallery exhibitions officerolator and is alert and oriented at baseline.                   Action/Plan: Home health set up with [facility] for PT, OT and RN, HHA  Expected Discharge Date:  02/20/18               Expected Discharge Plan:  Home w Home Health Services  In-House Referral:     Discharge planning Services  CM Consult  Post Acute Care Choice:  Home Health Choice offered to:  Adult Children  DME Arranged:    DME Agency:     HH Arranged:  RN, PT, OT, HHA HH Agency:  [facility]  Status of Service:  Completed, signed off  If discussed at Long Length of Stay Meetings, dates discussed:    Additional Comments:  Marily MemosLisa M Obediah Welles, RN 02/20/2018, 2:28 PM

## 2018-02-21 ENCOUNTER — Encounter: Payer: Self-pay | Admitting: Cardiology

## 2018-02-26 ENCOUNTER — Other Ambulatory Visit (INDEPENDENT_AMBULATORY_CARE_PROVIDER_SITE_OTHER): Payer: Self-pay | Admitting: Vascular Surgery

## 2018-02-26 ENCOUNTER — Telehealth: Payer: Self-pay

## 2018-02-26 NOTE — Telephone Encounter (Signed)
EMMI Follow-up: Report noted questions about discharge papers.  I talked with Rose [name] and she did not have her paperwork in front of her as she doesn't leave those things laying out.  We reviewed the follow-up appointments she has this week and the next week and to call Dr. Wyn Quakerew day prior to procedure to get the time for it. She was appreciative that I called as she has to have time to arrange transportation since she doesn't drive.  No other needs at this time.

## 2018-03-05 ENCOUNTER — Telehealth (INDEPENDENT_AMBULATORY_CARE_PROVIDER_SITE_OTHER): Payer: Self-pay

## 2018-03-05 NOTE — Telephone Encounter (Signed)
Morrie Sheldonshley from pre-op called to see if she could find out if the patient has been told to come to her procedure 2 hours before, because she has not had pre-op yet and she is on the schedule for 03/07/18 at 9:30

## 2018-03-06 MED ORDER — CEFAZOLIN SODIUM-DEXTROSE 2-4 GM/100ML-% IV SOLN
2.0000 g | INTRAVENOUS | Status: AC
  Administered 2018-03-07: 2 g via INTRAVENOUS
  Filled 2018-03-06: qty 100

## 2018-03-06 NOTE — Telephone Encounter (Signed)
Called the patient and she seemed to be confused. She stated that she hasn't received any information on this procedure and that she didn't know what time or date was was scheduled to be there. I gave her the time of the procedure and the date, which is tomorrow, and I asked her to be there by 7:30 in order to pre-op.

## 2018-03-06 NOTE — Telephone Encounter (Signed)
Alright I will call her also and make sure she doesn't have any questions.

## 2018-03-06 NOTE — Telephone Encounter (Signed)
Did you call the patient too inform them of this.

## 2018-03-06 NOTE — Anesthesia Pain Management Evaluation Note (Signed)
DR CALLWOOD CLEARED MOD TO HIGH RISK FOR ANY PROCEDURE. 03/05/18

## 2018-03-06 NOTE — Pre-Procedure Instructions (Signed)
Cleared by dr Juliann Parescallwood 03/05/18 mod to high risk

## 2018-03-07 ENCOUNTER — Inpatient Hospital Stay: Payer: Medicare Other

## 2018-03-07 ENCOUNTER — Inpatient Hospital Stay
Admission: RE | Admit: 2018-03-07 | Discharge: 2018-03-11 | DRG: 037 | Disposition: A | Discharge status: Home Health | Payer: Medicare Other | Source: Ambulatory Visit | Attending: Internal Medicine | Admitting: Internal Medicine

## 2018-03-07 ENCOUNTER — Encounter: Payer: Self-pay | Admitting: *Deleted

## 2018-03-07 ENCOUNTER — Encounter
Admission: RE | Disposition: A | Discharge status: Home Health | Payer: Self-pay | Source: Ambulatory Visit | Attending: Vascular Surgery

## 2018-03-07 ENCOUNTER — Other Ambulatory Visit: Payer: Self-pay

## 2018-03-07 DIAGNOSIS — I63239 Cerebral infarction due to unspecified occlusion or stenosis of unspecified carotid arteries: Secondary | ICD-10-CM | POA: Diagnosis present

## 2018-03-07 DIAGNOSIS — I739 Peripheral vascular disease, unspecified: Secondary | ICD-10-CM | POA: Diagnosis present

## 2018-03-07 DIAGNOSIS — M1990 Unspecified osteoarthritis, unspecified site: Secondary | ICD-10-CM | POA: Diagnosis present

## 2018-03-07 DIAGNOSIS — R079 Chest pain, unspecified: Secondary | ICD-10-CM

## 2018-03-07 DIAGNOSIS — I350 Nonrheumatic aortic (valve) stenosis: Secondary | ICD-10-CM | POA: Diagnosis present

## 2018-03-07 DIAGNOSIS — Z886 Allergy status to analgesic agent status: Secondary | ICD-10-CM

## 2018-03-07 DIAGNOSIS — I249 Acute ischemic heart disease, unspecified: Secondary | ICD-10-CM | POA: Diagnosis not present

## 2018-03-07 DIAGNOSIS — R0902 Hypoxemia: Secondary | ICD-10-CM | POA: Diagnosis present

## 2018-03-07 DIAGNOSIS — Z7951 Long term (current) use of inhaled steroids: Secondary | ICD-10-CM

## 2018-03-07 DIAGNOSIS — K219 Gastro-esophageal reflux disease without esophagitis: Secondary | ICD-10-CM | POA: Diagnosis present

## 2018-03-07 DIAGNOSIS — I248 Other forms of acute ischemic heart disease: Secondary | ICD-10-CM | POA: Diagnosis not present

## 2018-03-07 DIAGNOSIS — Z6832 Body mass index (BMI) 32.0-32.9, adult: Secondary | ICD-10-CM | POA: Diagnosis not present

## 2018-03-07 DIAGNOSIS — I25119 Atherosclerotic heart disease of native coronary artery with unspecified angina pectoris: Secondary | ICD-10-CM | POA: Diagnosis present

## 2018-03-07 DIAGNOSIS — Z7982 Long term (current) use of aspirin: Secondary | ICD-10-CM

## 2018-03-07 DIAGNOSIS — R05 Cough: Secondary | ICD-10-CM | POA: Diagnosis present

## 2018-03-07 DIAGNOSIS — I110 Hypertensive heart disease with heart failure: Secondary | ICD-10-CM | POA: Diagnosis present

## 2018-03-07 DIAGNOSIS — Z8673 Personal history of transient ischemic attack (TIA), and cerebral infarction without residual deficits: Secondary | ICD-10-CM | POA: Diagnosis not present

## 2018-03-07 DIAGNOSIS — Z96653 Presence of artificial knee joint, bilateral: Secondary | ICD-10-CM | POA: Diagnosis present

## 2018-03-07 DIAGNOSIS — G4730 Sleep apnea, unspecified: Secondary | ICD-10-CM | POA: Diagnosis present

## 2018-03-07 DIAGNOSIS — G4733 Obstructive sleep apnea (adult) (pediatric): Secondary | ICD-10-CM | POA: Diagnosis present

## 2018-03-07 DIAGNOSIS — J449 Chronic obstructive pulmonary disease, unspecified: Secondary | ICD-10-CM | POA: Diagnosis present

## 2018-03-07 DIAGNOSIS — E785 Hyperlipidemia, unspecified: Secondary | ICD-10-CM | POA: Diagnosis present

## 2018-03-07 DIAGNOSIS — I342 Nonrheumatic mitral (valve) stenosis: Secondary | ICD-10-CM | POA: Diagnosis present

## 2018-03-07 DIAGNOSIS — E669 Obesity, unspecified: Secondary | ICD-10-CM | POA: Diagnosis present

## 2018-03-07 DIAGNOSIS — I5033 Acute on chronic diastolic (congestive) heart failure: Secondary | ICD-10-CM | POA: Diagnosis present

## 2018-03-07 DIAGNOSIS — I6521 Occlusion and stenosis of right carotid artery: Principal | ICD-10-CM | POA: Diagnosis present

## 2018-03-07 DIAGNOSIS — F05 Delirium due to known physiological condition: Secondary | ICD-10-CM | POA: Diagnosis not present

## 2018-03-07 DIAGNOSIS — Z79899 Other long term (current) drug therapy: Secondary | ICD-10-CM

## 2018-03-07 HISTORY — DX: Prediabetes: R73.03

## 2018-03-07 HISTORY — PX: ENDARTERECTOMY: SHX5162

## 2018-03-07 LAB — CBC WITH DIFFERENTIAL/PLATELET
BASOS PCT: 0 %
Basophils Absolute: 0 10*3/uL (ref 0–0.1)
EOS ABS: 0.2 10*3/uL (ref 0–0.7)
EOS PCT: 2 %
HCT: 34.2 % — ABNORMAL LOW (ref 35.0–47.0)
Hemoglobin: 11.6 g/dL — ABNORMAL LOW (ref 12.0–16.0)
LYMPHS ABS: 0.8 10*3/uL — AB (ref 1.0–3.6)
Lymphocytes Relative: 9 %
MCH: 29.3 pg (ref 26.0–34.0)
MCHC: 33.9 g/dL (ref 32.0–36.0)
MCV: 86.5 fL (ref 80.0–100.0)
Monocytes Absolute: 0.5 10*3/uL (ref 0.2–0.9)
Monocytes Relative: 6 %
NEUTROS PCT: 83 %
Neutro Abs: 7.7 10*3/uL — ABNORMAL HIGH (ref 1.4–6.5)
PLATELETS: 255 10*3/uL (ref 150–440)
RBC: 3.95 MIL/uL (ref 3.80–5.20)
RDW: 14.9 % — ABNORMAL HIGH (ref 11.5–14.5)
WBC: 9.2 10*3/uL (ref 3.6–11.0)

## 2018-03-07 LAB — BASIC METABOLIC PANEL
Anion gap: 6 (ref 5–15)
BUN: 36 mg/dL — AB (ref 6–20)
CHLORIDE: 102 mmol/L (ref 101–111)
CO2: 30 mmol/L (ref 22–32)
Calcium: 9.2 mg/dL (ref 8.9–10.3)
Creatinine, Ser: 1.47 mg/dL — ABNORMAL HIGH (ref 0.44–1.00)
GFR calc non Af Amer: 31 mL/min — ABNORMAL LOW (ref >60)
GFR, EST AFRICAN AMERICAN: 36 mL/min — AB (ref >60)
Glucose, Bld: 115 mg/dL — ABNORMAL HIGH (ref 65–99)
Potassium: 3.9 mmol/L (ref 3.5–5.1)
SODIUM: 138 mmol/L (ref 135–145)

## 2018-03-07 LAB — ABO/RH: ABO/RH(D): A POS

## 2018-03-07 LAB — PROTIME-INR
INR: 0.99
PROTHROMBIN TIME: 13 s (ref 11.4–15.2)

## 2018-03-07 LAB — TYPE AND SCREEN
ABO/RH(D): A POS
ANTIBODY SCREEN: NEGATIVE

## 2018-03-07 LAB — APTT: aPTT: 35 seconds (ref 24–36)

## 2018-03-07 LAB — GLUCOSE, CAPILLARY
GLUCOSE-CAPILLARY: 132 mg/dL — AB (ref 65–99)
GLUCOSE-CAPILLARY: 119 mg/dL — AB (ref 65–99)
Glucose-Capillary: 95 mg/dL (ref 65–99)

## 2018-03-07 LAB — MRSA PCR SCREENING: MRSA BY PCR: NEGATIVE

## 2018-03-07 SURGERY — ENDARTERECTOMY, CAROTID
Anesthesia: General | Laterality: Right | Wound class: Clean

## 2018-03-07 MED ORDER — EVICEL 2 ML EX KIT
PACK | CUTANEOUS | Status: AC
  Filled 2018-03-07: qty 1

## 2018-03-07 MED ORDER — ONDANSETRON HCL 4 MG/2ML IJ SOLN
INTRAMUSCULAR | Status: AC
  Filled 2018-03-07: qty 2

## 2018-03-07 MED ORDER — FENTANYL CITRATE (PF) 100 MCG/2ML IJ SOLN
INTRAMUSCULAR | Status: AC
  Administered 2018-03-07: 25 ug via INTRAVENOUS
  Filled 2018-03-07: qty 2

## 2018-03-07 MED ORDER — MAGNESIUM SULFATE 2 GM/50ML IV SOLN: 2.0000 g | Freq: Every day | INTRAVENOUS | Status: DC | PRN

## 2018-03-07 MED ORDER — MELATONIN 5 MG PO TABS
10.0000 mg | ORAL_TABLET | Freq: Every day | ORAL | Status: DC
  Administered 2018-03-07 – 2018-03-10 (×4): 10 mg via ORAL
  Filled 2018-03-07 (×5): qty 2

## 2018-03-07 MED ORDER — LABETALOL HCL 5 MG/ML IV SOLN
10.0000 mg | INTRAVENOUS | Status: DC | PRN
  Administered 2018-03-08: 10 mg via INTRAVENOUS
  Filled 2018-03-07: qty 4

## 2018-03-07 MED ORDER — ONDANSETRON HCL 4 MG/2ML IJ SOLN
4.0000 mg | Freq: Four times a day (QID) | INTRAMUSCULAR | Status: DC | PRN
  Administered 2018-03-09: 4 mg via INTRAVENOUS
  Filled 2018-03-07: qty 2

## 2018-03-07 MED ORDER — OXYCODONE-ACETAMINOPHEN 5-325 MG PO TABS
1.0000 | ORAL_TABLET | ORAL | Status: DC | PRN
  Administered 2018-03-08: 1 via ORAL
  Administered 2018-03-09: 2 via ORAL
  Filled 2018-03-07: qty 1
  Filled 2018-03-07: qty 2

## 2018-03-07 MED ORDER — FAMOTIDINE 20 MG PO TABS: 10.0000 mg | ORAL_TABLET | Freq: Every day | ORAL | Status: DC

## 2018-03-07 MED ORDER — HYDRALAZINE HCL 20 MG/ML IJ SOLN: 5.0000 mg | INTRAMUSCULAR | Status: DC | PRN

## 2018-03-07 MED ORDER — LIDOCAINE HCL 1 % IJ SOLN
INTRAMUSCULAR | Status: DC | PRN
  Administered 2018-03-07: 10 mL

## 2018-03-07 MED ORDER — SUCCINYLCHOLINE CHLORIDE 20 MG/ML IJ SOLN
INTRAMUSCULAR | Status: DC | PRN
  Administered 2018-03-07: 100 mg via INTRAVENOUS

## 2018-03-07 MED ORDER — ROCURONIUM BROMIDE 100 MG/10ML IV SOLN
INTRAVENOUS | Status: DC | PRN
  Administered 2018-03-07: 45 mg via INTRAVENOUS
  Administered 2018-03-07: 5 mg via INTRAVENOUS

## 2018-03-07 MED ORDER — CALCIUM-MAGNESIUM-ZINC 333-133-5 MG PO TABS: 1.0000 | ORAL_TABLET | ORAL | Status: DC

## 2018-03-07 MED ORDER — ISOSORBIDE MONONITRATE ER 30 MG PO TB24
30.0000 mg | ORAL_TABLET | Freq: Every day | ORAL | Status: DC
  Administered 2018-03-08 – 2018-03-10 (×3): 30 mg via ORAL
  Filled 2018-03-07 (×3): qty 1

## 2018-03-07 MED ORDER — METOPROLOL TARTRATE 5 MG/5ML IV SOLN
2.0000 mg | INTRAVENOUS | Status: DC | PRN
  Filled 2018-03-07: qty 5

## 2018-03-07 MED ORDER — NITROGLYCERIN IN D5W 200-5 MCG/ML-% IV SOLN: 5.0000 ug/min | INTRAVENOUS | Status: DC

## 2018-03-07 MED ORDER — LIDOCAINE HCL (CARDIAC) PF 100 MG/5ML IV SOSY
PREFILLED_SYRINGE | INTRAVENOUS | Status: DC | PRN
  Administered 2018-03-07: 60 mg via INTRAVENOUS

## 2018-03-07 MED ORDER — LIDOCAINE HCL (PF) 2 % IJ SOLN
INTRAMUSCULAR | Status: AC
  Filled 2018-03-07: qty 10

## 2018-03-07 MED ORDER — FENTANYL CITRATE (PF) 100 MCG/2ML IJ SOLN
25.0000 ug | INTRAMUSCULAR | Status: DC | PRN
  Administered 2018-03-07 (×5): 25 ug via INTRAVENOUS

## 2018-03-07 MED ORDER — CEFAZOLIN SODIUM-DEXTROSE 2-3 GM-%(50ML) IV SOLR
INTRAVENOUS | Status: AC
  Filled 2018-03-07: qty 50

## 2018-03-07 MED ORDER — ADULT MULTIVITAMIN W/MINERALS CH
1.0000 | ORAL_TABLET | Freq: Every day | ORAL | Status: DC
  Administered 2018-03-08 – 2018-03-11 (×4): 1 via ORAL
  Filled 2018-03-07 (×4): qty 1

## 2018-03-07 MED ORDER — FENTANYL CITRATE (PF) 250 MCG/5ML IJ SOLN
INTRAMUSCULAR | Status: AC
  Filled 2018-03-07: qty 5

## 2018-03-07 MED ORDER — FAMOTIDINE IN NACL 20-0.9 MG/50ML-% IV SOLN: 20.0000 mg | Freq: Two times a day (BID) | INTRAVENOUS | Status: DC

## 2018-03-07 MED ORDER — FAMOTIDINE IN NACL 20-0.9 MG/50ML-% IV SOLN
20.0000 mg | INTRAVENOUS | Status: DC
  Administered 2018-03-07: 20 mg via INTRAVENOUS
  Filled 2018-03-07: qty 50

## 2018-03-07 MED ORDER — ALBUTEROL SULFATE (2.5 MG/3ML) 0.083% IN NEBU
2.5000 mg | INHALATION_SOLUTION | Freq: Four times a day (QID) | RESPIRATORY_TRACT | Status: DC | PRN
  Administered 2018-03-09: 2.5 mg via RESPIRATORY_TRACT
  Filled 2018-03-07: qty 3

## 2018-03-07 MED ORDER — EPHEDRINE SULFATE 50 MG/ML IJ SOLN
INTRAMUSCULAR | Status: DC | PRN
  Administered 2018-03-07 (×3): 10 mg via INTRAVENOUS

## 2018-03-07 MED ORDER — SODIUM CHLORIDE 0.9 % IV SOLN
INTRAVENOUS | Status: DC | PRN
  Administered 2018-03-07: 10:00:00 via INTRAVENOUS

## 2018-03-07 MED ORDER — ACETAMINOPHEN 325 MG PO TABS: 325.0000 mg | ORAL_TABLET | ORAL | Status: DC | PRN

## 2018-03-07 MED ORDER — PROPOFOL 10 MG/ML IV BOLUS
INTRAVENOUS | Status: DC | PRN
  Administered 2018-03-07: 100 mg via INTRAVENOUS

## 2018-03-07 MED ORDER — TICAGRELOR 90 MG PO TABS
90.0000 mg | ORAL_TABLET | Freq: Two times a day (BID) | ORAL | Status: DC
  Administered 2018-03-07 – 2018-03-11 (×8): 90 mg via ORAL
  Filled 2018-03-07 (×9): qty 1

## 2018-03-07 MED ORDER — MORPHINE SULFATE (PF) 4 MG/ML IV SOLN
2.0000 mg | INTRAVENOUS | Status: DC | PRN
  Administered 2018-03-07 – 2018-03-08 (×4): 3 mg via INTRAVENOUS
  Administered 2018-03-09: 2 mg via INTRAVENOUS
  Filled 2018-03-07 (×5): qty 1

## 2018-03-07 MED ORDER — METOPROLOL SUCCINATE ER 25 MG PO TB24
25.0000 mg | ORAL_TABLET | Freq: Every day | ORAL | Status: DC
  Administered 2018-03-08 – 2018-03-10 (×3): 25 mg via ORAL
  Filled 2018-03-07 (×3): qty 1

## 2018-03-07 MED ORDER — GUAIFENESIN-DM 100-10 MG/5ML PO SYRP
15.0000 mL | ORAL_SOLUTION | ORAL | Status: DC | PRN
  Administered 2018-03-08: 15 mL via ORAL
  Filled 2018-03-07 (×2): qty 15

## 2018-03-07 MED ORDER — MOMETASONE FURO-FORMOTEROL FUM 100-5 MCG/ACT IN AERO
2.0000 | INHALATION_SPRAY | Freq: Two times a day (BID) | RESPIRATORY_TRACT | Status: DC
  Administered 2018-03-07 – 2018-03-11 (×8): 2 via RESPIRATORY_TRACT
  Filled 2018-03-07 (×2): qty 8.8

## 2018-03-07 MED ORDER — ROCURONIUM BROMIDE 50 MG/5ML IV SOLN
INTRAVENOUS | Status: AC
  Filled 2018-03-07: qty 1

## 2018-03-07 MED ORDER — PROPOFOL 10 MG/ML IV BOLUS
INTRAVENOUS | Status: AC
  Filled 2018-03-07: qty 20

## 2018-03-07 MED ORDER — SODIUM CHLORIDE 0.9 % IV SOLN
INTRAVENOUS | Status: DC
Start: 2018-03-07 — End: 2018-03-07
  Administered 2018-03-07 (×2): via INTRAVENOUS

## 2018-03-07 MED ORDER — LIDOCAINE HCL 4 % MT SOLN
OROMUCOSAL | Status: DC | PRN
  Administered 2018-03-07: 4 mL via TOPICAL

## 2018-03-07 MED ORDER — DEXAMETHASONE SODIUM PHOSPHATE 10 MG/ML IJ SOLN
INTRAMUSCULAR | Status: AC
  Filled 2018-03-07: qty 1

## 2018-03-07 MED ORDER — ONDANSETRON HCL 4 MG/2ML IJ SOLN: 4.0000 mg | Freq: Once | INTRAMUSCULAR | Status: DC | PRN

## 2018-03-07 MED ORDER — ACETAMINOPHEN 500 MG PO TABS: 500.0000 mg | ORAL_TABLET | Freq: Four times a day (QID) | ORAL | Status: DC | PRN

## 2018-03-07 MED ORDER — LIDOCAINE HCL (PF) 1 % IJ SOLN
INTRAMUSCULAR | Status: AC
  Filled 2018-03-07: qty 30

## 2018-03-07 MED ORDER — SODIUM CHLORIDE 0.9 % IV SOLN: 500.0000 mL | Freq: Once | INTRAVENOUS | Status: DC | PRN

## 2018-03-07 MED ORDER — SUCCINYLCHOLINE CHLORIDE 20 MG/ML IJ SOLN
INTRAMUSCULAR | Status: AC
  Filled 2018-03-07: qty 1

## 2018-03-07 MED ORDER — DOCUSATE SODIUM 100 MG PO CAPS
100.0000 mg | ORAL_CAPSULE | Freq: Two times a day (BID) | ORAL | Status: DC
  Administered 2018-03-07 – 2018-03-11 (×8): 100 mg via ORAL
  Filled 2018-03-07 (×8): qty 1

## 2018-03-07 MED ORDER — ONDANSETRON HCL 4 MG/2ML IJ SOLN
INTRAMUSCULAR | Status: DC | PRN
  Administered 2018-03-07: 4 mg via INTRAVENOUS

## 2018-03-07 MED ORDER — SUGAMMADEX SODIUM 200 MG/2ML IV SOLN
INTRAVENOUS | Status: DC | PRN
  Administered 2018-03-07: 200 mg via INTRAVENOUS

## 2018-03-07 MED ORDER — DOCUSATE SODIUM 100 MG PO CAPS: 100.0000 mg | ORAL_CAPSULE | Freq: Every day | ORAL | Status: DC

## 2018-03-07 MED ORDER — SODIUM CHLORIDE 0.9 % IV SOLN
INTRAVENOUS | Status: DC | PRN
  Administered 2018-03-07: 50 ug/min via INTRAVENOUS

## 2018-03-07 MED ORDER — PHENOL 1.4 % MT LIQD
1.0000 | OROMUCOSAL | Status: DC | PRN
  Filled 2018-03-07: qty 177

## 2018-03-07 MED ORDER — PHENYLEPHRINE HCL 10 MG/ML IJ SOLN
INTRAMUSCULAR | Status: DC | PRN
  Administered 2018-03-07: 200 ug via INTRAVENOUS

## 2018-03-07 MED ORDER — HEPARIN SODIUM (PORCINE) 1000 UNIT/ML IJ SOLN
INTRAMUSCULAR | Status: DC | PRN
  Administered 2018-03-07: 6000 [IU] via INTRAVENOUS

## 2018-03-07 MED ORDER — ALUM & MAG HYDROXIDE-SIMETH 200-200-20 MG/5ML PO SUSP
15.0000 mL | ORAL | Status: DC | PRN
  Filled 2018-03-07: qty 30

## 2018-03-07 MED ORDER — ATORVASTATIN CALCIUM 20 MG PO TABS
20.0000 mg | ORAL_TABLET | Freq: Every day | ORAL | Status: DC
  Administered 2018-03-07 – 2018-03-10 (×4): 20 mg via ORAL
  Filled 2018-03-07 (×4): qty 1

## 2018-03-07 MED ORDER — ACETAMINOPHEN 325 MG RE SUPP
325.0000 mg | RECTAL | Status: DC | PRN
  Filled 2018-03-07: qty 2

## 2018-03-07 MED ORDER — FENTANYL CITRATE (PF) 100 MCG/2ML IJ SOLN
INTRAMUSCULAR | Status: DC | PRN
  Administered 2018-03-07 (×3): 50 ug via INTRAVENOUS

## 2018-03-07 MED ORDER — POTASSIUM CHLORIDE CRYS ER 20 MEQ PO TBCR: 20.0000 meq | EXTENDED_RELEASE_TABLET | Freq: Every day | ORAL | Status: DC | PRN

## 2018-03-07 MED ORDER — HEPARIN SODIUM (PORCINE) 1000 UNIT/ML IJ SOLN
INTRAMUSCULAR | Status: AC
  Filled 2018-03-07: qty 1

## 2018-03-07 MED ORDER — ASPIRIN EC 81 MG PO TBEC
81.0000 mg | DELAYED_RELEASE_TABLET | ORAL | Status: DC
  Administered 2018-03-07 – 2018-03-09 (×2): 81 mg via ORAL
  Filled 2018-03-07 (×2): qty 1

## 2018-03-07 MED ORDER — LOSARTAN POTASSIUM 25 MG PO TABS: 25.0000 mg | ORAL_TABLET | ORAL | Status: DC

## 2018-03-07 MED ORDER — VITAMIN B-12 1000 MCG PO TABS
1000.0000 ug | ORAL_TABLET | Freq: Every day | ORAL | Status: DC
  Administered 2018-03-08 – 2018-03-11 (×4): 1000 ug via ORAL
  Filled 2018-03-07 (×4): qty 1

## 2018-03-07 MED ORDER — FAMOTIDINE 20 MG PO TABS
ORAL_TABLET | ORAL | Status: AC
  Administered 2018-03-07: 20 mg via ORAL
  Filled 2018-03-07: qty 1

## 2018-03-07 MED ORDER — EVICEL 2 ML EX KIT
PACK | CUTANEOUS | Status: DC | PRN
  Administered 2018-03-07: 2 mL

## 2018-03-07 MED ORDER — CHLORHEXIDINE GLUCONATE CLOTH 2 % EX PADS: 6.0000 | MEDICATED_PAD | Freq: Once | CUTANEOUS | Status: DC

## 2018-03-07 MED ORDER — SODIUM CHLORIDE 0.9 % IV SOLN
INTRAVENOUS | Status: DC
  Administered 2018-03-07: 17:00:00 via INTRAVENOUS

## 2018-03-07 MED ORDER — DEXAMETHASONE SODIUM PHOSPHATE 10 MG/ML IJ SOLN
INTRAMUSCULAR | Status: DC | PRN
  Administered 2018-03-07: 5 mg via INTRAVENOUS

## 2018-03-07 MED ORDER — RANOLAZINE ER 500 MG PO TB12
500.0000 mg | ORAL_TABLET | Freq: Two times a day (BID) | ORAL | Status: DC
  Administered 2018-03-07 – 2018-03-11 (×8): 500 mg via ORAL
  Filled 2018-03-07 (×11): qty 1

## 2018-03-07 MED ORDER — CEFAZOLIN SODIUM-DEXTROSE 2-4 GM/100ML-% IV SOLN
2.0000 g | Freq: Three times a day (TID) | INTRAVENOUS | Status: AC
  Administered 2018-03-07 – 2018-03-08 (×2): 2 g via INTRAVENOUS
  Filled 2018-03-07 (×3): qty 100

## 2018-03-07 MED ORDER — FAMOTIDINE 20 MG PO TABS
20.0000 mg | ORAL_TABLET | Freq: Once | ORAL | Status: AC
Start: 2018-03-07 — End: 2018-03-07
  Administered 2018-03-07: 20 mg via ORAL

## 2018-03-07 MED ORDER — VITAMIN D 1000 UNITS PO TABS
1000.0000 [IU] | ORAL_TABLET | Freq: Every day | ORAL | Status: DC
  Administered 2018-03-08 – 2018-03-11 (×4): 1000 [IU] via ORAL
  Filled 2018-03-07 (×4): qty 1

## 2018-03-07 SURGICAL SUPPLY — 66 items
ADH SKN CLS APL DERMABOND .7 (GAUZE/BANDAGES/DRESSINGS) ×1
BAG DECANTER FOR FLEXI CONT (MISCELLANEOUS) ×3 IMPLANT
BLADE SURG 15 STRL LF DISP TIS (BLADE) ×1 IMPLANT
BLADE SURG 15 STRL SS (BLADE) ×3
BLADE SURG SZ11 CARB STEEL (BLADE) ×3 IMPLANT
BOOT SUTURE AID YELLOW STND (SUTURE) ×3 IMPLANT
BRUSH SCRUB EZ  4% CHG (MISCELLANEOUS) ×2
BRUSH SCRUB EZ 4% CHG (MISCELLANEOUS) ×1 IMPLANT
CANISTER SUCT 1200ML W/VALVE (MISCELLANEOUS) ×3 IMPLANT
DERMABOND ADVANCED (GAUZE/BANDAGES/DRESSINGS) ×2
DERMABOND ADVANCED .7 DNX12 (GAUZE/BANDAGES/DRESSINGS) ×1 IMPLANT
DRAPE INCISE IOBAN 66X45 STRL (DRAPES) ×3 IMPLANT
DRAPE LAPAROTOMY 77X122 PED (DRAPES) ×3 IMPLANT
DRAPE SHEET LG 3/4 BI-LAMINATE (DRAPES) ×3 IMPLANT
DRSG TEGADERM 4X4.75 (GAUZE/BANDAGES/DRESSINGS) IMPLANT
DRSG TELFA 3X8 NADH (GAUZE/BANDAGES/DRESSINGS) IMPLANT
DURAPREP 26ML APPLICATOR (WOUND CARE) ×3 IMPLANT
ELECT CAUTERY BLADE 6.4 (BLADE) ×3 IMPLANT
ELECT REM PT RETURN 9FT ADLT (ELECTROSURGICAL) ×3
ELECTRODE REM PT RTRN 9FT ADLT (ELECTROSURGICAL) ×1 IMPLANT
EVICEL AIRLESS SPRAY ACCES (MISCELLANEOUS) ×2 IMPLANT
GLOVE BIO SURGEON STRL SZ7 (GLOVE) ×9 IMPLANT
GLOVE INDICATOR 7.5 STRL GRN (GLOVE) ×3 IMPLANT
GOWN STRL REUS W/ TWL LRG LVL3 (GOWN DISPOSABLE) ×2 IMPLANT
GOWN STRL REUS W/ TWL XL LVL3 (GOWN DISPOSABLE) ×2 IMPLANT
GOWN STRL REUS W/TWL LRG LVL3 (GOWN DISPOSABLE) ×6
GOWN STRL REUS W/TWL XL LVL3 (GOWN DISPOSABLE) ×6
HEMOSTAT SURGICEL 2X3 (HEMOSTASIS) ×3 IMPLANT
IV NS 250ML (IV SOLUTION) ×3
IV NS 250ML BAXH (IV SOLUTION) ×1 IMPLANT
KIT TURNOVER KIT A (KITS) ×3 IMPLANT
LABEL OR SOLS (LABEL) ×3 IMPLANT
LOOP RED MAXI  1X406MM (MISCELLANEOUS) ×4
LOOP VESSEL MAXI 1X406 RED (MISCELLANEOUS) ×2 IMPLANT
LOOP VESSEL MINI 0.8X406 BLUE (MISCELLANEOUS) ×1 IMPLANT
LOOPS BLUE MINI 0.8X406MM (MISCELLANEOUS) ×2
NDL FILTER BLUNT 18X1 1/2 (NEEDLE) ×1 IMPLANT
NDL HYPO 25X1 1.5 SAFETY (NEEDLE) ×1 IMPLANT
NEEDLE FILTER BLUNT 18X 1/2SAF (NEEDLE) ×2
NEEDLE FILTER BLUNT 18X1 1/2 (NEEDLE) ×1 IMPLANT
NEEDLE HYPO 25X1 1.5 SAFETY (NEEDLE) ×3 IMPLANT
NS IRRIG 1000ML POUR BTL (IV SOLUTION) ×3 IMPLANT
PACK BASIN MAJOR ARMC (MISCELLANEOUS) ×3 IMPLANT
PAD DRESSING TELFA 3X8 NADH (GAUZE/BANDAGES/DRESSINGS) IMPLANT
PATCH CAROTID ECM VASC 1X10 (Prosthesis & Implant Heart) ×3 IMPLANT
PENCIL ELECTRO HAND CTR (MISCELLANEOUS) IMPLANT
SHUNT W TPORT 9FR PRUITT F3 (SHUNT) ×3 IMPLANT
SUT MNCRL 4-0 (SUTURE) ×3
SUT MNCRL 4-0 27XMFL (SUTURE) ×1
SUT PROLENE 6 0 BV (SUTURE) ×12 IMPLANT
SUT PROLENE 7 0 BV 1 (SUTURE) ×6 IMPLANT
SUT SILK 2 0 (SUTURE) ×3
SUT SILK 2-0 18XBRD TIE 12 (SUTURE) ×1 IMPLANT
SUT SILK 3 0 (SUTURE) ×3
SUT SILK 3-0 18XBRD TIE 12 (SUTURE) ×1 IMPLANT
SUT SILK 4 0 (SUTURE) ×3
SUT SILK 4-0 18XBRD TIE 12 (SUTURE) ×1 IMPLANT
SUT VIC AB 3-0 SH 27 (SUTURE) ×6
SUT VIC AB 3-0 SH 27X BRD (SUTURE) ×2 IMPLANT
SUTURE MNCRL 4-0 27XMF (SUTURE) ×1 IMPLANT
SYR 10ML LL (SYRINGE) ×6 IMPLANT
SYR 20CC LL (SYRINGE) ×3 IMPLANT
TOWEL OR 17X26 4PK STRL BLUE (TOWEL DISPOSABLE) ×3 IMPLANT
TRAY FOLEY W/METER SILVER 16FR (SET/KITS/TRAYS/PACK) ×3 IMPLANT
TUBING CONNECTING 10 (TUBING) IMPLANT
TUBING CONNECTING 10' (TUBING)

## 2018-03-07 NOTE — Anesthesia Preprocedure Evaluation (Addendum)
Anesthesia Evaluation  Patient identified by MRN, date of birth, ID band Patient awake    Reviewed: Allergy & Precautions, NPO status , Patient's Chart, lab work & pertinent test results, reviewed documented beta blocker date and time   Airway Mallampati: III  TM Distance: >3 FB     Dental  (+) Upper Dentures, Lower Dentures   Pulmonary sleep apnea , COPD,           Cardiovascular hypertension, Pt. on medications and Pt. on home beta blockers + CAD, + Peripheral Vascular Disease and +CHF  + Valvular Problems/Murmurs      Neuro/Psych  Headaches, TIA Neuromuscular disease CVA    GI/Hepatic GERD  Controlled,  Endo/Other    Renal/GU CRFRenal disease     Musculoskeletal  (+) Arthritis ,   Abdominal   Peds  Hematology  (+) anemia ,   Anesthesia Other Findings Hx of SVT. Hx of AF with ablation. Has renal art stent. Echo ok.   Reproductive/Obstetrics                           Anesthesia Physical Anesthesia Plan  ASA: III  Anesthesia Plan: General   Post-op Pain Management:    Induction: Intravenous  PONV Risk Score and Plan:   Airway Management Planned: Oral ETT  Additional Equipment:   Intra-op Plan:   Post-operative Plan:   Informed Consent: I have reviewed the patients History and Physical, chart, labs and discussed the procedure including the risks, benefits and alternatives for the proposed anesthesia with the patient or authorized representative who has indicated his/her understanding and acceptance.     Plan Discussed with: CRNA  Anesthesia Plan Comments:         Anesthesia Quick Evaluation

## 2018-03-07 NOTE — H&P (Signed)
[facility] History & Physical Update  The patient was interviewed and re-examined.  The patient's previous History and Physical has been reviewed and is unchanged. She has a critical right ICA stenosis and had a recent neurologic event. There is no change in the plan of care. We plan to proceed with the scheduled procedure, right CEA.  Festus BarrenJason Bama Hanselman, MD  03/07/2018, 9:25 AM

## 2018-03-07 NOTE — Transfer of Care (Signed)
Immediate Anesthesia Transfer of Care Note  Patient: Rose [name]  Procedure(s) Performed: ENDARTERECTOMY CAROTID (Right )  Patient Location: PACU  Anesthesia Type:General  Level of Consciousness: awake, alert  and oriented  Airway & Oxygen Therapy: Patient Spontanous Breathing and Patient connected to face mask oxygen  Post-op Assessment: Report given to RN and Post -op Vital signs reviewed and stable  Post vital signs: Reviewed and stable  Last Vitals:  Vitals Value Taken Time  BP 166/73 03/07/2018 12:07 PM  Temp    Pulse 86 03/07/2018 12:07 PM  Resp    SpO2 97 % 03/07/2018 12:07 PM  Vitals shown include unvalidated device data.  Last Pain:  Vitals:   03/07/18 0753  TempSrc: Tympanic  PainSc: 0-No pain         Complications: No apparent anesthesia complications

## 2018-03-07 NOTE — Anesthesia Post-op Follow-up Note (Signed)
Anesthesia QCDR form completed.        

## 2018-03-07 NOTE — Anesthesia Procedure Notes (Signed)
Procedure Name: Intubation Date/Time: 03/07/2018 9:54 AM Performed by: Gunnar Bulla, MD Pre-anesthesia Checklist: Patient identified, Emergency Drugs available, Suction available, Patient being monitored and Timeout performed Patient Re-evaluated:Patient Re-evaluated prior to induction Oxygen Delivery Method: Circle system utilized Preoxygenation: Pre-oxygenation with 100% oxygen Induction Type: IV induction Ventilation: Mask ventilation without difficulty Laryngoscope Size: Mac and 3 Grade View: Grade I Tube type: Oral Tube size: 7.0 mm Number of attempts: 1 Airway Equipment and Method: Stylet Placement Confirmation: ETT inserted through vocal cords under direct vision,  positive ETCO2 and CO2 detector Secured at: 21 cm Tube secured with: Tape Dental Injury: Teeth and Oropharynx as per pre-operative assessment

## 2018-03-07 NOTE — Op Note (Signed)
[facility]   OPERATIVE NOTE  PROCEDURE:   1.  right carotid endarterectomy with CorMatrix arterial patch reconstruction  PRE-OPERATIVE DIAGNOSIS: 1.  right carotid stenosis 2. Previous stroke  POST-OPERATIVE DIAGNOSIS: same as above   SURGEON: Festus BarrenJason Rosine Solecki, MD  ASSISTANT(S): Dr. Levora DredgeGregory Schnier, MD  ANESTHESIA: general  ESTIMATED BLOOD LOSS: 50 cc  FINDING(S): 1.  Right carotid plaque.  SPECIMEN(S):  Carotid plaque (sent to Pathology)  INDICATIONS:   Rose [name] is a 84 y.o. female who presents with right carotid stenosis of >85% and a small stroke 2-3 weeks ago.  I discussed with the patient the risks, benefits, and alternatives to carotid endarterectomy.  I discussed the differences between carotid stenting and carotid endarterectomy. I discussed the procedural details of carotid endarterectomy with the patient.  The patient is aware that the risks of carotid endarterectomy include but are not limited to: bleeding, infection, stroke, myocardial infarction, death, cranial nerve injuries both temporary and permanent, neck hematoma, possible airway compromise, labile blood pressure post-operatively, cerebral hyperperfusion syndrome, and possible need for additional interventions in the future. The patient is aware of the risks and agrees to proceed forward with the procedure.  DESCRIPTION: After full informed written consent was obtained from the patient, the patient was brought back to the operating room and placed supine upon the operating table.  Prior to induction, the patient received IV antibiotics.  After obtaining adequate anesthesia, the patient was placed into a modified beach chair position with a shoulder roll in place and the patient's neck slightly hyperextended and rotated away from the surgical site.  The patient was prepped in the standard fashion for a carotid endarterectomy.  I made an incision anterior to the sternocleidomastoid muscle and  dissected down through the subcutaneous tissue.  The platysmas was opened with electrocautery.  Then I dissected down to the internal jugular vein and facial vein.  The facial vein is ligated and divided between 2-0 silk ties.  This was dissected posteriorly until I obtained visualization of the common carotid artery.  This was dissected out and then a vessel loop was placed around the common carotid artery.  I then dissected in a periadventitial fashion along the common carotid artery up to the bifurcation.  I then identified the external carotid artery and the superior thyroid artery.  I placed a vessel loop around the superior thyroid artery, and I also dissected out the external carotid artery and placed a vessel loop around it. In the process of this dissection, the hypoglossal nerve was identified and protected from harm.  I then dissected out the internal carotid artery until I identified an area in the internal carotid artery clearly above the stenosis.  I dissected slightly distal to this area, and placed a vessel loop around the artery.  At this point, we gave the patient 6000 units of intravenous heparin.  After this was allowed to circulate for several minutes, I pulled up control on the vessel loops to clamp the internal carotid artery, external carotid artery, superior thyroid artery, and then the common carotid artery.  I then made an arteriotomy in the common carotid artery with a 11 blade, and extended the arteriotomy with a Potts scissor down into the common carotid artery, then I carried the arteriotomy through the bifurcation into the internal carotid artery until I reached an area that was not diseased.  At this point, I took the Pruitt-[place] shunt that previously been prepared and I inserted it into the  internal carotid artery first, and then into the common carotid artery taking care to flush and de-air prior to release of control. At this point, I started the endarterectomy in the common  carotid artery with a Penfield elevator and carried this dissection down into the common carotid artery circumferentially.  Then I transected the plaque at a segment where it was adherent and transected the plaque with Potts scissors.  I then carried this dissection up into the external carotid artery.  The plaque was extracted by unclamping the external carotid artery and performing an eversion endarterectomy.  The dissection was then carried into the internal carotid artery where a nice feathered end point was created with gentle traction.  I passed the plaque off the field as a specimen. At this point I removed all loose flecks and remaining disease possible.  At this point, I was satisfied that the minimal remaining disease was densely adherent to the wall and wall integrity was intact. The distal endpoint was tacked down with three 7-0 Prolene sutures.  I then fashioned a CorMatrix arterial patch for the artery and sewed it in place with two running stitch of 6-0 Prolene.  I started at the distal endpoint and ran one half the length of the arteriotomy.  I then cut and beveled the patch to an appropriate length to match the arteriotomy.  I started the second 6-0 Prolene at the proximal end point.  The medial suture line was completed and the lateral suture line was run approximately one quarter the length of the arteriotomy.  Prior to completing this patch angioplasty, I removed the shunt first from the internal carotid artery, from which there was excellent backbleeding, and clamped it.  Then I removed the shunt from the common carotid artery, from which there was excellent antegrade bleeding, and then clamped it.  At this point, I allowed the external carotid artery to backbleed, which was excellent.  Then I instilled heparinized saline in this patched artery and then completed the patch angioplasty in the usual fashion.  First, I released the clamp on the external carotid artery, then I released it on the  common carotid artery.  After waiting a few seconds, I then released it on the internal carotid artery. Several minutes of pressure were held and 6-0 Prolene patch sutures were used as need for hemostasis.  At this point, I placed Surgicel and Evicel topical hemostatic agents.  There was no more active bleeding in the surgical site.  The sternocleidomastoid space was closed with three interrupted 3-0 Vicryl sutures. I then reapproximated the platysma muscle with a running stitch of 3-0 Vicryl.  The skin was then closed with a running subcuticular 4-0 Monocryl.  The skin was then cleaned, dried and Dermabond was used to reinforce the skin closure.  The patient awakened and was taken to the recovery room in stable condition, following commands and moving all four extremities without any apparent deficits.    COMPLICATIONS: none  CONDITION: stable  Festus Barren  03/07/2018, 11:58 AM    This note was created with Dragon Medical transcription system. Any errors in dictation are purely unintentional.

## 2018-03-07 NOTE — Anesthesia Procedure Notes (Signed)
Arterial Line Insertion Start/End4/24/2019 10:07 AM, 03/07/2018 10:07 AM Performed by: Junious SilkNoles, Mark, CRNA, CRNA  Preanesthetic checklist: patient identified, IV checked, site marked, risks and benefits discussed, surgical consent, monitors and equipment checked, pre-op evaluation, timeout performed and anesthesia consent Left, radial was placed Catheter size: 20 G Hand hygiene performed  and maximum sterile barriers used  Allen's test indicative of satisfactory collateral circulation Attempts: 1 Procedure performed without using ultrasound guided technique. Following insertion, dressing applied. Post procedure assessment: normal  Patient tolerated the procedure well with no immediate complications.

## 2018-03-08 ENCOUNTER — Encounter: Payer: Self-pay | Admitting: Vascular Surgery

## 2018-03-08 LAB — BASIC METABOLIC PANEL
Anion gap: 6 (ref 5–15)
BUN: 26 mg/dL — AB (ref 6–20)
CO2: 24 mmol/L (ref 22–32)
Calcium: 8.1 mg/dL — ABNORMAL LOW (ref 8.9–10.3)
Chloride: 107 mmol/L (ref 101–111)
Creatinine, Ser: 1.23 mg/dL — ABNORMAL HIGH (ref 0.44–1.00)
GFR calc Af Amer: 45 mL/min — ABNORMAL LOW (ref >60)
GFR, EST NON AFRICAN AMERICAN: 39 mL/min — AB (ref >60)
Glucose, Bld: 116 mg/dL — ABNORMAL HIGH (ref 65–99)
POTASSIUM: 4.5 mmol/L (ref 3.5–5.1)
Sodium: 137 mmol/L (ref 135–145)

## 2018-03-08 LAB — CBC
HCT: 30.3 % — ABNORMAL LOW (ref 35.0–47.0)
Hemoglobin: 10 g/dL — ABNORMAL LOW (ref 12.0–16.0)
MCH: 29.2 pg (ref 26.0–34.0)
MCHC: 33.1 g/dL (ref 32.0–36.0)
MCV: 88.2 fL (ref 80.0–100.0)
PLATELETS: 206 10*3/uL (ref 150–440)
RBC: 3.43 MIL/uL — AB (ref 3.80–5.20)
RDW: 14.9 % — AB (ref 11.5–14.5)
WBC: 9.1 10*3/uL (ref 3.6–11.0)

## 2018-03-08 LAB — SURGICAL PATHOLOGY

## 2018-03-08 MED ORDER — FAMOTIDINE 20 MG PO TABS
20.0000 mg | ORAL_TABLET | Freq: Every day | ORAL | Status: DC
  Administered 2018-03-08 – 2018-03-10 (×3): 20 mg via ORAL
  Filled 2018-03-08 (×3): qty 1

## 2018-03-08 NOTE — Progress Notes (Signed)
RN notified Dr Wyn Quakerew of patient c/o of chest pain, RN administered prn IV morphine with improvement. Soft diet is still too hard for patient to swallow, family requested pureed diet.  MD gave order for routine ekg, change diet for pureed, "can continue using prn morphine for chest pain"

## 2018-03-08 NOTE — Progress Notes (Signed)
Dr Wyn Quakerew gave order to patient to be stepdown

## 2018-03-08 NOTE — Progress Notes (Signed)
PHARMACIST - PHYSICIAN COMMUNICATION  CONCERNING: IV to Oral Route Change Policy  RECOMMENDATION: This patient is receiving famotidine by the intravenous route.  Based on criteria approved by the Pharmacy and Therapeutics Committee, the intravenous medication(s) is/are being converted to the equivalent oral dose form(s).   DESCRIPTION: These criteria include:  The patient is eating (either orally or via tube) and/or has been taking other orally administered medications for a least 24 hours  The patient has no evidence of active gastrointestinal bleeding or impaired GI absorption (gastrectomy, short bowel, patient on TNA or NPO).  If you have questions about this conversion, please contact the Pharmacy Department  []   [phone]( [phone] )  [facility] [x]   [phone]( [phone] )  [facility] []   [phone]  [facility] []   [phone]( [phone] )  [facility] []   [phone]( [phone] )  [facility]   Coty Student L, [facility] CenterRPH 03/08/2018 8:29 AM

## 2018-03-08 NOTE — Anesthesia Postprocedure Evaluation (Signed)
Anesthesia Post Note  Patient: Rose [name]  Procedure(s) Performed: ENDARTERECTOMY CAROTID (Right )  Patient location during evaluation: ICU Anesthesia Type: General Level of consciousness: awake and alert Pain management: pain level controlled Vital Signs Assessment: post-procedure vital signs reviewed and stable Respiratory status: spontaneous breathing, nonlabored ventilation, respiratory function stable and patient connected to nasal cannula oxygen Cardiovascular status: blood pressure returned to baseline and stable Postop Assessment: no apparent nausea or vomiting and adequate PO intake Anesthetic complications: no     Last Vitals:  Vitals:   03/08/18 0500 03/08/18 0600  BP: (!) 160/93 (!) 156/83  Pulse: 90 92  Resp: 10 14  Temp:    SpO2: 96% 97%    Last Pain:  Vitals:   03/07/18 1700  TempSrc: Axillary  PainSc: 0-No pain                 Rose [name],  Rose [name]

## 2018-03-08 NOTE — Progress Notes (Signed)
[facility]  Daily Progress Note   Subjective  - 1 Day Post-Op  Sleepy now.  Had some pain earlier and got morphine.  No major events  Objective Vitals:   03/08/18 0900 03/08/18 1000 03/08/18 1030 03/08/18 1100  BP: (!) 150/84 (!) 177/83 111/77 131/79  Pulse: 96 96 78 85  Resp: 15 12 13 11   Temp:      TempSrc:      SpO2: 96% 96% 93% 93%  Weight:      Height:        Intake/Output Summary (Last 24 hours) at 03/08/2018 1114 Last data filed at 03/08/2018 0430 Gross per 24 hour  Intake 2170 ml  Output 685 ml  Net 1485 ml    PULM  CTAB CV  RRR VASC  Neck with mild swelling, moderate bruising.  Neuro exam appears intact EKG no changes  Laboratory CBC    Component Value Date/Time   WBC 9.1 03/08/2018 0536   HGB 10.0 (L) 03/08/2018 0536   HGB 10.9 (L) 02/05/2015 0848   HCT 30.3 (L) 03/08/2018 0536   HCT 34.0 (L) 02/05/2015 0848   PLT 206 03/08/2018 0536   PLT 189 02/05/2015 0848    BMET    Component Value Date/Time   NA 137 03/08/2018 0536   NA 139 02/05/2015 0848   K 4.5 03/08/2018 0536   K 4.2 02/05/2015 0848   CL 107 03/08/2018 0536   CL 101 02/05/2015 0848   CO2 24 03/08/2018 0536   CO2 31 02/05/2015 0848   GLUCOSE 116 (H) 03/08/2018 0536   GLUCOSE 92 02/05/2015 0848   BUN 26 (H) 03/08/2018 0536   BUN 23 (H) 02/05/2015 0848   CREATININE 1.23 (H) 03/08/2018 0536   CREATININE 1.05 (H) 02/05/2015 0848   CALCIUM 8.1 (L) 03/08/2018 0536   CALCIUM 9.1 02/05/2015 0848   GFRNONAA 39 (L) 03/08/2018 0536   GFRNONAA 49 (L) 02/05/2015 0848   GFRAA 45 (L) 03/08/2018 0536   GFRAA 57 (L) 02/05/2015 0848    Assessment/Planning: POD #1 s/p right CEA   Overall doing ok  Likely discharge home tomorrow.  Will keep another night  Not swallowing well so will do puree diet  Saline lock iv  Increase activity    Rose BarrenJason Tewana [name]  03/08/2018, 11:14 AM

## 2018-03-09 ENCOUNTER — Inpatient Hospital Stay: Payer: Medicare Other

## 2018-03-09 LAB — TROPONIN I
TROPONIN I: 0.05 ng/mL — AB (ref <0.03)
TROPONIN I: 0.07 ng/mL — AB (ref <0.03)
TROPONIN I: 0.12 ng/mL — AB (ref <0.03)

## 2018-03-09 MED ORDER — GUAIFENESIN ER 600 MG PO TB12
600.0000 mg | ORAL_TABLET | Freq: Two times a day (BID) | ORAL | Status: DC
  Administered 2018-03-09 – 2018-03-11 (×5): 600 mg via ORAL
  Filled 2018-03-09 (×5): qty 1

## 2018-03-09 MED ORDER — FUROSEMIDE 10 MG/ML IJ SOLN
40.0000 mg | Freq: Two times a day (BID) | INTRAMUSCULAR | Status: DC
  Administered 2018-03-09 – 2018-03-10 (×2): 40 mg via INTRAVENOUS
  Filled 2018-03-09 (×2): qty 4

## 2018-03-09 MED ORDER — FUROSEMIDE 10 MG/ML IJ SOLN
20.0000 mg | Freq: Once | INTRAMUSCULAR | Status: AC
  Administered 2018-03-09: 20 mg via INTRAVENOUS
  Filled 2018-03-09: qty 2

## 2018-03-09 NOTE — Progress Notes (Signed)
SaO2 87-88% on 4L.  No improvement with O2 increased to 6L.  Dr Wyn Quakerew notified of pt assessment.  Lasix 20mg  IV ordered.

## 2018-03-09 NOTE — Consult Note (Signed)
[facility] - [place] at [facility]   PATIENT NAME: Rose [name]    MR#:  [id number]  DATE OF BIRTH:  09/25/1930  DATE OF ADMISSION:  03/07/2018  PRIMARY CARE PHYSICIAN: Pennie Banter, MD   REQUESTING/REFERRING PHYSICIAN: Festus Barren  MD  CHIEF COMPLAINT:  No chief complaint on file.   HISTORY OF PRESENT ILLNESS: Rose [name]  is a 84 y.o. female with a known history of diastolic CHF, GERD, hyperlipidemia, hypertension, sleep apnea and a recent CVA Who underwent right carotid endarterectomy.  Patient since yesterday has developed cough and shortness of breath.  Earlier she was also complaining of chest pain.  She had a chest x-ray which showed congestive heart failure.  Patient complains of no further chest pain but continues to complain of cough and shortness of breath.    PAST MEDICAL HISTORY:   Past Medical History:  Diagnosis Date  . Allergic rhinitis   . Anemia   . Arrhythmia    s/p ablation for A-fib  . Carpal tunnel syndrome   . CHF (congestive heart failure) (HCC)   . GERD (gastroesophageal reflux disease)   . Heart murmur   . Hyperlipidemia   . Hypertension   . Lumbar spine pain   . Nephrolithiasis   . Osteoarthritis   . Pre-diabetes   . Sleep apnea   . TIA (transient ischemic attack)     PAST SURGICAL HISTORY:  Past Surgical History:  Procedure Laterality Date  . ABDOMINAL HYSTERECTOMY    . ablation  January 2011   A-fib  . BACK SURGERY    . CARDIAC CATHETERIZATION    . ENDARTERECTOMY Right 03/07/2018   Procedure: ENDARTERECTOMY CAROTID;  Surgeon: Annice Needy, MD;  Location: [facility] ORS;  Service: Vascular;  Laterality: Right;  . ESOPHAGOGASTRODUODENOSCOPY N/A 05/22/2015   Procedure: ESOPHAGOGASTRODUODENOSCOPY (EGD);  Surgeon: Scot Jun, MD;  Location: [facility] ENDOSCOPY;  Service: Endoscopy;  Laterality: N/A;  . RENAL ARTERY STENT    . REPLACEMENT TOTAL KNEE BILATERAL    . SAVORY DILATION N/A 05/22/2015   Procedure: SAVORY DILATION;   Surgeon: Scot Jun, MD;  Location: [facility] ENDOSCOPY;  Service: Endoscopy;  Laterality: N/A;  . TEE WITHOUT CARDIOVERSION N/A 05/26/2017   Procedure: TRANSESOPHAGEAL ECHOCARDIOGRAM (TEE);  Surgeon: Alwyn Pea, MD;  Location: [facility] ORS;  Service: Cardiovascular;  Laterality: N/A;  . TEE WITHOUT CARDIOVERSION N/A 02/20/2018   Procedure: TRANSESOPHAGEAL ECHOCARDIOGRAM (TEE);  Surgeon: Dalia Heading, MD;  Location: [facility] ORS;  Service: Cardiovascular;  Laterality: N/A;  . TONSILLECTOMY      SOCIAL HISTORY:  Social History   Tobacco Use  . Smoking status: Never Smoker  . Smokeless tobacco: Never Used  Substance Use Topics  . Alcohol use: No    FAMILY HISTORY:  Family History  Problem Relation Age of Onset  . Diabetes Father   . Hypertension Mother     DRUG ALLERGIES:  Allergies  Allergen Reactions  . Celecoxib Other (See Comments)    Unknown.  Marland Kitchen Avapro [Irbesartan] Other (See Comments)    Unknown  . Lisinopril Other (See Comments)    Unknown  . Plavix [Clopidogrel Bisulfate] Other (See Comments)    Unknown  . Statins Other (See Comments)    Unknown  . Ultram [Tramadol Hcl] Other (See Comments)    Unknown    REVIEW OF SYSTEMS:   CONSTITUTIONAL: No fever, fatigue or weakness.  EYES: No blurred or double vision.  EARS, NOSE, AND THROAT: No tinnitus or ear pain.  RESPIRATORY: No cough, positive shortness of breath, wheezing or hemoptysis.  CARDIOVASCULAR: No chest pain, positive orthopnea, edema.  GASTROINTESTINAL: No nausea, vomiting, diarrhea or abdominal pain.  GENITOURINARY: No dysuria, hematuria.  ENDOCRINE: No polyuria, nocturia,  HEMATOLOGY: No anemia, easy bruising or bleeding SKIN: No rash or lesion. MUSCULOSKELETAL: No joint pain or arthritis.   NEUROLOGIC: No tingling, numbness, weakness.  PSYCHIATRY: No anxiety or depression.   MEDICATIONS AT HOME:  Prior to Admission medications   Medication Sig Start Date End Date Taking? Authorizing Provider   acetaminophen (TYLENOL) 500 MG tablet Take 500-1,000 mg by mouth every 6 (six) hours as needed (pain).   Yes [provider]  albuterol (PROVENTIL HFA;VENTOLIN HFA) 108 (90 BASE) MCG/ACT inhaler Inhale 2 puffs into the lungs every 6 (six) hours as needed for wheezing or shortness of breath.   Yes [provider]  aspirin EC 81 MG tablet Take 81 mg by mouth 3 (three) times a week.    Yes [provider]  atorvastatin (LIPITOR) 20 MG tablet Take 1 tablet (20 mg total) by mouth at bedtime. 02/20/18  Yes Enid Baas, MD  budesonide-formoterol (SYMBICORT) 80-4.5 MCG/ACT inhaler Inhale 2 puffs into the lungs 2 (two) times daily.   Yes [provider]  cholecalciferol (VITAMIN D) 1000 units tablet Take 1,000 Units by mouth daily.   Yes [provider]  docusate sodium (COLACE) 100 MG capsule Take 100 mg by mouth 2 (two) times daily.   Yes [provider]  isosorbide mononitrate (IMDUR) 30 MG 24 hr tablet Take 30 mg by mouth daily at 12 noon.  03/15/17 03/15/18 Yes [provider]  Melatonin 5 MG CAPS Take 10 mg by mouth at bedtime.    Yes [provider]  metoprolol succinate (TOPROL-XL) 25 MG 24 hr tablet Take 25 mg by mouth at bedtime.    Yes [provider]  Multiple Vitamin (MULTIVITAMIN) tablet Take 1 tablet by mouth daily.   Yes [provider]  ranitidine (ZANTAC) 150 MG tablet TAKE ONE TABLET BY MOUTH TWICE DAILY HEARTBURN 07/14/16  Yes [provider]  ranolazine (RANEXA) 500 MG 12 hr tablet Take 500 mg by mouth 2 (two) times daily.   Yes [provider]  ticagrelor (BRILINTA) 90 MG TABS tablet Take 1 tablet (90 mg total) by mouth 2 (two) times daily. 02/20/18  Yes Enid Baas, MD  vitamin B-12 (CYANOCOBALAMIN) 1000 MCG tablet Take 1,000 mcg by mouth daily.   Yes [provider]  Calcium-Magnesium-Zinc [phone] MG TABS Take 1 tablet by mouth once a week.    [provider]  losartan (COZAAR) 25 MG tablet Take 25 mg by mouth every other day.     [provider]      PHYSICAL EXAMINATION:   VITAL SIGNS: Blood pressure 118/67, pulse (!) 104, temperature 98.2 F (36.8 C), temperature source Oral, resp. rate 19, height 5\' 4"  (1.626 m), weight 89.1 kg (196 lb 6.9 oz), SpO2 91 %.  GENERAL:  84 y.o.-year-old patient lying in the bed with no acute distress.  EYES: Pupils equal, round, reactive to light and accommodation. No scleral icterus. Extraocular muscles intact.  HEENT: Head atraumatic, normocephalic. Oropharynx and nasopharynx clear.  NECK:  Supple, no jugular venous distention. No thyroid enlargement, no tenderness.  Status post surgery LUNGS: Crackles at the bases, no accessory muscle usage no wheezing CARDIOVASCULAR: S1, S2 normal. No murmurs, rubs, or gallops.  ABDOMEN: Soft, nontender, nondistended. Bowel sounds present. No organomegaly or mass.  EXTREMITIES: No pedal edema, cyanosis, or clubbing.  NEUROLOGIC: Cranial nerves II through XII are intact. Muscle strength 5/5 in all extremities. Sensation intact. Gait not checked.  PSYCHIATRIC: The patient is alert and oriented x 3.  SKIN: No obvious rash, lesion, or ulcer.   LABORATORY PANEL:   CBC Recent Labs  Lab 03/07/18 0758 03/08/18 0536  WBC 9.2 9.1  HGB 11.6* 10.0*  HCT 34.2* 30.3*  PLT 255 206  MCV 86.5 88.2  MCH 29.3 29.2  MCHC 33.9 33.1  RDW 14.9* 14.9*  LYMPHSABS 0.8*  --   MONOABS 0.5  --   EOSABS 0.2  --   BASOSABS 0.0  --    ------------------------------------------------------------------------------------------------------------------  Chemistries  Recent Labs  Lab 03/07/18 0758 03/08/18 0536  NA 138 137  K 3.9 4.5  CL 102 107  CO2 30 24  GLUCOSE 115* 116*  BUN 36* 26*  CREATININE 1.47* 1.23*  CALCIUM 9.2 8.1*   ------------------------------------------------------------------------------------------------------------------ estimated  creatinine clearance is 35.5 mL/min (A) (by C-G formula based on SCr of 1.23 mg/dL (H)). ------------------------------------------------------------------------------------------------------------------ No results for input(s): TSH, T4TOTAL, T3FREE, THYROIDAB in the last 72 hours.  Invalid input(s): FREET3   Coagulation profile Recent Labs  Lab 03/07/18 0758  INR 0.99   ------------------------------------------------------------------------------------------------------------------- No results for input(s): DDIMER in the last 72 hours. -------------------------------------------------------------------------------------------------------------------  Cardiac Enzymes Recent Labs  Lab 03/09/18 1000  TROPONINI 0.05*   ------------------------------------------------------------------------------------------------------------------ Invalid input(s): POCBNP  ---------------------------------------------------------------------------------------------------------------  Urinalysis    Component Value Date/Time   COLORURINE AMBER (A) 02/17/2018 2300   APPEARANCEUR HAZY (A) 02/17/2018 2300   APPEARANCEUR Hazy 12/13/2014 0956   LABSPEC >1.046 (H) 02/17/2018 2300   LABSPEC 1.012 12/13/2014 0956   PHURINE 5.0 02/17/2018 2300   GLUCOSEU NEGATIVE 02/17/2018 2300   GLUCOSEU Negative 12/13/2014 0956   HGBUR NEGATIVE 02/17/2018 2300   BILIRUBINUR NEGATIVE 02/17/2018 2300   BILIRUBINUR Negative 12/13/2014 0956   KETONESUR NEGATIVE 02/17/2018 2300   PROTEINUR NEGATIVE 02/17/2018 2300   NITRITE NEGATIVE 02/17/2018 2300   LEUKOCYTESUR NEGATIVE 02/17/2018 2300   LEUKOCYTESUR Negative 12/13/2014 0956     RADIOLOGY: Dg Chest Port 1 View  Result Date: 03/09/2018 CLINICAL DATA:  Chest pain. EXAM: PORTABLE CHEST 1 VIEW COMPARISON:  Radiograph of February 18, 2018. FINDINGS: Stable cardiomegaly. Atherosclerosis of thoracic aorta is noted. No pneumothorax is noted. Increased bibasilar opacities  are noted most consistent with edema with mild associated pleural effusions. Bony thorax is unremarkable. IMPRESSION: Increased bibasilar edema is noted with mild associated pleural effusions suggesting congestive heart failure. Aortic Atherosclerosis (ICD10-I70.0). Electronically Signed   By: Lupita RaiderJames  Green Jr, M.D.   On: 03/09/2018 09:31    EKG: Orders placed or performed during the hospital encounter of 03/07/18  . EKG 12-Lead  . EKG 12-Lead  . EKG 12-Lead  . EKG 12-Lead    IMPRESSION AND PLAN: Patient is a 84 year old white female with history of diastolic CHF status post right carotid endarterectomy  1.  Acute diastolic CHF I will treat patient with IV Lasix monitor ins and outs Recent echo was done no need to repeat  2.  Chest pain we will cycle cardiac enzymes if troponin is further elevated ask her cardiologist to see  3.  History of asthma continue inhalers no active exasperation I will add PRN nebs  4.  Hyperlipidemia continue Lipitor  5.  Coronary artery disease continue Imdur and metoprolol and Brilinta  6.  Cough we will give her antitussive medications placed on some Mucinex  7.  CODE  STATUS full code   All the records are reviewed and case discussed with ED provider. Management plans discussed with the patient, family and they are in agreement.  CODE STATUS:    Code Status Orders  (From admission, onward)        Start     Ordered   03/07/18 1606  Full code  Continuous     03/07/18 1606    Code Status History    Date Active Date Inactive Code Status Order ID Comments User Context   02/18/2018 0111 02/20/2018 2107 Full Code [id number]  Oralia Manis, MD Inpatient   06/20/2016 1421 06/20/2016 1722 Full Code [id number]  Auburn Bilberry, MD ED   10/13/2015 1855 10/15/2015 1514 Full Code [id number]  Ramonita Lab, MD Inpatient    Advance Directive Documentation     Most Recent Value  Type of Advance Directive  Healthcare Power of Attorney, Living will  Pre-existing  out of facility DNR order (yellow form or pink MOST form)  -  "MOST" Form in Place?  -       TOTAL TIME TAKING CARE OF THIS PATIENT: 55 minutes.    Auburn Bilberry M.D on 03/09/2018 at 2:30 PM  Between 7am to 6pm - Pager - [phone]  After 6pm go to [email or web address] - password [email or web address]  [facility] Office  [phone]  CC: Primary care physician; Pennie Banter, MD

## 2018-03-09 NOTE — Progress Notes (Signed)
[facility]  Daily Progress Note   Subjective  - 2 Days Post-Op  Patient complaining of chest pain this morning.  Had some desaturations overnight.  Responded to Lasix and sats are better this am. Neuro exam is intact.  Some sundowning overnight but oriented this am  Objective Vitals:   03/09/18 0400 03/09/18 0500 03/09/18 0600 03/09/18 0700  BP: 121/67 (!) 124/59 121/63 118/67  Pulse: 100 93 94 (!) 104  Resp: 17 16 18 19   Temp: 98.2 F (36.8 C)     TempSrc: Oral     SpO2: 93% 92% 92% 91%  Weight:      Height:        Intake/Output Summary (Last 24 hours) at 03/09/2018 0938 Last data filed at 03/09/2018 0600 Gross per 24 hour  Intake 400 ml  Output 1225 ml  Net -825 ml    PULM  CTAB CV  A little tachycardic VASC  Neck with mild to moderate bruising and swelling, stable  Laboratory CBC    Component Value Date/Time   WBC 9.1 03/08/2018 0536   HGB 10.0 (L) 03/08/2018 0536   HGB 10.9 (L) 02/05/2015 0848   HCT 30.3 (L) 03/08/2018 0536   HCT 34.0 (L) 02/05/2015 0848   PLT 206 03/08/2018 0536   PLT 189 02/05/2015 0848    BMET    Component Value Date/Time   NA 137 03/08/2018 0536   NA 139 02/05/2015 0848   K 4.5 03/08/2018 0536   K 4.2 02/05/2015 0848   CL 107 03/08/2018 0536   CL 101 02/05/2015 0848   CO2 24 03/08/2018 0536   CO2 31 02/05/2015 0848   GLUCOSE 116 (H) 03/08/2018 0536   GLUCOSE 92 02/05/2015 0848   BUN 26 (H) 03/08/2018 0536   BUN 23 (H) 02/05/2015 0848   CREATININE 1.23 (H) 03/08/2018 0536   CREATININE 1.05 (H) 02/05/2015 0848   CALCIUM 8.1 (L) 03/08/2018 0536   CALCIUM 9.1 02/05/2015 0848   GFRNONAA 39 (L) 03/08/2018 0536   GFRNONAA 49 (L) 02/05/2015 0848   GFRAA 45 (L) 03/08/2018 0536   GFRAA 57 (L) 02/05/2015 0848    Assessment/Planning: POD #2 s/p right CEA   Will order troponin and another EKG for today.  Yesterday's EKG was unrevealing.  Will ask medicine to see the patient.  She is having an acute  coronary syndrome, will defer management to medicine and will get cardiology involved.  If she does need to be placed on anticoagulation, would prefer this be done without a bolus to avoid bleeding from her surgical site.    Rose [name]  03/09/2018, 9:38 AM

## 2018-03-09 NOTE — Progress Notes (Signed)
Pt w/fine bilat wheeze, fine crackles and diminished lung sounds.  RT contacted for med neb tx which was administered.  Pt cont to have SaO2 in the upper 80s.  Repositioned pt again and SaO2=95% on 4L.  Cont to monitor.

## 2018-03-09 NOTE — Progress Notes (Signed)
Advanced care plan.  Purpose of the Encounter: CODE STATUS  Parties in Attendance: Patient and her daughter  Patient's Decision Capacity: Intact  Subjective/Patient's story: Patient is a 84 year old female with history of diastolic CHF, GERD, hyperlipidemia and hypertension status post carotid enterectomy   Objective/Medical story I discussed with the patient and family regarding her CODE STATUS.  Patient and her family would like her to be resuscitated and intubated if needed    Goals of care determination: Full code    CODE STATUS:  Full code  Time spent discussing advanced care planning: 16 minutes

## 2018-03-09 NOTE — Progress Notes (Signed)
Discussed with Dr. Wyn Quakerew regarding patient, patient surgically stable he would like to transfer the patient to our service I will go ahead and transfer the patient to the medical service

## 2018-03-09 NOTE — Progress Notes (Addendum)
Patient transferred to Room 249.  Report given to RN.  Patient transported via wheelchair.O2 at 2L nasal canula. Sats above 92%.  All belongings with patient at this time. No acute distress noted.

## 2018-03-09 NOTE — Progress Notes (Signed)
[facility]  Daily Progress Note   Subjective  - 2 Days Post-Op  Patient feeling a little better.  Still not great urine output today, but IV lasix planned.  No further CP since early this am.  Objective Vitals:   03/09/18 0400 03/09/18 0500 03/09/18 0600 03/09/18 0700  BP: 121/67 (!) 124/59 121/63 118/67  Pulse: 100 93 94 (!) 104  Resp: 17 16 18 19   Temp: 98.2 F (36.8 C)     TempSrc: Oral     SpO2: 93% 92% 92% 91%  Weight:      Height:        Intake/Output Summary (Last 24 hours) at 03/09/2018 1730 Last data filed at 03/09/2018 0600 Gross per 24 hour  Intake 160 ml  Output 1100 ml  Net -940 ml    PULM  CTAB CV  RRR VASC  Neck stable, mild to moderate bruising, mild swelling  Laboratory CBC    Component Value Date/Time   WBC 9.1 03/08/2018 0536   HGB 10.0 (L) 03/08/2018 0536   HGB 10.9 (L) 02/05/2015 0848   HCT 30.3 (L) 03/08/2018 0536   HCT 34.0 (L) 02/05/2015 0848   PLT 206 03/08/2018 0536   PLT 189 02/05/2015 0848    BMET    Component Value Date/Time   NA 137 03/08/2018 0536   NA 139 02/05/2015 0848   K 4.5 03/08/2018 0536   K 4.2 02/05/2015 0848   CL 107 03/08/2018 0536   CL 101 02/05/2015 0848   CO2 24 03/08/2018 0536   CO2 31 02/05/2015 0848   GLUCOSE 116 (H) 03/08/2018 0536   GLUCOSE 92 02/05/2015 0848   BUN 26 (H) 03/08/2018 0536   BUN 23 (H) 02/05/2015 0848   CREATININE 1.23 (H) 03/08/2018 0536   CREATININE 1.05 (H) 02/05/2015 0848   CALCIUM 8.1 (L) 03/08/2018 0536   CALCIUM 9.1 02/05/2015 0848   GFRNONAA 39 (L) 03/08/2018 0536   GFRNONAA 49 (L) 02/05/2015 0848   GFRAA 45 (L) 03/08/2018 0536   GFRAA 57 (L) 02/05/2015 0848    Assessment/Planning: POD #2 s/p right CEA   Appreciate IM input  I have discussed the case with Dr. Eliane DecreeS. Patel and he has graciously agreed to transfer the patient to his service for further care.    No other vascular recs at this point and if she needs anticoagulation for anything  cardiac, should be OK now that we are 48 hours after her surgery.  Would consider no boluses if anticoagulation is given      Festus BarrenJason Dew  03/09/2018, 5:30 PM

## 2018-03-09 NOTE — Progress Notes (Signed)
Chaplain prayed with patient and offered emotional support.

## 2018-03-09 NOTE — Plan of Care (Signed)
Pt healing well from surgery.  Incision C/D/I w/o any drainage.  Pt denies pain.  Participating in care.  Not back to baseline respiratory status.  Being managed now with increased O2 [place] and IV lasix.

## 2018-03-09 NOTE — H&P (Deleted)
Pt w/fine bilat wheeze, fine crackles and diminished lung sounds.  RT contacted for med neb tx which was administered.  Pt cont to have SaO2 in the upper 80s.  Repositioned pt again and SaO2=95% on 4L.  Cont to monitor.  

## 2018-03-10 LAB — BASIC METABOLIC PANEL
ANION GAP: 7 (ref 5–15)
BUN: 34 mg/dL — ABNORMAL HIGH (ref 6–20)
CHLORIDE: 101 mmol/L (ref 101–111)
CO2: 26 mmol/L (ref 22–32)
CREATININE: 1.56 mg/dL — AB (ref 0.44–1.00)
Calcium: 8.9 mg/dL (ref 8.9–10.3)
GFR calc non Af Amer: 29 mL/min — ABNORMAL LOW (ref >60)
GFR, EST AFRICAN AMERICAN: 34 mL/min — AB (ref >60)
Glucose, Bld: 128 mg/dL — ABNORMAL HIGH (ref 65–99)
POTASSIUM: 4.9 mmol/L (ref 3.5–5.1)
SODIUM: 134 mmol/L — AB (ref 135–145)

## 2018-03-10 LAB — CBC
HCT: 28.4 % — ABNORMAL LOW (ref 35.0–47.0)
HEMOGLOBIN: 9.5 g/dL — AB (ref 12.0–16.0)
MCH: 29.2 pg (ref 26.0–34.0)
MCHC: 33.5 g/dL (ref 32.0–36.0)
MCV: 87.3 fL (ref 80.0–100.0)
Platelets: 182 10*3/uL (ref 150–440)
RBC: 3.26 MIL/uL — AB (ref 3.80–5.20)
RDW: 15.1 % — ABNORMAL HIGH (ref 11.5–14.5)
WBC: 9.5 10*3/uL (ref 3.6–11.0)

## 2018-03-10 LAB — TROPONIN I: Troponin I: 0.14 ng/mL (ref <0.03)

## 2018-03-10 MED ORDER — NITROGLYCERIN 0.4 MG SL SUBL: 0.4000 mg | SUBLINGUAL_TABLET | SUBLINGUAL | Status: DC | PRN

## 2018-03-10 MED ORDER — SODIUM CHLORIDE 0.9% FLUSH
3.0000 mL | Freq: Two times a day (BID) | INTRAVENOUS | Status: DC
  Administered 2018-03-10: 3 mL via INTRAVENOUS

## 2018-03-10 NOTE — Progress Notes (Signed)
[facility] - [place] at [facility]   PATIENT NAME: Rose [name]    MR#:  [id number]  DATE OF BIRTH:  12-08-1929  SUBJECTIVE:   Patient feels a lot better. She worked with physical therapy walked around and sitting in the chair. Denies any chest pain today. Breathing better. REVIEW OF SYSTEMS:   Review of Systems  Constitutional: Negative for chills, fever and weight loss.  HENT: Negative for ear discharge, ear pain and nosebleeds.   Eyes: Negative for blurred vision, pain and discharge.  Respiratory: Positive for shortness of breath. Negative for sputum production, wheezing and stridor.   Cardiovascular: Negative for chest pain, palpitations, orthopnea and PND.  Gastrointestinal: Negative for abdominal pain, diarrhea, nausea and vomiting.  Genitourinary: Negative for frequency and urgency.  Musculoskeletal: Negative for back pain and joint pain.  Neurological: Positive for weakness. Negative for sensory change, speech change and focal weakness.  Psychiatric/Behavioral: Negative for depression and hallucinations. The patient is not nervous/anxious.    Tolerating Diet:yes Tolerating PT: HHPT  DRUG ALLERGIES:   Allergies  Allergen Reactions  . Celecoxib Other (See Comments)    Unknown.  Marland Kitchen Avapro [Irbesartan] Other (See Comments)    Unknown  . Lisinopril Other (See Comments)    Unknown  . Plavix [Clopidogrel Bisulfate] Other (See Comments)    Unknown  . Statins Other (See Comments)    Unknown  . Ultram [Tramadol Hcl] Other (See Comments)    Unknown    VITALS:  Blood pressure 120/81, pulse 90, temperature 98.3 F (36.8 C), temperature source Oral, resp. rate 19, height  (1.626 m), weight 84.8 kg (186 lb 14.4 oz), SpO2 96 %.  PHYSICAL EXAMINATION:   Physical Exam  GENERAL:  84 y.o.-year-old patient lying in the bed with no acute distress.  EYES: Pupils equal, round, reactive to light and accommodation. No scleral icterus. Extraocular  muscles intact.  HEENT: Head atraumatic, normocephalic. Oropharynx and nasopharynx clear.  NECK:  Supple, no jugular venous distention. No thyroid enlargement, no tenderness. Surgical scar on the right looks ok LUNGS: Normal breath sounds bilaterally, no wheezing, rales, rhonchi. No use of accessory muscles of respiration.  CARDIOVASCULAR: S1, S2 normal. No murmurs, rubs, or gallops.  ABDOMEN: Soft, nontender, nondistended. Bowel sounds present. No organomegaly or mass.  EXTREMITIES: No cyanosis, clubbing or edema b/l.    NEUROLOGIC: Cranial nerves II through XII are intact. No focal Motor or sensory deficits b/l.   PSYCHIATRIC:  patient is alert and oriented x 3.  SKIN: No obvious rash, lesion, or ulcer.   LABORATORY PANEL:  CBC Recent Labs  Lab 03/10/18 0330  WBC 9.5  HGB 9.5*  HCT 28.4*  PLT 182    Chemistries  Recent Labs  Lab 03/10/18 0330  NA 134*  K 4.9  CL 101  CO2 26  GLUCOSE 128*  BUN 34*  CREATININE 1.56*  CALCIUM 8.9   Cardiac Enzymes Recent Labs  Lab 03/10/18 0330  TROPONINI 0.14*   RADIOLOGY:  Dg Chest Port 1 View  Result Date: 03/09/2018 CLINICAL DATA:  Chest pain. EXAM: PORTABLE CHEST 1 VIEW COMPARISON:  Radiograph of February 18, 2018. FINDINGS: Stable cardiomegaly. Atherosclerosis of thoracic aorta is noted. No pneumothorax is noted. Increased bibasilar opacities are noted most consistent with edema with mild associated pleural effusions. Bony thorax is unremarkable. IMPRESSION: Increased bibasilar edema is noted with mild associated pleural effusions suggesting congestive heart failure. Aortic Atherosclerosis (ICD10-I70.0). Electronically Signed   By: Lupita Raider, M.D.  On: 03/09/2018 09:31   ASSESSMENT AND PLAN:  Rose [name]  is a 84 y.o. female with a known history of diastolic CHF, GERD, hyperlipidemia, hypertension, sleep apnea and a recent CVA Who underwent right carotid endarterectomy.  Patient since yesterday has developed cough and  shortness of breath.  Earlier she was also complaining of chest pain.  She had a chest x-ray which showed congestive heart failure.  1. Acute on chronic diastolic CHF -patient received IV Lasix. She has good urine output about 2.6 L  -breathing much comfortably. Will try to wean her oxygen now. Use as needed. She uses chronically oxygen at bedtime.  -Recent echo was done no need to repeat-- EF around 75%. She was seen by cardiology prior to her endarterectomy surgery. -creat 1.5 ---pt feels better. Hold further IV lasix  2.  Chest pain appears demand ischemia in the setting of congestive heart failure. Patient is chest pain free. EKG does not show any acute ST elevation of depression -continue cardiac meds  -PRN Nitro if needed   3. History of asthma - continue inhalers  -add PRN nebs  4.  Hyperlipidemia continue Lipitor  5.  Coronary artery disease - continue Imdur and metoprolol and Brilinta  6.  Cough we will give her antitussive medications placed on some Mucinex  7.  CODE STATUS full code   D/w  patient, two daughters and son-in-law in the room. Patient will discharge home tomorrow with home health PT which will resume. Family is okay with the plan. Will keep her   Case discussed with Care Management/Social Worker. Management plans discussed with the patient, family and they are in agreement.  CODE STATUS: full  DVT Prophylaxis: TEDS  TOTAL TIME TAKING CARE OF THIS PATIENT: 30 minutes.  >50% time spent on counselling and coordination of care  POSSIBLE D/C IN 1 DAYS, DEPENDING ON CLINICAL CONDITION.  Note: This dictation was prepared with Dragon dictation along with smaller phrase technology. Any transcriptional errors that result from this process are unintentional.  Enedina Finner M.D on 03/10/2018 at 10:02 AM  Between 7am to 6pm - Pager - [phone]  After 6pm go to [email or web address] - Social research officer, government  [facility]  Office   [phone]  CC: Primary care physician; Pennie Banter, MDPatient ID: Rose [name], female   DOB: Jul 30, 1930, 84 y.o.   MRN: [id number]

## 2018-03-10 NOTE — Evaluation (Signed)
Physical Therapy Evaluation Patient Details Name: Rose [name] MRN: [id number] DOB: 05/18/1930 Today's Date: 03/10/2018   History of Present Illness  Patient is an 84 year old female admitted from home s/p transesophageal echocardiography.  She has a recent hx of TIA which affected her R side.  PMH includes Lx spine pain, nephrolithiasis, OA, Htn, HLD, heart murmur, GERD, CHF, carpal tunnel syndrome, arrhthmia and anemia.  Clinical Impression  Patient is an 84 year old female who lives in a one story home with her son.  She is independent with ADL and ambulates with a 4WW at baseline. Pt has been receiving HH PT s/p CVA affecting her R side for 2-3 weeks.  Pt is in bed and appears lethargic upon PT arrival.  She reports no pain.  Pt is able to perform bed mobility mod I and balance at EOB with assistance from bilateral UE and feet on floor.  PT provided VC's for management of RW during STS and pt was able to complete with supervision.  She ambulated 20 ft in room with supervision and was able to navigate obstacles and turn to back to chair with slowed gait but without difficulty.  Pt appears slightly unsteady on feet and presents with household ambulator gait speed.  She presents with slightly decreased strength of R UE and LE compared to L side.  Pt will continue to benefit from skilled PT with focus on strength, balance, safe use of RW and functional mobility.    Follow Up Recommendations Home health PT    Equipment Recommendations  None recommended by PT    Recommendations for Other Services       Precautions / Restrictions Precautions Precautions: Fall Restrictions Weight Bearing Restrictions: No      Mobility  Bed Mobility Overal bed mobility: Modified Independent             General bed mobility comments: Increased time and use of bedrail.  Transfers Overall transfer level: Needs assistance Equipment used: Rolling walker (2 wheeled) Transfers: Sit to/from Stand Sit to  Stand: Supervision         General transfer comment: Pt able to initiate and complete full transfer but requires frequent reminder for hand placement regarding use of RW.  Able to stand for 30-60 sec without report of syncope before initiation of ambulation.  Ambulation/Gait Ambulation/Gait assistance: Supervision Ambulation Distance (Feet): 20 Feet Assistive device: Rolling walker (2 wheeled)     Gait velocity interpretation: <1.31 ft/sec, indicative of household ambulator General Gait Details: Low foot clearance, slow to navigate around obstacles but no VC's required to clear, min VC's for keeping RW close in proximity to body for safety.  Stairs            Wheelchair Mobility    Modified Rankin (Stroke Patients Only)       Balance Overall balance assessment: Needs assistance Sitting-balance support: No upper extremity supported;Feet supported Sitting balance-Leahy Scale: Good     Standing balance support: Bilateral upper extremity supported Standing balance-Leahy Scale: Fair   Single Leg Stance - Right Leg: 0 Single Leg Stance - Left Leg: 0                         Pertinent Vitals/Pain      Home Living Family/patient expects to be discharged to:: Private residence Living Arrangements: Children Available Help at Discharge: Family;Available 24 hours/day Type of Home: House Home Access: Stairs to enter Entrance Stairs-Rails: None Entrance Stairs-Number of  Steps: 3 Home Layout: One level Home Equipment: Walker - 4 wheels      Prior Function Level of Independence: Independent with assistive device(s)         Comments: Household ambulates with RW.  Recent stroke affected R side strength.  Still wearing home O2 nocturnally only.     Hand Dominance   Dominant Hand: Right    Extremity/Trunk Assessment   Upper Extremity Assessment Upper Extremity Assessment: Generalized weakness(L: Grossly 4-/5, R: grossly 3+/5)    Lower Extremity  Assessment Lower Extremity Assessment: Generalized weakness(L: grossly 4/5, R: grossly 4-/5) RLE Sensation: (Pt reports no sensory deficits in bilateral feet.)    Cervical / Trunk Assessment Cervical / Trunk Assessment: Kyphotic  Communication      Cognition Arousal/Alertness: Lethargic Behavior During Therapy: WFL for tasks assessed/performed Overall Cognitive Status: Within Functional Limits for tasks assessed                                 General Comments: Patient oriented to self and situation.  Lethargic and response to questions is delayed.      General Comments      Exercises     Assessment/Plan    PT Assessment Patient needs continued PT services  PT Problem List Decreased strength;Decreased activity tolerance;Decreased balance;Decreased mobility;Decreased knowledge of use of DME;Decreased safety awareness;Decreased knowledge of precautions;Cardiopulmonary status limiting activity       PT Treatment Interventions DME instruction;Gait training;Functional mobility training;Therapeutic activities;Patient/family education;Therapeutic exercise;Balance training;Neuromuscular re-education;Cognitive remediation    PT Goals (Current goals can be found in the Care Plan section)  Acute Rehab PT Goals Patient Stated Goal: to go home and to regain general strength. PT Goal Formulation: With patient/family Time For Goal Achievement: 03/19/18 Potential to Achieve Goals: Good    Frequency Min 2X/week   Barriers to discharge        Co-evaluation               AM-PAC PT "6 Clicks" Daily Activity  Outcome Measure Difficulty turning over in bed (including adjusting bedclothes, sheets and blankets)?: A Little Difficulty moving from lying on back to sitting on the side of the bed? : A Little Difficulty sitting down on and standing up from a chair with arms (e.g., wheelchair, bedside commode, etc,.)?: A Little Help needed moving to and from a bed to chair  (including a wheelchair)?: A Little Help needed walking in hospital room?: A Little Help needed climbing 3-5 steps with a railing? : A Little 6 Click Score: 18    End of Session Equipment Utilized During Treatment: Gait belt Activity Tolerance: Patient tolerated treatment well Patient left: with call bell/phone within reach;with family/visitor present;in chair;with chair alarm set(Physician in room) Nurse Communication: Mobility status PT Visit Diagnosis: Difficulty in walking, not elsewhere classified (R26.2);Muscle weakness (generalized) (M62.81);Hemiplegia and hemiparesis Hemiplegia - Right/Left: Right Hemiplegia - dominant/non-dominant: Dominant Hemiplegia - caused by: Other cerebrovascular disease    Time: 1000-1030 PT Time Calculation (min) (ACUTE ONLY): 30 min   Charges:   PT Evaluation $PT Eval Low Complexity: 1 Low PT Treatments $Therapeutic Activity: 8-22 mins   PT G Codes:   PT G-Codes **NOT FOR INPATIENT CLASS** Functional Assessment Tool Used: AM-PAC 6 Clicks Basic Mobility    Glenetta Hew, PT, DPT   Glenetta Hew 03/10/2018, 10:38 AM

## 2018-03-10 NOTE — Plan of Care (Signed)
Patient has not had a bowel movement since Wednesday. Positive bowel sounds. Administered prune juice and stool softener. Will continue to monitor.

## 2018-03-11 NOTE — Care Management Note (Signed)
Case Management Note  Patient Details  Name: Rose [name] MRN: [id number] Date of Birth: [birth year]  Subjective/Objective:  Spoke with family. [facility] with [facility] for PT, OT, HHA and RN. Jermaine notified of discharge. Attending to put resumption of care orders in. Patient will discharge home by car. Answered all of familys questions.                  Action/Plan: Res[facility] of care with [facility]  Expected Discharge Date:  03/11/18               Expected Discharge Plan:  Home w Home Health Services  In-House Referral:     Discharge planning Services  CM Consult  Post Acute Care Choice:  Home Health, Resumption of Svcs/PTA Provider Choice offered to:  Patient, Adult Children  DME Arranged:    DME Agency:     HH Arranged:  RN, PT, OT, Nurse's Aide HH Agency:  [facility]  Status of Service:  Completed, signed off  If discussed at Long Length of Stay Meetings, dates discussed:    Additional Comments:  Marily Memos, RN 03/11/2018, 8:59 AM

## 2018-03-11 NOTE — Progress Notes (Signed)
Pt to be discharged today. Iv's and tele removed. disch instructions (no prescrips) given to pt's daughter. disch via w.c. Accompanied by family

## 2018-03-11 NOTE — Discharge Summary (Signed)
[facility] [place] [facility]   PATIENT NAME: Rose [name]    MR#:  [id number]  DATE OF BIRTH:  1930-04-01  DATE OF ADMISSION:  03/07/2018 ADMITTING PHYSICIAN: Annice Needy, MD  DATE OF DISCHARGE: 03/11/2018  PRIMARY CARE PHYSICIAN: Pennie Banter, MD    ADMISSION DIAGNOSIS:  CAROTID ARTERY STENOSIS  DISCHARGE DIAGNOSIS:  Acute on chronic diastolic heart failure--- improved S/P right carotid endarterectomy by Dr Wyn Quaker  SECONDARY DIAGNOSIS:   Past Medical History:  Diagnosis Date  . Allergic rhinitis   . Anemia   . Arrhythmia    s/p ablation for A-fib  . Carpal tunnel syndrome   . CHF (congestive heart failure) (HCC)   . GERD (gastroesophageal reflux disease)   . Heart murmur   . Hyperlipidemia   . Hypertension   . Lumbar spine pain   . Nephrolithiasis   . Osteoarthritis   . Pre-diabetes   . Sleep apnea   . TIA (transient ischemic attack)     HOSPITAL COURSE:  MaggieNewmanis a84 y.o.femalewith a known history of diastolic CHF, GERD, hyperlipidemia, hypertension, sleep apnea and a recent CVA Who underwent right carotid endarterectomy.Patient since yesterday has developed cough and shortness of breath. Earlier she was also complaining of chest pain. She had a chest x-ray which showed congestive heart failure.  1.Acute on chronic diastolic CHF -patient received IV Lasix. She has good urine output about 3 liters  -breathing much comfortably. sats 94% on RA, Use oxygen  as needed. She uses chronically oxygen at bedtime.  -Recent echo was done no need to repeat-- EF around 75%. She was seen by cardiology prior to her endarterectomy surgery. -creat 1.5 ---pt feels better. Hold further IV lasix -cont ASA ans Brilinta   2.Chest pain appears demand ischemia in the setting of congestive heart failure. Patient is chest pain free. EKG does not show any acute ST elevation of depression -continue cardiac meds  -PRN Nitro if needed    3.History of asthma - continue inhalers  -add PRN nebs  4.Hyperlipidemia continue Lipitor  5.Coronary artery disease - continue Imdur and metoprolol  6.Cough we will give her antitussive medications placed on some Mucinex  7.CODE STATUS full code  f/u PCP and Dr Wyn Quaker [facility] services to be resumed. D/w 2 dters in the room  CONSULTS OBTAINED:  Treatment Team:  Ihor Austin, MD  DRUG ALLERGIES:   Allergies  Allergen Reactions  . Celecoxib Other (See Comments)    Unknown.  Marland Kitchen Avapro [Irbesartan] Other (See Comments)    Unknown  . Lisinopril Other (See Comments)    Unknown  . Plavix [Clopidogrel Bisulfate] Other (See Comments)    Unknown  . Statins Other (See Comments)    Unknown  . Ultram [Tramadol Hcl] Other (See Comments)    Unknown    DISCHARGE MEDICATIONS:   Allergies as of 03/11/2018      Reactions   Celecoxib Other (See Comments)   Unknown.   Avapro [irbesartan] Other (See Comments)   Unknown   Lisinopril Other (See Comments)   Unknown   Plavix [clopidogrel Bisulfate] Other (See Comments)   Unknown   Statins Other (See Comments)   Unknown   Ultram [tramadol Hcl] Other (See Comments)   Unknown      Medication List    TAKE these medications   acetaminophen 500 MG tablet Commonly known as:  TYLENOL Take 500-1,000 mg by mouth every 6 (six) hours as needed (pain).   albuterol 108 (90 Base) MCG/ACT  inhaler Commonly known as:  PROVENTIL HFA;VENTOLIN HFA Inhale 2 puffs into the lungs every 6 (six) hours as needed for wheezing or shortness of breath.   aspirin EC 81 MG tablet Take 81 mg by mouth 3 (three) times a week.   atorvastatin 20 MG tablet Commonly known as:  LIPITOR Take 1 tablet (20 mg total) by mouth at bedtime.   budesonide-formoterol 80-4.5 MCG/ACT inhaler Commonly known as:  SYMBICORT Inhale 2 puffs into the lungs 2 (two) times daily.   Calcium-Magnesium-Zinc 333-133-5 MG Tabs Take 1 tablet by mouth once a week.    cholecalciferol 1000 units tablet Commonly known as:  VITAMIN D Take 1,000 Units by mouth daily.   docusate sodium 100 MG capsule Commonly known as:  COLACE Take 100 mg by mouth 2 (two) times daily.   isosorbide mononitrate 30 MG 24 hr tablet Commonly known as:  IMDUR Take 30 mg by mouth daily at 12 noon.   losartan 25 MG tablet Commonly known as:  COZAAR Take 25 mg by mouth every other day.   Melatonin 5 MG Caps Take 10 mg by mouth at bedtime.   metoprolol succinate 25 MG 24 hr tablet Commonly known as:  TOPROL-XL Take 25 mg by mouth at bedtime.   multivitamin tablet Take 1 tablet by mouth daily.   ranitidine 150 MG tablet Commonly known as:  ZANTAC TAKE ONE TABLET BY MOUTH TWICE DAILY HEARTBURN   ranolazine 500 MG 12 hr tablet Commonly known as:  RANEXA Take 500 mg by mouth 2 (two) times daily.   ticagrelor 90 MG Tabs tablet Commonly known as:  BRILINTA Take 1 tablet (90 mg total) by mouth 2 (two) times daily.   vitamin B-12 1000 MCG tablet Commonly known as:  CYANOCOBALAMIN Take 1,000 mcg by mouth daily.       If you experience worsening of your admission symptoms, develop shortness of breath, life threatening emergency, suicidal or homicidal thoughts you must seek medical attention immediately by calling 911 or calling your MD immediately  if symptoms less severe.  You Must read complete instructions/literature along with all the possible adverse reactions/side effects for all the Medicines you take and that have been prescribed to you. Take any new Medicines after you have completely understood and accept all the possible adverse reactions/side effects.   Please note  You were cared for by a hospitalist during your hospital stay. If you have any questions about your discharge medications or the care you received while you were in the hospital after you are discharged, you can call the unit and asked to speak with the hospitalist on call if the hospitalist  that took care of you is not available. Once you are discharged, your primary care physician will handle any further medical issues. Please note that NO REFILLS for any discharge medications will be authorized once you are discharged, as it is imperative that you return to your primary care physician (or establish a relationship with a primary care physician if you do not have one) for your aftercare needs so that they can reassess your need for medications and monitor your lab values. Today   SUBJECTIVE   Doing well. Wants to go home. Family in the room  VITAL SIGNS:  Blood pressure 114/71, pulse 87, temperature 98.8 F (37.1 C), temperature source Oral, resp. rate 18, height  (1.626 m), weight 85.1 kg (187 lb 9.6 oz), SpO2 94 %.  I/O:    Intake/Output Summary (Last 24 hours) at 03/11/2018  8119 Last data filed at 03/11/2018 0200 Gross per 24 hour  Intake 840 ml  Output 850 ml  Net -10 ml    PHYSICAL EXAMINATION:  GENERAL:  84 y.o.-year-old patient lying in the bed with no acute distress.  EYES: Pupils equal, round, reactive to light and accommodation. No scleral icterus. Extraocular muscles intact.  HEENT: Head atraumatic, normocephalic. Oropharynx and nasopharynx clear.  NECK:  Supple, no jugular venous distention. No thyroid enlargement, no tenderness. Right neck surgical scar+ LUNGS: Normal breath sounds bilaterally, no wheezing, rales,rhonchi or crepitation. No use of accessory muscles of respiration.  CARDIOVASCULAR: S1, S2 normal. No murmurs, rubs, or gallops.  ABDOMEN: Soft, non-tender, non-distended. Bowel sounds present. No organomegaly or mass.  EXTREMITIES: No pedal edema, cyanosis, or clubbing.  NEUROLOGIC: Cranial nerves II through XII are intact. Muscle strength 5/5 in all extremities. Sensation intact. Gait not checked.  PSYCHIATRIC: The patient is alert and oriented x 3.  SKIN: No obvious rash, lesion, or ulcer.   DATA REVIEW:   CBC  Recent Labs  Lab  03/10/18 0330  WBC 9.5  HGB 9.5*  HCT 28.4*  PLT 182    Chemistries  Recent Labs  Lab 03/10/18 0330  NA 134*  K 4.9  CL 101  CO2 26  GLUCOSE 128*  BUN 34*  CREATININE 1.56*  CALCIUM 8.9    Microbiology Results   Recent Results (from the past 240 hour(s))  MRSA PCR Screening     Status: None   Collection Time: 03/07/18  5:37 PM  Result Value Ref Range Status   MRSA by PCR NEGATIVE NEGATIVE Final    Comment:        The GeneXpert MRSA Assay (FDA approved for NASAL specimens only), is one component of a comprehensive MRSA colonization surveillance program. It is not intended to diagnose MRSA infection nor to guide or monitor treatment for MRSA infections. Performed at [facility], [address] [place] [place], [place] [address]     RADIOLOGY:  Dg Chest Port 1 View  Result Date: 03/09/2018 CLINICAL DATA:  Chest pain. EXAM: PORTABLE CHEST 1 VIEW COMPARISON:  Radiograph of February 18, 2018. FINDINGS: Stable cardiomegaly. Atherosclerosis of thoracic aorta is noted. No pneumothorax is noted. Increased bibasilar opacities are noted most consistent with edema with mild associated pleural effusions. Bony thorax is unremarkable. IMPRESSION: Increased bibasilar edema is noted with mild associated pleural effusions suggesting congestive heart failure. Aortic Atherosclerosis (ICD10-I70.0). Electronically Signed   By: Lupita Raider, M.D.   On: 03/09/2018 09:31     Management plans discussed with the patient, family and they are in agreement.  CODE STATUS:     Code Status Orders  (From admission, onward)        Start     Ordered   03/07/18 1606  Full code  Continuous     03/07/18 1606    Code Status History    Date Active Date Inactive Code Status Order ID Comments User Context   02/18/2018 0111 02/20/2018 2107 Full Code [id number]  Oralia Manis, MD Inpatient   06/20/2016 1421 06/20/2016 1722 Full Code [id number]  Auburn Bilberry, MD ED   10/13/2015 1855 10/15/2015 1514  Full Code [id number]  Ramonita Lab, MD Inpatient    Advance Directive Documentation     Most Recent Value  Type of Advance Directive  Healthcare Power of Attorney, Living will  Pre-existing out of facility DNR order (yellow form or pink MOST form)  -  "MOST" Form in Place?  -  TOTAL TIME TAKING CARE OF THIS PATIENT: *40* minutes.    Enedina Finner M.D on 03/11/2018 at 8:57 AM  Between 7am to 6pm - Pager - [phone] After 6pm go to [email or web address] - password [email or web address]  [facility]  Office  [phone]  CC: Primary care physician; Pennie Banter, MD

## 2018-03-23 ENCOUNTER — Ambulatory Visit (INDEPENDENT_AMBULATORY_CARE_PROVIDER_SITE_OTHER): Payer: Medicare Other | Admitting: Vascular Surgery

## 2018-03-23 ENCOUNTER — Encounter (INDEPENDENT_AMBULATORY_CARE_PROVIDER_SITE_OTHER): Payer: Self-pay | Admitting: Vascular Surgery

## 2018-03-23 VITALS — BP 122/71 | HR 85 | Resp 16 | Ht 64.0 in | Wt 182.4 lb

## 2018-03-23 DIAGNOSIS — I10 Essential (primary) hypertension: Secondary | ICD-10-CM

## 2018-03-23 DIAGNOSIS — I63239 Cerebral infarction due to unspecified occlusion or stenosis of unspecified carotid arteries: Secondary | ICD-10-CM

## 2018-03-23 NOTE — Assessment & Plan Note (Signed)
blood pressure control important in reducing the progression of atherosclerotic disease. On appropriate oral medications.  

## 2018-03-23 NOTE — Progress Notes (Signed)
Patient ID: Rose [name], female   DOB: 1930/02/14, 84 y.o.   MRN: [id number]  Chief Complaint  Patient presents with  . Routine Post Op    right cea    HPI Rose [name] is a 84 y.o. female.  Patient returns in follow-up after right carotid endarterectomy a little over 2 weeks ago.  She is doing reasonably well.  She has good days and bad days.  She had no major perioperative complications but given her age and her previous stroke, her recovery has been understandably slower.   Past Medical History:  Diagnosis Date  . Allergic rhinitis   . Anemia   . Arrhythmia    s/p ablation for A-fib  . Carpal tunnel syndrome   . CHF (congestive heart failure) (HCC)   . GERD (gastroesophageal reflux disease)   . Heart murmur   . Hyperlipidemia   . Hypertension   . Lumbar spine pain   . Nephrolithiasis   . Osteoarthritis   . Pre-diabetes   . Sleep apnea   . TIA (transient ischemic attack)     Past Surgical History:  Procedure Laterality Date  . ABDOMINAL HYSTERECTOMY    . ablation  January 2011   A-fib  . BACK SURGERY    . CARDIAC CATHETERIZATION    . ENDARTERECTOMY Right 03/07/2018   Procedure: ENDARTERECTOMY CAROTID;  Surgeon: Annice Needy, MD;  Location: [facility] ORS;  Service: Vascular;  Laterality: Right;  . ESOPHAGOGASTRODUODENOSCOPY N/A 05/22/2015   Procedure: ESOPHAGOGASTRODUODENOSCOPY (EGD);  Surgeon: Scot Jun, MD;  Location: [facility] ENDOSCOPY;  Service: Endoscopy;  Laterality: N/A;  . RENAL ARTERY STENT    . REPLACEMENT TOTAL KNEE BILATERAL    . SAVORY DILATION N/A 05/22/2015   Procedure: SAVORY DILATION;  Surgeon: Scot Jun, MD;  Location: [facility] ENDOSCOPY;  Service: Endoscopy;  Laterality: N/A;  . TEE WITHOUT CARDIOVERSION N/A 05/26/2017   Procedure: TRANSESOPHAGEAL ECHOCARDIOGRAM (TEE);  Surgeon: Alwyn Pea, MD;  Location: [facility] ORS;  Service: Cardiovascular;  Laterality: N/A;  . TEE WITHOUT CARDIOVERSION N/A 02/20/2018   Procedure: TRANSESOPHAGEAL  ECHOCARDIOGRAM (TEE);  Surgeon: Dalia Heading, MD;  Location: [facility] ORS;  Service: Cardiovascular;  Laterality: N/A;  . TONSILLECTOMY        Allergies  Allergen Reactions  . Celecoxib Other (See Comments)    Unknown.  Marland Kitchen Avapro [Irbesartan] Other (See Comments)    Unknown  . Lisinopril Other (See Comments)    Unknown  . Plavix [Clopidogrel Bisulfate] Other (See Comments)    Unknown  . Statins Other (See Comments)    Unknown  . Ultram [Tramadol Hcl] Other (See Comments)    Unknown    Current Outpatient Medications  Medication Sig Dispense Refill  . acetaminophen (TYLENOL) 500 MG tablet Take 500-1,000 mg by mouth every 6 (six) hours as needed (pain).    Marland Kitchen albuterol (PROVENTIL HFA;VENTOLIN HFA) 108 (90 BASE) MCG/ACT inhaler Inhale 2 puffs into the lungs every 6 (six) hours as needed for wheezing or shortness of breath.    Marland Kitchen aspirin EC 81 MG tablet Take 81 mg by mouth 3 (three) times a week.     Marland Kitchen atorvastatin (LIPITOR) 20 MG tablet Take 1 tablet (20 mg total) by mouth at bedtime. 30 tablet 2  . budesonide-formoterol (SYMBICORT) 80-4.5 MCG/ACT inhaler Inhale 2 puffs into the lungs 2 (two) times daily.    . Calcium-Magnesium-Zinc 333-133-5 MG TABS Take 1 tablet by mouth once a week.    . cholecalciferol (VITAMIN D)  1000 units tablet Take 1,000 Units by mouth daily.    Marland Kitchen docusate sodium (COLACE) 100 MG capsule Take 100 mg by mouth 2 (two) times daily.    Marland Kitchen losartan (COZAAR) 25 MG tablet Take 25 mg by mouth every other day.     . Melatonin 5 MG CAPS Take 10 mg by mouth at bedtime.     . metoprolol succinate (TOPROL-XL) 25 MG 24 hr tablet Take 25 mg by mouth at bedtime.     . Multiple Vitamin (MULTIVITAMIN) tablet Take 1 tablet by mouth daily.    . ranitidine (ZANTAC) 150 MG tablet TAKE ONE TABLET BY MOUTH TWICE DAILY HEARTBURN  5  . ranolazine (RANEXA) 500 MG 12 hr tablet Take 500 mg by mouth 2 (two) times daily.    . ticagrelor (BRILINTA) 90 MG TABS tablet Take 1 tablet (90 mg total)  by mouth 2 (two) times daily. 60 tablet 2  . vitamin B-12 (CYANOCOBALAMIN) 1000 MCG tablet Take 1,000 mcg by mouth daily.    . isosorbide mononitrate (IMDUR) 30 MG 24 hr tablet Take 30 mg by mouth daily at 12 noon.      No current facility-administered medications for this visit.         Physical Exam BP 122/71 (BP Location: Left Arm)   Pulse 85   Resp 16   Ht  (1.626 m)   Wt 182 lb 6.4 oz (82.7 kg)   BMI 31.31 kg/m  Gen:  WD/WN, NAD Skin: incision C/D/I     Assessment/Plan:  No problem-specific Assessment & Plan notes found for this encounter.      Festus Barren 03/23/2018, 3:34 PM   This note was created with Dragon medical transcription system.  Any errors from dictation are unintentional.

## 2018-03-23 NOTE — Assessment & Plan Note (Signed)
Doing well status post right carotid endarterectomy 2 weeks ago.  No major perioperative complications.  May resume all normal activities as tolerated.  Return to clinic in 3 months with carotid duplex

## 2018-06-29 ENCOUNTER — Ambulatory Visit (INDEPENDENT_AMBULATORY_CARE_PROVIDER_SITE_OTHER): Payer: Medicare Other | Admitting: Nurse Practitioner

## 2018-06-29 ENCOUNTER — Encounter (INDEPENDENT_AMBULATORY_CARE_PROVIDER_SITE_OTHER): Payer: Self-pay | Admitting: Vascular Surgery

## 2018-06-29 ENCOUNTER — Ambulatory Visit (INDEPENDENT_AMBULATORY_CARE_PROVIDER_SITE_OTHER): Payer: Medicare Other

## 2018-06-29 VITALS — BP 130/77 | HR 75 | Resp 16 | Ht 64.0 in | Wt 184.8 lb

## 2018-06-29 DIAGNOSIS — E785 Hyperlipidemia, unspecified: Secondary | ICD-10-CM

## 2018-06-29 DIAGNOSIS — I63239 Cerebral infarction due to unspecified occlusion or stenosis of unspecified carotid arteries: Secondary | ICD-10-CM | POA: Diagnosis not present

## 2018-06-29 DIAGNOSIS — Z9889 Other specified postprocedural states: Secondary | ICD-10-CM | POA: Diagnosis not present

## 2018-06-29 DIAGNOSIS — K219 Gastro-esophageal reflux disease without esophagitis: Secondary | ICD-10-CM

## 2018-06-29 MED ORDER — GABAPENTIN 300 MG PO CAPS: 300.0000 mg | ORAL_CAPSULE | Freq: Every day | ORAL | 0 refills | Status: AC

## 2018-07-02 NOTE — Progress Notes (Signed)
Subjective:    Patient ID: Rose [name], female    DOB: 08/13/1930, 84 y.o.   MRN: [id number] Chief Complaint  Patient presents with  . Carotid    44month follow up    HPI  The patient is seen for follow up evaluation of carotid stenosis. The carotid stenosis followed by ultrasound.  Patient complains of a consistent headache radiating from her neck up into her head on her right side, following the procedure.  The patient denies amaurosis fugax. There is  recent history of TIA symptoms or focal motor deficits. There is no prior documented CVA.    The patient is taking enteric-coated aspirin 81 mg daily.  There is no history of migraine headaches. There is no history of seizures.  The patient has a history of coronary artery disease, no recent episodes of angina or shortness of breath. The patient denies PAD or claudication symptoms. There is a history of hyperlipidemia which is being treated with a statin.    Carotid Duplex done today shows a patent right endarterectomy site with velocities in the right proximal to mid internal carotid artery at the distal end of the endarterectomy site and ranges consistent with 60 to 79% stenosis.  There also appears to be significant internal vessel narrowing from 0.55 cm to 0.23 cm.  The left carotid shows velocities in the 1 to 39% stenosis range.  Both vertebrals demonstrate antegrade flow.  Previous studies.  Increased change compared to last study in 02/18/2018, where velocities in the right internal carotid artery measure 160/43.  Studies done on 12/12/2017 show velocities in the right mid internal carotid artery 107/27.  Constitutional: [] Weight loss  [] Fever  [] Chills Cardiac: [] Chest pain   [] Chest pressure   [] Palpitations   [] Shortness of breath when laying flat   [] Shortness of breath with exertion. Vascular:  [] Pain in legs with walking   [] Pain in legs with standing  [] History of DVT   [] Phlebitis   [x] Swelling in legs   [] Varicose veins    [] Non-healing ulcers Pulmonary:   [] Uses home oxygen   [] Productive cough   [] Hemoptysis   [] Wheeze  [] COPD   [] Asthma Neurologic:  [] Dizziness   [] Seizures   [] History of stroke   [x] History of TIA  [] Aphasia   [] Vissual changes  Headache [] Weakness or numbness in arm   [] Weakness or numbness in leg Musculoskeletal:   [] Joint swelling   [] Joint pain   [] Low back pain Hematologic:  [] Easy bruising  [] Easy bleeding   [] Hypercoagulable state   [] Anemic Gastrointestinal:  [] Diarrhea   [] Vomiting  [] Gastroesophageal reflux/heartburn   [] Difficulty swallowing. Genitourinary:  [] Chronic kidney disease   [] Difficult urination  [] Frequent urination   [] Blood in urine Skin:  [] Rashes   [] Ulcers  Psychological:  [] History of anxiety   []  History of major depression.     Objective:   Physical Exam  BP 130/77 (BP Location: Left Arm)   Pulse 75   Resp 16   Ht 5\' 4"  (1.626 m)   Wt 184 lb 12.8 oz (83.8 kg)   BMI 31.72 kg/m   Past Medical History:  Diagnosis Date  . Allergic rhinitis   . Anemia   . Arrhythmia    s/p ablation for A-fib  . Carpal tunnel syndrome   . CHF (congestive heart failure) (HCC)   . GERD (gastroesophageal reflux disease)   . Heart murmur   . Hyperlipidemia   . Hypertension   . Lumbar spine pain   . Nephrolithiasis   .  Osteoarthritis   . Pre-diabetes   . Sleep apnea   . TIA (transient ischemic attack)      Gen: WD/WN, NAD Head: [place]/AT, No temporalis wasting.  Ear/Nose/Throat: Hearing grossly intact, nares w/o erythema or drainage Eyes: PER, EOMI, sclera nonicteric.  Neck: Supple, no masses.  bruit.  Pulmonary:  Good air movement, clear to auscultation bilaterally, no use of accessory muscles.  Cardiac: RRR Vascular:  Well healing and well approximated wound on right neck. Vessel Right Left  Radial  palpable  palpable  Gastrointestinal: soft, non-distended. No guarding/no peritoneal signs.  Musculoskeletal: M/S 5/5 throughout.  No deformity or atrophy.    Neurologic: Pain and light touch intact in extremities.  Symmetrical.  Speech is fluent. Motor exam as listed above. Psychiatric: Judgment intact, Mood & affect appropriate for pt's clinical situation. Dermatologic: No Venous rashes no ulcers noted.  No changes consistent with cellulitis. Lymph : No Cervical lymphadenopathy, no lichenification or skin changes of chronic lymphedema.   Social History   Socioeconomic History  . Marital status: Widowed    Spouse name: Not on file  . Number of children: Not on file  . Years of education: Not on file  . Highest education level: High school graduate  Occupational History  . Not on file  Social Needs  . Financial resource strain: Not very hard  . Food insecurity:    Worry: Sometimes true    Inability: Never true  . Transportation needs:    Medical: Yes    Non-medical: Yes  Tobacco Use  . Smoking status: Never Smoker  . Smokeless tobacco: Never Used  Substance and Sexual Activity  . Alcohol use: No  . Drug use: No  . Sexual activity: Not on file  Lifestyle  . Physical activity:    Days per week: 7 days    Minutes per session: 20 min  . Stress: Not at all  Relationships  . Social connections:    Talks on phone: More than three times a week    Gets together: Once a week    Attends religious service: More than 4 times per year    Active member of club or organization: Yes    Attends meetings of clubs or organizations: 1 to 4 times per year    Relationship status: Widowed  . Intimate partner violence:    Fear of current or ex partner: Patient refused    Emotionally abused: Patient refused    Physically abused: Patient refused    Forced sexual activity: Patient refused  Other Topics Concern  . Not on file  Social History Narrative  . Not on file    Past Surgical History:  Procedure Laterality Date  . ABDOMINAL HYSTERECTOMY    . ablation  January 2011   A-fib  . BACK SURGERY    . CARDIAC CATHETERIZATION    .  ENDARTERECTOMY Right 03/07/2018   Procedure: ENDARTERECTOMY CAROTID;  Surgeon: Annice Needy, MD;  Location: [facility] ORS;  Service: Vascular;  Laterality: Right;  . ESOPHAGOGASTRODUODENOSCOPY N/A 05/22/2015   Procedure: ESOPHAGOGASTRODUODENOSCOPY (EGD);  Surgeon: Scot Jun, MD;  Location: [facility] ENDOSCOPY;  Service: Endoscopy;  Laterality: N/A;  . RENAL ARTERY STENT    . REPLACEMENT TOTAL KNEE BILATERAL    . SAVORY DILATION N/A 05/22/2015   Procedure: SAVORY DILATION;  Surgeon: Scot Jun, MD;  Location: [facility] ENDOSCOPY;  Service: Endoscopy;  Laterality: N/A;  . TEE WITHOUT CARDIOVERSION N/A 05/26/2017   Procedure: TRANSESOPHAGEAL ECHOCARDIOGRAM (TEE);  Surgeon: Dorothyann Peng  D, MD;  Location: [facility] ORS;  Service: Cardiovascular;  Laterality: N/A;  . TEE WITHOUT CARDIOVERSION N/A 02/20/2018   Procedure: TRANSESOPHAGEAL ECHOCARDIOGRAM (TEE);  Surgeon: Dalia HeadingFath, Kenneth A, MD;  Location: [facility] ORS;  Service: Cardiovascular;  Laterality: N/A;  . TONSILLECTOMY      Family History  Problem Relation Age of Onset  . Diabetes Father   . Hypertension Mother     Allergies  Allergen Reactions  . Celecoxib Other (See Comments)    Unknown.  Marland Kitchen. Avapro [Irbesartan] Other (See Comments)    Unknown  . Lisinopril Other (See Comments)    Unknown  . Plavix [Clopidogrel Bisulfate] Other (See Comments)    Unknown  . Statins Other (See Comments)    Unknown  . Ultram [Tramadol Hcl] Other (See Comments)    Unknown       Assessment & Plan:   1. Carotid stenosis, symptomatic, with infarction [facility](HCC)  Carotid Duplex done today shows a patent right endarterectomy site with velocities in the right proximal to mid internal carotid artery at the distal end of the endarterectomy site and ranges consistent with 60 to 79% stenosis.  There also appears to be significant internal vessel narrowing from 0.55 cm to 0.23 cm.  The left carotid shows velocities in the 1 to 39% stenosis range.  Both vertebrals demonstrate  antegrade flow.  Previous studies.  Increased change compared to last study in 02/18/2018, where velocities in the right internal carotid artery measure 160/43.  Studies done on 12/12/2017 show velocities in the right mid internal carotid artery 107/27.  These velocities are typically not consistent with what is seen post endarterectomy.  Also the patient's descriptions of a sharp shooting pain going from her neck to her head are unusual.  We will obtain a CT of her neck to determine if further interventions need to be done.  Patient will follow-up in the office with us after the CT is completed.  - CT Angio Neck W/Cm &/Or Wo/Cm; Future  2. Other specified postprocedural states See above - CT Angio Neck W/Cm &/Or Wo/Cm; Future  3. Gastroesophageal reflux disease, esophagitis presence not specified Continue PPI as already ordered, this medication has been reviewed and there are no changes at this time.  Avoidence of caffeine and alcohol  Moderate elevation of the head of the bed   4. Hyperlipidemia, unspecified hyperlipidemia type Continue statin as ordered and reviewed, no changes at this time      Current Outpatient Medications on File Prior to Visit  Medication Sig Dispense Refill  . acetaminophen (TYLENOL) 500 MG tablet Take 500-1,000 mg by mouth every 6 (six) hours as needed (pain).    Marland Kitchen. albuterol (PROVENTIL HFA;VENTOLIN HFA) 108 (90 BASE) MCG/ACT inhaler Inhale 2 puffs into the lungs every 6 (six) hours as needed for wheezing or shortness of breath.    Marland Kitchen. aspirin EC 81 MG tablet Take 81 mg by mouth 3 (three) times a week.     Marland Kitchen. atorvastatin (LIPITOR) 20 MG tablet Take 1 tablet (20 mg total) by mouth at bedtime. 30 tablet 2  . budesonide-formoterol (SYMBICORT) 80-4.5 MCG/ACT inhaler Inhale 2 puffs into the lungs 2 (two) times daily.    . Calcium-Magnesium-Zinc 333-133-5 MG TABS Take 1 tablet by mouth once a week.    . cholecalciferol (VITAMIN D) 1000 units tablet Take 1,000 Units by  mouth daily.    Marland Kitchen. docusate sodium (COLACE) 100 MG capsule Take 100 mg by mouth 2 (two) times daily.    .Marland Kitchen  losartan (COZAAR) 25 MG tablet Take 25 mg by mouth every other day.     . Melatonin 5 MG CAPS Take 10 mg by mouth at bedtime.     . metoprolol succinate (TOPROL-XL) 25 MG 24 hr tablet Take 25 mg by mouth at bedtime.     . Multiple Vitamin (MULTIVITAMIN) tablet Take 1 tablet by mouth daily.    . ranitidine (ZANTAC) 150 MG tablet TAKE ONE TABLET BY MOUTH TWICE DAILY HEARTBURN  5  . ranolazine (RANEXA) 500 MG 12 hr tablet Take 500 mg by mouth 2 (two) times daily.    . ticagrelor (BRILINTA) 90 MG TABS tablet Take 1 tablet (90 mg total) by mouth 2 (two) times daily. 60 tablet 2  . vitamin B-12 (CYANOCOBALAMIN) 1000 MCG tablet Take 1,000 mcg by mouth daily.    . isosorbide mononitrate (IMDUR) 30 MG 24 hr tablet Take 30 mg by mouth daily at 12 noon.      No current facility-administered medications on file prior to visit.     There are no Patient Instructions on file for this visit. No follow-ups on file.   Georgiana Spinner, NP

## 2018-07-03 ENCOUNTER — Encounter (INDEPENDENT_AMBULATORY_CARE_PROVIDER_SITE_OTHER): Payer: Self-pay | Admitting: Nurse Practitioner

## 2018-08-31 ENCOUNTER — Other Ambulatory Visit: Payer: Medicare Other

## 2018-10-15 ENCOUNTER — Emergency Department: Payer: Medicare HMO

## 2018-10-15 ENCOUNTER — Emergency Department
Admission: EM | Admit: 2018-10-15 | Discharge: 2018-10-15 | Disposition: A | Discharge status: Home / Self Care | Payer: Medicare HMO | Attending: Emergency Medicine | Admitting: Emergency Medicine

## 2018-10-15 ENCOUNTER — Encounter: Payer: Self-pay | Admitting: Emergency Medicine

## 2018-10-15 ENCOUNTER — Other Ambulatory Visit: Payer: Self-pay

## 2018-10-15 DIAGNOSIS — R531 Weakness: Secondary | ICD-10-CM | POA: Diagnosis present

## 2018-10-15 DIAGNOSIS — I509 Heart failure, unspecified: Secondary | ICD-10-CM | POA: Diagnosis not present

## 2018-10-15 DIAGNOSIS — I110 Hypertensive heart disease with heart failure: Secondary | ICD-10-CM | POA: Insufficient documentation

## 2018-10-15 DIAGNOSIS — K5900 Constipation, unspecified: Secondary | ICD-10-CM

## 2018-10-15 HISTORY — DX: Chronic obstructive pulmonary disease, unspecified: J44.9

## 2018-10-15 LAB — URINALYSIS, COMPLETE (UACMP) WITH MICROSCOPIC
BILIRUBIN URINE: NEGATIVE
Glucose, UA: NEGATIVE mg/dL
Hgb urine dipstick: NEGATIVE
Ketones, ur: NEGATIVE mg/dL
Leukocytes, UA: NEGATIVE
Nitrite: NEGATIVE
Protein, ur: 30 mg/dL — AB
Specific Gravity, Urine: 1.021 (ref 1.005–1.030)
pH: 5 (ref 5.0–8.0)

## 2018-10-15 LAB — COMPREHENSIVE METABOLIC PANEL
ALT: 26 U/L (ref 0–44)
AST: 25 U/L (ref 15–41)
Albumin: 3.7 g/dL (ref 3.5–5.0)
Alkaline Phosphatase: 64 U/L (ref 38–126)
Anion gap: 8 (ref 5–15)
BUN: 30 mg/dL — ABNORMAL HIGH (ref 8–23)
CO2: 29 mmol/L (ref 22–32)
Calcium: 9 mg/dL (ref 8.9–10.3)
Chloride: 104 mmol/L (ref 98–111)
Creatinine, Ser: 1.25 mg/dL — ABNORMAL HIGH (ref 0.44–1.00)
GFR calc Af Amer: 45 mL/min — ABNORMAL LOW (ref >60)
GFR calc non Af Amer: 39 mL/min — ABNORMAL LOW (ref >60)
GLUCOSE: 101 mg/dL — AB (ref 70–99)
Potassium: 4.3 mmol/L (ref 3.5–5.1)
SODIUM: 141 mmol/L (ref 135–145)
Total Bilirubin: 0.4 mg/dL (ref 0.3–1.2)
Total Protein: 6.2 g/dL — ABNORMAL LOW (ref 6.5–8.1)

## 2018-10-15 LAB — CBC WITH DIFFERENTIAL/PLATELET
Abs Immature Granulocytes: 0.03 10*3/uL (ref 0.00–0.07)
Basophils Absolute: 0 10*3/uL (ref 0.0–0.1)
Basophils Relative: 0 %
Eosinophils Absolute: 0.1 10*3/uL (ref 0.0–0.5)
Eosinophils Relative: 1 %
HCT: 34.4 % — ABNORMAL LOW (ref 36.0–46.0)
Hemoglobin: 11.3 g/dL — ABNORMAL LOW (ref 12.0–15.0)
Immature Granulocytes: 1 %
Lymphocytes Relative: 15 %
Lymphs Abs: 0.9 10*3/uL (ref 0.7–4.0)
MCH: 28.3 pg (ref 26.0–34.0)
MCHC: 32.8 g/dL (ref 30.0–36.0)
MCV: 86.2 fL (ref 80.0–100.0)
MONO ABS: 0.4 10*3/uL (ref 0.1–1.0)
Monocytes Relative: 7 %
NEUTROS ABS: 4.9 10*3/uL (ref 1.7–7.7)
Neutrophils Relative %: 76 %
Platelets: 198 10*3/uL (ref 150–400)
RBC: 3.99 MIL/uL (ref 3.87–5.11)
RDW: 15.9 % — ABNORMAL HIGH (ref 11.5–15.5)
WBC: 6.4 10*3/uL (ref 4.0–10.5)
nRBC: 0 % (ref 0.0–0.2)

## 2018-10-15 LAB — TROPONIN I: Troponin I: <0.03 ng/mL (ref <0.03)

## 2018-10-15 NOTE — Discharge Instructions (Addendum)
Constipation: Take colace twice a day everyday. Take senna once a day at bedtime. Take daily probiotics. Drink plenty of fluids and eat a diet rich in fiber. If you go more than 3 days without a bowel movement, take 1 cap full of Miralax in the morning and one in the evening up to 5 days.  ° °

## 2018-10-15 NOTE — ED Notes (Signed)
Patient transported to X-ray 

## 2018-10-15 NOTE — ED Provider Notes (Signed)
[facility] Emergency Department Provider Note       Time seen: ----------------------------------------- 1:44 PM on 10/15/2018 -----------------------------------------   I have reviewed the triage vital signs and the nursing notes.  HISTORY   Chief Complaint No chief complaint on file.    HPI Rose [name] is a 84 y.o. female with a history of anemia, CHF, GERD, hyperlipidemia, hypertension, sleep apnea who presents to the ED for weakness.  Patient states she comes from home, has been weak for the past 3 weeks.  She has had increased swelling in both legs.  She has intermittently required higher dose of Lasix for same.  She is on 3 L nasal cannula for chronic CHF.  She has stroke in April with right-sided weakness.  She describes shortness of breath is worse with lying down and exertion  Past Medical History:  Diagnosis Date  . Allergic rhinitis   . Anemia   . Arrhythmia    s/p ablation for A-fib  . Carpal tunnel syndrome   . CHF (congestive heart failure) (HCC)   . GERD (gastroesophageal reflux disease)   . Heart murmur   . Hyperlipidemia   . Hypertension   . Lumbar spine pain   . Nephrolithiasis   . Osteoarthritis   . Pre-diabetes   . Sleep apnea   . TIA (transient ischemic attack)     Patient Active Problem List   Diagnosis Date Noted  . Carotid stenosis, symptomatic, with infarction (HCC) 03/07/2018  . CVA (cerebral vascular accident) (HCC) 02/18/2018  . Sensory deficit, right 02/17/2018  . COPD (chronic obstructive pulmonary disease) (HCC) 12/19/2017  . Renal artery stenosis (HCC) 12/06/2017  . Bilateral carotid artery stenosis 12/06/2017  . COPD with acute exacerbation (HCC) 02/01/2017  . Chronic diastolic heart failure (HCC) 09/23/2016  . GERD (gastroesophageal reflux disease) 08/17/2016  . Headache 08/17/2016  . History of PSVT (paroxysmal supraventricular tachycardia) 08/17/2016  . OA (osteoarthritis) 08/17/2016  . OSA  (obstructive sleep apnea) 08/17/2016  . TIA (transient ischemic attack) 08/17/2016  . Risk for falls 07/27/2016  . Diverticulitis of large intestine without perforation or abscess without bleeding 04/16/2015  . SOB (shortness of breath) 08/05/2011  . HTN (hypertension) 08/05/2011  . Hyperlipidemia 08/05/2011  . PVD (peripheral vascular disease) (HCC) 08/05/2011  . CAD (coronary artery disease) 08/05/2011    Past Surgical History:  Procedure Laterality Date  . ABDOMINAL HYSTERECTOMY    . ablation  January 2011   A-fib  . BACK SURGERY    . CARDIAC CATHETERIZATION    . ENDARTERECTOMY Right 03/07/2018   Procedure: ENDARTERECTOMY CAROTID;  Surgeon: Annice Needyew, Jason S, MD;  Location: [facility] ORS;  Service: Vascular;  Laterality: Right;  . ESOPHAGOGASTRODUODENOSCOPY N/A 05/22/2015   Procedure: ESOPHAGOGASTRODUODENOSCOPY (EGD);  Surgeon: Scot Junobert T Elliott, MD;  [facility];  Service: Endoscopy;  Laterality: N/A;  . RENAL ARTERY STENT    . REPLACEMENT TOTAL KNEE BILATERAL    . SAVORY DILATION N/A 05/22/2015   Procedure: SAVORY DILATION;  Surgeon: Scot Junobert T Elliott, MD;  [facility];  Service: Endoscopy;  Laterality: N/A;  . TEE WITHOUT CARDIOVERSION N/A 05/26/2017   Procedure: TRANSESOPHAGEAL ECHOCARDIOGRAM (TEE);  Surgeon: Alwyn Peaallwood, Dwayne D, MD;  Location: [facility] ORS;  Service: Cardiovascular;  Laterality: N/A;  . TEE WITHOUT CARDIOVERSION N/A 02/20/2018   Procedure: TRANSESOPHAGEAL ECHOCARDIOGRAM (TEE);  Surgeon: Dalia HeadingFath, Kenneth A, MD;  Location: [facility] ORS;  Service: Cardiovascular;  Laterality: N/A;  . TONSILLECTOMY      Allergies Celecoxib; Avapro [irbesartan]; Lisinopril;  Plavix [clopidogrel bisulfate]; Statins; and Ultram [tramadol hcl]  Social History Social History   Tobacco Use  . Smoking status: Never Smoker  . Smokeless tobacco: Never Used  Substance Use Topics  . Alcohol use: No  . Drug use: No    Review of Systems Constitutional: Negative for  fever. Cardiovascular: Negative for chest pain. Respiratory: Positive for shortness of breath Gastrointestinal: Negative for abdominal pain, vomiting and diarrhea. Genitourinary: Negative for dysuria. Musculoskeletal: Negative for back pain. Skin: Negative for rash. Neurological: Positive for generalized weakness  All systems negative/normal/unremarkable except as stated in the HPI  ____________________________________________   PHYSICAL EXAM:  VITAL SIGNS: ED Triage Vitals  Enc Vitals Group     BP      Pulse      Resp      Temp      Temp src      SpO2      Weight      Height      Head Circumference      Peak Flow      Pain Score      Pain Loc      Pain Edu?      Excl. in GC?    Constitutional: Alert and oriented. Well appearing and in no distress. Eyes: Conjunctivae are normal. Normal extraocular movements. ENT   Head: Normocephalic and atraumatic.   Nose: No congestion/rhinnorhea.   Mouth/Throat: Mucous membranes are moist.   Neck: No stridor. Cardiovascular: Normal rate, regular rhythm. No murmurs, rubs, or gallops. Respiratory: Normal respiratory effort without tachypnea nor retractions. Breath sounds are clear and equal bilaterally. No wheezes/rales/rhonchi. Gastrointestinal: Soft and nontender. Normal bowel sounds Musculoskeletal: Nontender with normal range of motion in extremities.  Bilateral edema is noted Neurologic:  Normal speech and language. No gross focal neurologic deficits are appreciated.  Skin:  Skin is warm, dry and intact. No rash noted. Psychiatric: Mood and affect are normal. Speech and behavior are normal.  ____________________________________________  EKG: Interpreted by me.  Atrial fibrillation with a rate of 78 bpm, narrow QRS, normal QT  ____________________________________________  ED COURSE:  As part of my medical decision making, I reviewed the following data within the electronic MEDICAL RECORD NUMBER History obtained from  family if available, nursing notes, old chart and ekg, as well as notes from prior ED visits. Patient presented for weakness, we will assess with labs and imaging as indicated at this time.   Procedures ____________________________________________   LABS (pertinent positives/negatives)  Labs Reviewed  CBC WITH DIFFERENTIAL/PLATELET - Abnormal; Notable for the following components:      Result Value   Hemoglobin 11.3 (*)    HCT 34.4 (*)    RDW 15.9 (*)    All other components within normal limits  COMPREHENSIVE METABOLIC PANEL - Abnormal; Notable for the following components:   Glucose, Bld 101 (*)    BUN 30 (*)    Creatinine, Ser 1.25 (*)    Total Protein 6.2 (*)    GFR calc non Af Amer 39 (*)    GFR calc Af Amer 45 (*)    All other components within normal limits  TROPONIN I  URINALYSIS, COMPLETE (UACMP) WITH MICROSCOPIC  CBG MONITORING, ED    RADIOLOGY  Chest x-ray is pending  ____________________________________________  DIFFERENTIAL DIAGNOSIS   Dehydration, electrolyte abnormality, occult infection, medication side effect, congestive heart failure  FINAL ASSESSMENT AND PLAN  Weakness   Plan: The patient had presented for subacute generalized weakness edema. Patient's labs this  point have been unremarkable. Patient's imaging is still pending at this time.  No clear etiology for weakness for the past 3 weeks.   Ulice Dash, MD   Note: This note was generated in part or whole with voice recognition software. Voice recognition is usually quite accurate but there are transcription errors that can and very often do occur. I apologize for any typographical errors that were not detected and corrected.     Emily Filbert, MD 10/15/18 [phone]

## 2018-10-15 NOTE — ED Triage Notes (Signed)
Pt to ED via EMS from home c/o weakness x3 weeks, denies pain, pt states increased swelling in bilateral legs.  Hx of COPD and wears 3L [place] chronic, CHF, and stroke back in April with right side weakness.  EMS vitals 132/90 BP, 78 HR, 96% 3L [place], 143 CBG.  Pt presents A&Ox4, speaking in complete and coherent sentences, chest rise even and unlabored while lying but states SOB with lying flat and exertion.

## 2018-10-15 NOTE — ED Provider Notes (Signed)
-----------------------------------------   6:40 PM on 10/15/2018 -----------------------------------------   Blood pressure 140/76, pulse 84, temperature (!) 97.5 F (36.4 C), temperature source Oral, resp. rate 18, height 5\' 4"  (1.626 m), weight 82.6 kg, SpO2 99 %.  Assuming care from Dr. Theresa MulliganWilliamsWilliams of Tresa ResMaggie G Yzaguirre is a 84 y.o. female with a chief complaint of Weakness .    Please refer to H&P by previous MD for further details.  The current plan of care is to f/u CXR and UA.  Chest x-ray and UA with no acute findings.  Patient was complained of constipation.  KUB shows moderate stool burden.  Discussed Colace senna standing and MiraLAX as needed with patient and her family.  Discussed standard return precautions and close follow-up with primary care doctor.  Patient be discharged home per recommendations of Dr. Mayford KnifeWilliams.        Nita SickleVeronese, [place], MD 10/15/18 [phone]

## 2018-10-18 LAB — URINE CULTURE: Culture: >=100000 — AB

## 2018-10-19 NOTE — Consult Note (Signed)
ED Antimicrobial Stewardship Positive Culture Follow Up   Rose ResMaggie G Fulwider is an 84 y.o. female who [facility] 10[facility]15/2018 with a chief complaint of  Chief Complaint  Patient presents with  . Weakness    Recent Results (from the past 720 hour(s))  Urine Culture     Status: Abnormal   Collection Time: 10/15/18  4:33 PM  Result Value Ref Range Status   Specimen Description   Final    URINE, RANDOM Performed at [facility], [address] [place], [place] [address]    Special Requests   Final    NONE Performed at [facility], [address] [place], [place] [address]    Culture >=100,000 COLONIES/mL ESCHERICHIA COLI (A)  Final   Report Status 10/18/2018 FINAL  Final   Organism ID, Bacteria ESCHERICHIA COLI (A)  Final      Susceptibility   Escherichia coli - MIC*    AMPICILLIN 4 SENSITIVE Sensitive     CEFAZOLIN <=4 SENSITIVE Sensitive     CEFTRIAXONE <=1 SENSITIVE Sensitive     CIPROFLOXACIN <=0.25 SENSITIVE Sensitive     GENTAMICIN <=1 SENSITIVE Sensitive     IMIPENEM <=0.25 SENSITIVE Sensitive     NITROFURANTOIN <=16 SENSITIVE Sensitive     TRIMETH/SULFA <=20 SENSITIVE Sensitive     AMPICILLIN/SULBACTAM <=2 SENSITIVE Sensitive     PIP/TAZO <=4 SENSITIVE Sensitive     Extended ESBL NEGATIVE Sensitive     * >=100,000 COLONIES/mL ESCHERICHIA COLI    [x]  Patient discharged originally without antimicrobial agent and treatment is now indicated  New antibiotic prescription: Cephalexin 500mg  bid for 5 days   Called in to [facility] to [place] - pt aware   ED Provider: Wallace CullensPhillip Stafford  Kameah Rawl M Deontay Ladnier, PharmD, BCPS Clinical Pharmacist 10/19/2018 4:57 PM;  Monday - Friday phone -  [phone] Saturday - Sunday phone - [phone]

## 2018-12-06 ENCOUNTER — Encounter (INDEPENDENT_AMBULATORY_CARE_PROVIDER_SITE_OTHER): Payer: Medicare HMO

## 2018-12-06 ENCOUNTER — Ambulatory Visit (INDEPENDENT_AMBULATORY_CARE_PROVIDER_SITE_OTHER): Payer: Medicare HMO | Admitting: Vascular Surgery

## 2018-12-14 ENCOUNTER — Telehealth (INDEPENDENT_AMBULATORY_CARE_PROVIDER_SITE_OTHER): Payer: Self-pay | Admitting: Nurse Practitioner

## 2018-12-15 DEATH — deceased

## 2019-09-12 IMAGING — MR MR HEAD W/O CM
8 of 11 series · 34 of 48 positions shown · non-contrast
Comparison: 02/19/2018 CT and CTA of the head. 02/18/2018 CTA neck.

CLINICAL DATA: 88 y/o F; episode of right upper extremity sensory
deficit, weakness, slurred speech.

EXAM:
MRI HEAD WITHOUT CONTRAST
TECHNIQUE: Multiplanar, multiecho pulse sequences of the brain and surrounding
structures were obtained without intravenous contrast.

[Series 2: GRE · sagittal · 5.0mm · 0.45mm/px · 3 of 22 slices shown]
[im 1/22]
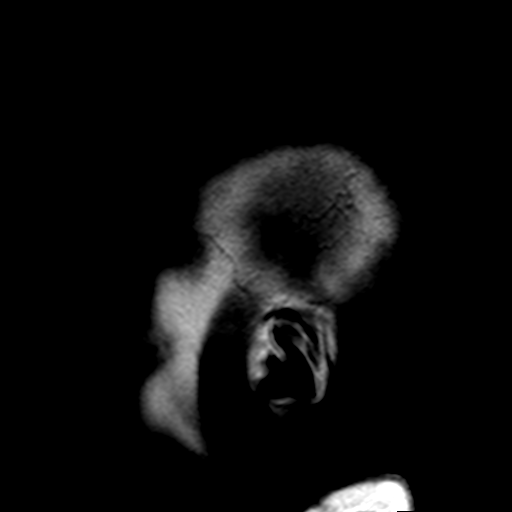
[im 11/22]
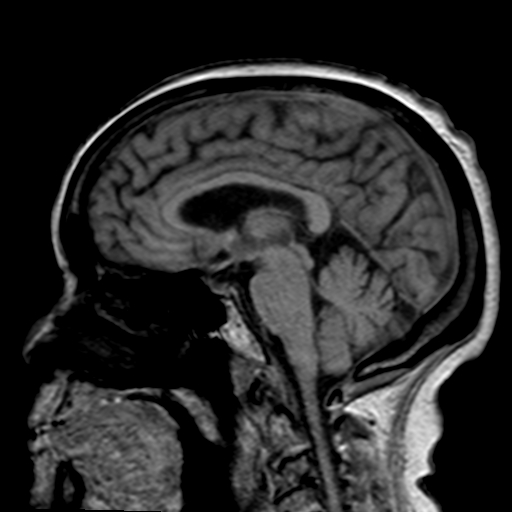
[im 22/22]
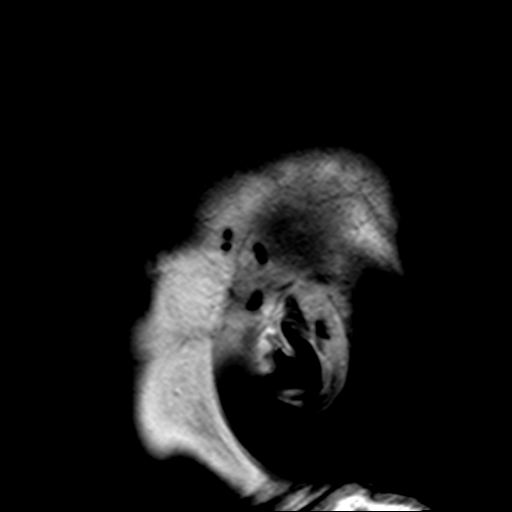

[Series 4: DWI · axial · 3.0mm · 1.80mm/px · z∈[-92,+54]mm · 6 of 49 slices shown (1 of 2)]
[im 1/49]
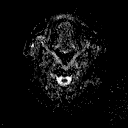
[im 10/49]
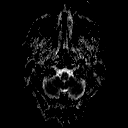
[im 20/49]
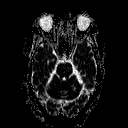
[im 29/49]
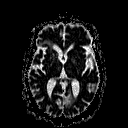
[im 39/49]
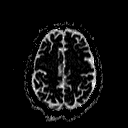
[im 49/49]
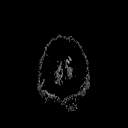

[Series 6: DWI · coronal · 3.0mm · 1.80mm/px · 6 of 44 slices shown (2 of 2)]
[im 1/44]
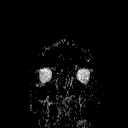
[im 9/44]
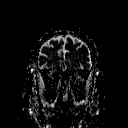
[im 18/44]
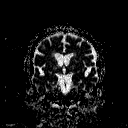
[im 26/44]
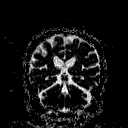
[im 35/44]
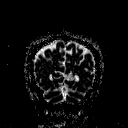
[im 44/44]
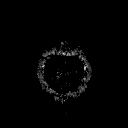

[Series 7: T2 · axial · 5.0mm · 0.45mm/px · z∈[-92,+49]mm · 3 of 23 slices shown (1 of 3)]
[im 1/23]
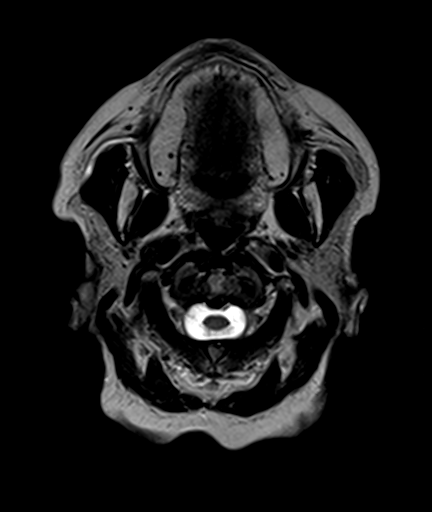
[im 12/23]
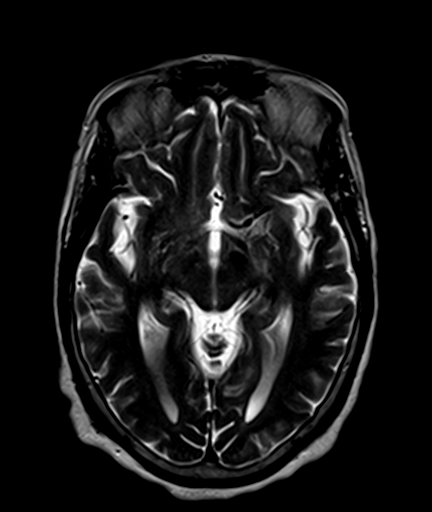
[im 23/23]
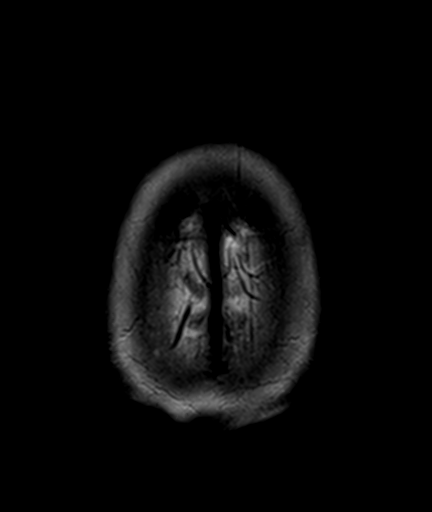

[Series 8: FLAIR · axial · 3.0mm · 0.45mm/px · z∈[-99,+55]mm · 7 of 53 slices shown (1 of 2)]
[im 1/53]
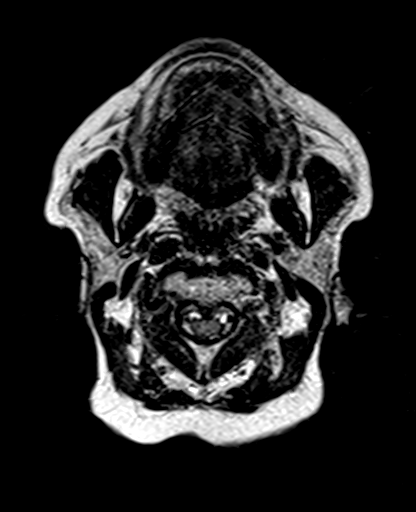
[im 9/53]
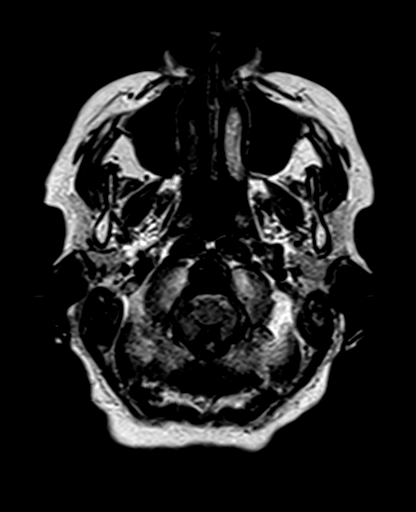
[im 18/53]
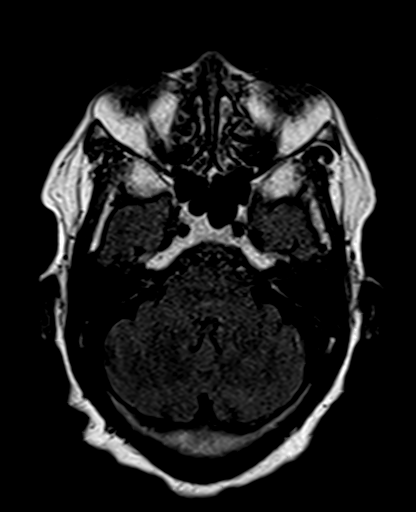
[im 27/53]
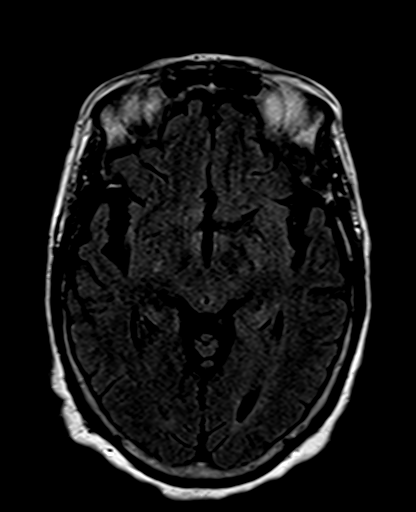
[im 35/53]
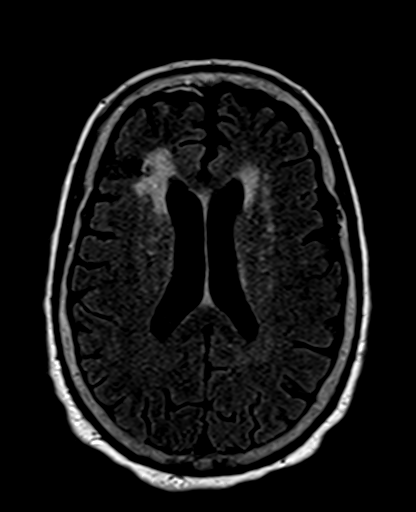
[im 44/53]
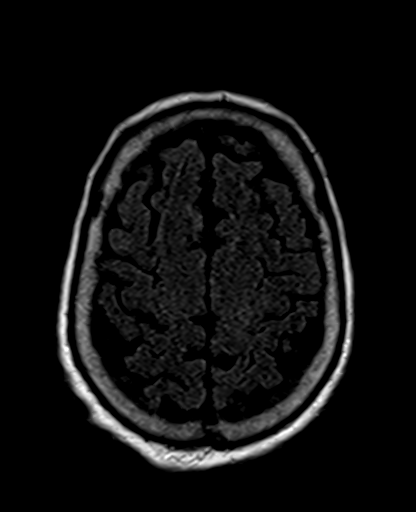
[im 53/53]
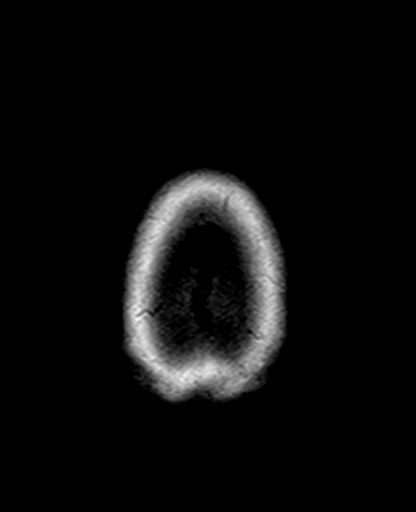

[Series 9: T2 · axial · 5.0mm · 1.20mm/px · z∈[-90,+52]mm · 3 of 23 slices shown (2 of 3)]
[im 1/23]
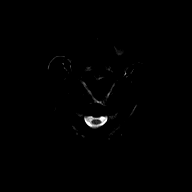
[im 12/23]
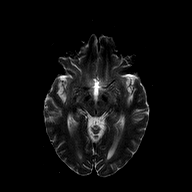
[im 23/23]
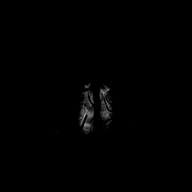

[Series 11: T2 · coronal · 5.0mm · 0.45mm/px · 3 of 26 slices shown (3 of 3)]
[im 1/26]
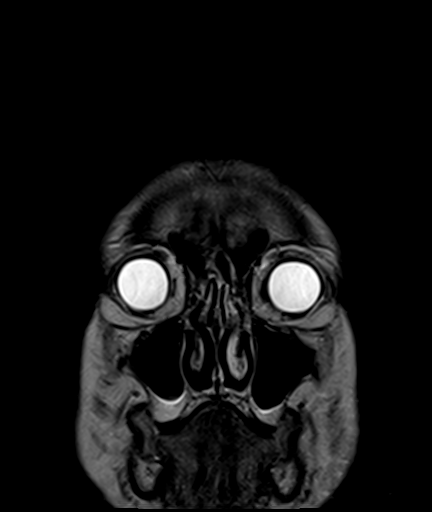
[im 13/26]
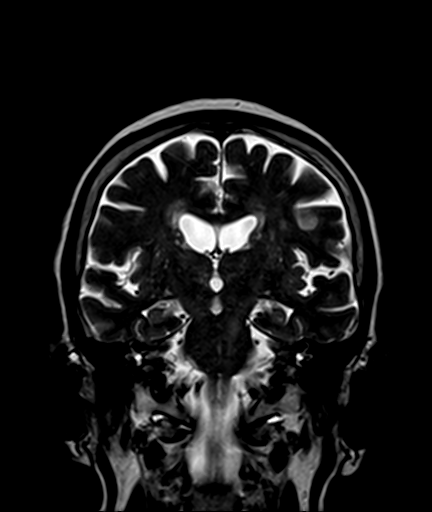
[im 26/26]
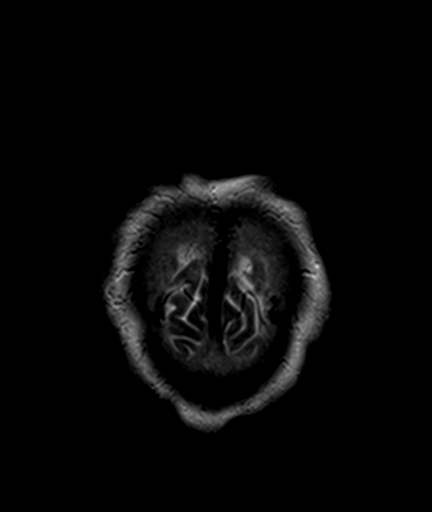

[Series 12: FLAIR · sagittal · 5.0mm · 0.43mm/px · 3 of 21 slices shown (2 of 2)]
[im 1/21]
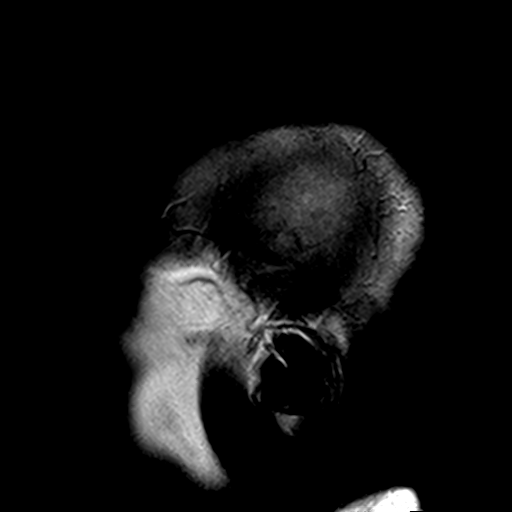
[im 11/21]
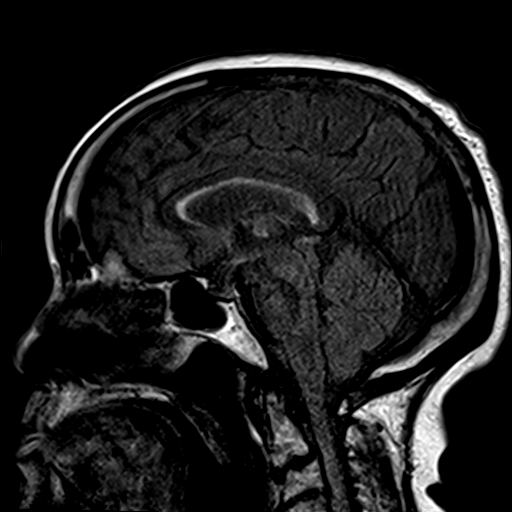
[im 21/21]
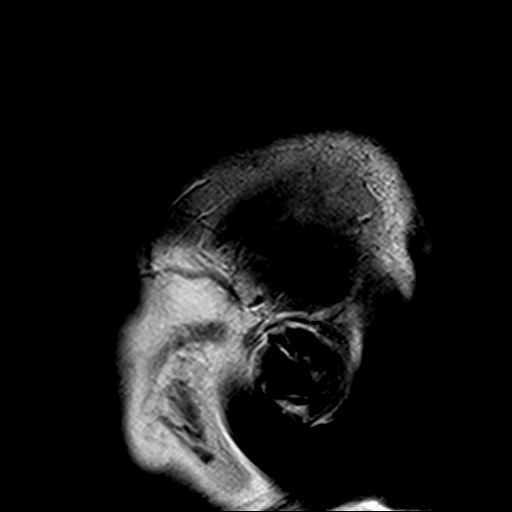

[34 of 48 positions shown; findings below may reference images not displayed]

FINDINGS: Brain: Subcentimeter foci of reduced diffusion are present within
the left anterior insula and left anterior corona radiata compatible
with acute/early subacute infarction. No associated hemorrhage or
mass effect.

Very small chronic infarctions are present in the left superior
cerebellar hemisphere and left hemi pons. Small chronic cortical
infarct in the right anterolateral frontal lobe and left medial
occipital lobe. Severalnonspecific foci of T2 FLAIR hyperintense
signal abnormality in subcortical and periventricular white matter
are compatible withmildchronic microvascular ischemic changes for
age. Mildbrain parenchymal volume loss.

Vascular: Normal flow voids.

Skull and upper cervical spine: Normal marrow signal.

Sinuses/Orbits: Negative.

Other: Bilateral intra-ocular lens replacement.
IMPRESSION: 1. Subcentimeter foci of acute/early subacute infarction are present
in left anterior insula and left anterior corona radiata. No
associated hemorrhage or mass effect.
2. Small chronic infarctions are present in the right inferolateral
frontal cortex, left hemi pons, and left superior cerebellar
hemisphere.
3. Background of mild for age chronic microvascular ischemic changes
and parenchymal volume loss of the brain.

These results will be called to the ordering clinician or
representative by the Radiologist Assistant, and communication
documented in the PACS or zVision Dashboard.

By: Wess Dao M.D.
